# Patient Record
Sex: Female | Born: 1950 | Race: Black or African American | Hispanic: No | Marital: Married | Smoking: Former smoker
Health system: Southern US, Community
[De-identification: ages and names within clinical notes are randomized; demographics above are authoritative.]

## PROBLEM LIST (undated history)

## (undated) DIAGNOSIS — I10 Essential (primary) hypertension: Secondary | ICD-10-CM

## (undated) DIAGNOSIS — R7302 Impaired glucose tolerance (oral): Secondary | ICD-10-CM

## (undated) DIAGNOSIS — G4730 Sleep apnea, unspecified: Secondary | ICD-10-CM

## (undated) DIAGNOSIS — E039 Hypothyroidism, unspecified: Secondary | ICD-10-CM

## (undated) DIAGNOSIS — K219 Gastro-esophageal reflux disease without esophagitis: Secondary | ICD-10-CM

## (undated) DIAGNOSIS — K449 Diaphragmatic hernia without obstruction or gangrene: Secondary | ICD-10-CM

## (undated) DIAGNOSIS — R011 Cardiac murmur, unspecified: Secondary | ICD-10-CM

## (undated) DIAGNOSIS — F419 Anxiety disorder, unspecified: Secondary | ICD-10-CM

## (undated) DIAGNOSIS — M109 Gout, unspecified: Secondary | ICD-10-CM

## (undated) HISTORY — DX: Impaired glucose tolerance (oral): R73.02

## (undated) HISTORY — DX: Hypothyroidism, unspecified: E03.9

---

## 1997-10-09 ENCOUNTER — Emergency Department (HOSPITAL_COMMUNITY): Admission: EM | Admit: 1997-10-09 | Discharge: 1997-10-09 | Payer: Self-pay

## 1997-10-09 ENCOUNTER — Emergency Department (HOSPITAL_COMMUNITY): Admission: EM | Admit: 1997-10-09 | Discharge: 1997-10-09 | Payer: Self-pay | Admitting: Internal Medicine

## 1997-12-05 ENCOUNTER — Emergency Department (HOSPITAL_COMMUNITY): Admission: EM | Admit: 1997-12-05 | Discharge: 1997-12-05 | Payer: Self-pay

## 1998-07-29 ENCOUNTER — Encounter: Admission: RE | Admit: 1998-07-29 | Discharge: 1998-07-29 | Payer: Self-pay | Admitting: Internal Medicine

## 1998-10-06 ENCOUNTER — Emergency Department (HOSPITAL_COMMUNITY): Admission: EM | Admit: 1998-10-06 | Discharge: 1998-10-06 | Payer: Self-pay | Admitting: Internal Medicine

## 1998-10-21 ENCOUNTER — Emergency Department (HOSPITAL_COMMUNITY): Admission: EM | Admit: 1998-10-21 | Discharge: 1998-10-22 | Payer: Self-pay | Admitting: Emergency Medicine

## 1998-10-28 ENCOUNTER — Emergency Department (HOSPITAL_COMMUNITY): Admission: EM | Admit: 1998-10-28 | Discharge: 1998-10-28 | Payer: Self-pay | Admitting: Emergency Medicine

## 1998-11-05 ENCOUNTER — Encounter: Admission: RE | Admit: 1998-11-05 | Discharge: 1998-11-05 | Payer: Self-pay | Admitting: Internal Medicine

## 1998-12-15 ENCOUNTER — Encounter: Admission: RE | Admit: 1998-12-15 | Discharge: 1998-12-15 | Payer: Self-pay | Admitting: Internal Medicine

## 1998-12-26 ENCOUNTER — Encounter: Admission: RE | Admit: 1998-12-26 | Discharge: 1998-12-26 | Payer: Self-pay | Admitting: Internal Medicine

## 1999-01-08 ENCOUNTER — Emergency Department (HOSPITAL_COMMUNITY): Admission: EM | Admit: 1999-01-08 | Discharge: 1999-01-08 | Payer: Self-pay

## 1999-01-12 ENCOUNTER — Ambulatory Visit (HOSPITAL_COMMUNITY): Admission: RE | Admit: 1999-01-12 | Discharge: 1999-01-12 | Payer: Self-pay

## 1999-01-19 ENCOUNTER — Emergency Department (HOSPITAL_COMMUNITY): Admission: EM | Admit: 1999-01-19 | Discharge: 1999-01-19 | Payer: Self-pay | Admitting: Emergency Medicine

## 1999-01-21 ENCOUNTER — Encounter: Admission: RE | Admit: 1999-01-21 | Discharge: 1999-01-21 | Payer: Self-pay | Admitting: Internal Medicine

## 1999-01-28 ENCOUNTER — Encounter: Admission: RE | Admit: 1999-01-28 | Discharge: 1999-01-28 | Payer: Self-pay | Admitting: Hematology and Oncology

## 1999-02-11 ENCOUNTER — Encounter: Admission: RE | Admit: 1999-02-11 | Discharge: 1999-02-11 | Payer: Self-pay | Admitting: Internal Medicine

## 1999-02-13 ENCOUNTER — Encounter: Admission: RE | Admit: 1999-02-13 | Discharge: 1999-02-13 | Payer: Self-pay | Admitting: Internal Medicine

## 1999-03-10 ENCOUNTER — Encounter: Admission: RE | Admit: 1999-03-10 | Discharge: 1999-03-10 | Payer: Self-pay | Admitting: Internal Medicine

## 1999-08-14 ENCOUNTER — Emergency Department (HOSPITAL_COMMUNITY): Admission: EM | Admit: 1999-08-14 | Discharge: 1999-08-14 | Payer: Self-pay | Admitting: Emergency Medicine

## 1999-08-14 ENCOUNTER — Encounter: Payer: Self-pay | Admitting: Emergency Medicine

## 1999-11-06 ENCOUNTER — Encounter: Admission: RE | Admit: 1999-11-06 | Discharge: 1999-11-06 | Payer: Self-pay | Admitting: Internal Medicine

## 2000-01-18 ENCOUNTER — Encounter: Admission: RE | Admit: 2000-01-18 | Discharge: 2000-01-18 | Payer: Self-pay | Admitting: Internal Medicine

## 2000-02-18 ENCOUNTER — Emergency Department (HOSPITAL_COMMUNITY): Admission: EM | Admit: 2000-02-18 | Discharge: 2000-02-18 | Payer: Self-pay | Admitting: Emergency Medicine

## 2000-03-19 ENCOUNTER — Encounter: Payer: Self-pay | Admitting: Emergency Medicine

## 2000-03-19 ENCOUNTER — Emergency Department (HOSPITAL_COMMUNITY): Admission: EM | Admit: 2000-03-19 | Discharge: 2000-03-19 | Payer: Self-pay | Admitting: Emergency Medicine

## 2000-04-11 ENCOUNTER — Encounter: Admission: RE | Admit: 2000-04-11 | Discharge: 2000-04-11 | Payer: Self-pay | Admitting: Internal Medicine

## 2000-04-29 ENCOUNTER — Emergency Department (HOSPITAL_COMMUNITY): Admission: EM | Admit: 2000-04-29 | Discharge: 2000-04-29 | Payer: Self-pay | Admitting: *Deleted

## 2000-05-13 ENCOUNTER — Ambulatory Visit (HOSPITAL_COMMUNITY): Admission: RE | Admit: 2000-05-13 | Discharge: 2000-05-13 | Payer: Self-pay | Admitting: Gastroenterology

## 2000-05-13 ENCOUNTER — Encounter: Payer: Self-pay | Admitting: Gastroenterology

## 2000-05-19 ENCOUNTER — Other Ambulatory Visit: Admission: RE | Admit: 2000-05-19 | Discharge: 2000-05-19 | Payer: Self-pay | Admitting: Obstetrics

## 2000-05-19 ENCOUNTER — Encounter: Admission: RE | Admit: 2000-05-19 | Discharge: 2000-05-19 | Payer: Self-pay | Admitting: Obstetrics

## 2000-05-23 ENCOUNTER — Encounter: Admission: RE | Admit: 2000-05-23 | Discharge: 2000-05-23 | Payer: Self-pay | Admitting: Internal Medicine

## 2000-06-03 ENCOUNTER — Encounter: Admission: RE | Admit: 2000-06-03 | Discharge: 2000-06-03 | Payer: Self-pay | Admitting: Internal Medicine

## 2000-06-09 ENCOUNTER — Ambulatory Visit (HOSPITAL_COMMUNITY): Admission: RE | Admit: 2000-06-09 | Discharge: 2000-06-09 | Payer: Self-pay | Admitting: Gastroenterology

## 2000-07-11 ENCOUNTER — Encounter: Admission: RE | Admit: 2000-07-11 | Discharge: 2000-07-11 | Payer: Self-pay | Admitting: Internal Medicine

## 2000-11-21 ENCOUNTER — Encounter: Admission: RE | Admit: 2000-11-21 | Discharge: 2000-11-21 | Payer: Self-pay | Admitting: Internal Medicine

## 2000-11-28 ENCOUNTER — Encounter: Admission: RE | Admit: 2000-11-28 | Discharge: 2000-11-28 | Payer: Self-pay | Admitting: Internal Medicine

## 2000-12-19 ENCOUNTER — Emergency Department (HOSPITAL_COMMUNITY): Admission: EM | Admit: 2000-12-19 | Discharge: 2000-12-19 | Payer: Self-pay | Admitting: Emergency Medicine

## 2000-12-19 ENCOUNTER — Encounter: Payer: Self-pay | Admitting: Emergency Medicine

## 2000-12-23 ENCOUNTER — Emergency Department (HOSPITAL_COMMUNITY): Admission: EM | Admit: 2000-12-23 | Discharge: 2000-12-23 | Payer: Self-pay | Admitting: Emergency Medicine

## 2001-01-05 ENCOUNTER — Encounter: Admission: RE | Admit: 2001-01-05 | Discharge: 2001-01-05 | Payer: Self-pay | Admitting: Internal Medicine

## 2001-01-21 ENCOUNTER — Emergency Department (HOSPITAL_COMMUNITY): Admission: EM | Admit: 2001-01-21 | Discharge: 2001-01-21 | Payer: Self-pay | Admitting: Emergency Medicine

## 2001-03-01 ENCOUNTER — Encounter: Admission: RE | Admit: 2001-03-01 | Discharge: 2001-03-01 | Payer: Self-pay | Admitting: Internal Medicine

## 2001-03-27 ENCOUNTER — Encounter: Admission: RE | Admit: 2001-03-27 | Discharge: 2001-03-27 | Payer: Self-pay | Admitting: Internal Medicine

## 2001-06-28 ENCOUNTER — Encounter: Admission: RE | Admit: 2001-06-28 | Discharge: 2001-06-28 | Payer: Self-pay | Admitting: Internal Medicine

## 2001-09-15 ENCOUNTER — Encounter: Admission: RE | Admit: 2001-09-15 | Discharge: 2001-09-15 | Payer: Self-pay | Admitting: Internal Medicine

## 2001-09-15 ENCOUNTER — Encounter: Payer: Self-pay | Admitting: Internal Medicine

## 2002-03-22 ENCOUNTER — Emergency Department (HOSPITAL_COMMUNITY): Admission: EM | Admit: 2002-03-22 | Discharge: 2002-03-22 | Payer: Self-pay | Admitting: Emergency Medicine

## 2002-08-07 ENCOUNTER — Encounter: Admission: RE | Admit: 2002-08-07 | Discharge: 2002-08-07 | Payer: Self-pay | Admitting: Internal Medicine

## 2002-08-07 ENCOUNTER — Encounter: Payer: Self-pay | Admitting: Internal Medicine

## 2002-09-12 ENCOUNTER — Encounter: Payer: Self-pay | Admitting: Internal Medicine

## 2002-09-12 ENCOUNTER — Encounter: Admission: RE | Admit: 2002-09-12 | Discharge: 2002-09-12 | Payer: Self-pay | Admitting: Internal Medicine

## 2002-09-17 ENCOUNTER — Emergency Department (HOSPITAL_COMMUNITY): Admission: EM | Admit: 2002-09-17 | Discharge: 2002-09-17 | Payer: Self-pay | Admitting: Emergency Medicine

## 2002-12-17 ENCOUNTER — Emergency Department (HOSPITAL_COMMUNITY): Admission: EM | Admit: 2002-12-17 | Discharge: 2002-12-17 | Payer: Self-pay | Admitting: Emergency Medicine

## 2004-04-22 ENCOUNTER — Ambulatory Visit (HOSPITAL_BASED_OUTPATIENT_CLINIC_OR_DEPARTMENT_OTHER): Admission: RE | Admit: 2004-04-22 | Discharge: 2004-04-22 | Payer: Self-pay | Admitting: Internal Medicine

## 2004-06-23 ENCOUNTER — Ambulatory Visit: Payer: Self-pay | Admitting: Internal Medicine

## 2005-08-25 ENCOUNTER — Emergency Department (HOSPITAL_COMMUNITY): Admission: EM | Admit: 2005-08-25 | Discharge: 2005-08-26 | Payer: Self-pay | Admitting: Emergency Medicine

## 2006-04-08 ENCOUNTER — Emergency Department (HOSPITAL_COMMUNITY): Admission: EM | Admit: 2006-04-08 | Discharge: 2006-04-09 | Payer: Self-pay | Admitting: Emergency Medicine

## 2006-05-31 ENCOUNTER — Ambulatory Visit (HOSPITAL_BASED_OUTPATIENT_CLINIC_OR_DEPARTMENT_OTHER): Admission: RE | Admit: 2006-05-31 | Discharge: 2006-05-31 | Payer: Self-pay | Admitting: Internal Medicine

## 2006-06-05 ENCOUNTER — Ambulatory Visit: Payer: Self-pay | Admitting: Internal Medicine

## 2006-10-04 ENCOUNTER — Emergency Department (HOSPITAL_COMMUNITY): Admission: EM | Admit: 2006-10-04 | Discharge: 2006-10-04 | Payer: Self-pay | Admitting: Family Medicine

## 2006-11-27 ENCOUNTER — Emergency Department (HOSPITAL_COMMUNITY): Admission: EM | Admit: 2006-11-27 | Discharge: 2006-11-27 | Payer: Self-pay | Admitting: Emergency Medicine

## 2006-12-13 ENCOUNTER — Emergency Department (HOSPITAL_COMMUNITY): Admission: EM | Admit: 2006-12-13 | Discharge: 2006-12-13 | Payer: Self-pay | Admitting: Emergency Medicine

## 2007-02-09 ENCOUNTER — Ambulatory Visit (HOSPITAL_COMMUNITY): Admission: RE | Admit: 2007-02-09 | Discharge: 2007-02-09 | Payer: Self-pay | Admitting: Gastroenterology

## 2007-02-10 ENCOUNTER — Encounter: Admission: RE | Admit: 2007-02-10 | Discharge: 2007-02-10 | Payer: Self-pay | Admitting: Gastroenterology

## 2007-03-02 ENCOUNTER — Emergency Department (HOSPITAL_COMMUNITY): Admission: EM | Admit: 2007-03-02 | Discharge: 2007-03-02 | Payer: Self-pay | Admitting: Family Medicine

## 2009-05-12 ENCOUNTER — Emergency Department (HOSPITAL_COMMUNITY): Admission: EM | Admit: 2009-05-12 | Discharge: 2009-05-12 | Payer: Self-pay | Admitting: Emergency Medicine

## 2009-06-14 ENCOUNTER — Emergency Department (HOSPITAL_COMMUNITY): Admission: EM | Admit: 2009-06-14 | Discharge: 2009-06-14 | Payer: Self-pay | Admitting: Emergency Medicine

## 2009-06-19 ENCOUNTER — Emergency Department (HOSPITAL_COMMUNITY): Admission: EM | Admit: 2009-06-19 | Discharge: 2009-06-19 | Payer: Self-pay | Admitting: Family Medicine

## 2009-06-24 ENCOUNTER — Ambulatory Visit: Payer: Self-pay | Admitting: Family Medicine

## 2009-06-24 DIAGNOSIS — R230 Cyanosis: Secondary | ICD-10-CM | POA: Insufficient documentation

## 2009-06-24 DIAGNOSIS — I10 Essential (primary) hypertension: Secondary | ICD-10-CM

## 2009-06-26 ENCOUNTER — Telehealth: Payer: Self-pay | Admitting: Family Medicine

## 2009-07-28 ENCOUNTER — Ambulatory Visit: Payer: Self-pay | Admitting: Family Medicine

## 2009-07-28 DIAGNOSIS — M549 Dorsalgia, unspecified: Secondary | ICD-10-CM | POA: Insufficient documentation

## 2009-07-28 DIAGNOSIS — L299 Pruritus, unspecified: Secondary | ICD-10-CM

## 2009-09-23 ENCOUNTER — Telehealth: Payer: Self-pay | Admitting: *Deleted

## 2009-12-23 ENCOUNTER — Telehealth: Payer: Self-pay | Admitting: *Deleted

## 2009-12-30 ENCOUNTER — Encounter: Payer: Self-pay | Admitting: Family Medicine

## 2009-12-30 ENCOUNTER — Ambulatory Visit: Payer: Self-pay | Admitting: Family Medicine

## 2009-12-30 DIAGNOSIS — F329 Major depressive disorder, single episode, unspecified: Secondary | ICD-10-CM

## 2009-12-30 DIAGNOSIS — F32A Depression, unspecified: Secondary | ICD-10-CM | POA: Insufficient documentation

## 2009-12-30 LAB — CONVERTED CEMR LAB: TSH: 32.38 microintl units/mL — ABNORMAL HIGH (ref 0.350–4.500)

## 2010-01-01 ENCOUNTER — Encounter: Payer: Self-pay | Admitting: Family Medicine

## 2010-02-23 ENCOUNTER — Telehealth: Payer: Self-pay | Admitting: *Deleted

## 2010-02-25 ENCOUNTER — Ambulatory Visit: Payer: Self-pay | Admitting: Family Medicine

## 2010-02-25 DIAGNOSIS — E669 Obesity, unspecified: Secondary | ICD-10-CM

## 2010-03-23 ENCOUNTER — Encounter (INDEPENDENT_AMBULATORY_CARE_PROVIDER_SITE_OTHER): Payer: Self-pay | Admitting: *Deleted

## 2010-04-02 ENCOUNTER — Ambulatory Visit: Payer: Self-pay | Admitting: Family Medicine

## 2010-04-02 DIAGNOSIS — R109 Unspecified abdominal pain: Secondary | ICD-10-CM

## 2010-04-02 LAB — CONVERTED CEMR LAB
Bilirubin Urine: NEGATIVE
Glucose, Urine, Semiquant: NEGATIVE
Pap Smear: NEGATIVE
Urobilinogen, UA: 0.2
WBC Urine, dipstick: NEGATIVE
pH: 5.5

## 2010-04-07 ENCOUNTER — Ambulatory Visit (HOSPITAL_COMMUNITY)
Admission: RE | Admit: 2010-04-07 | Discharge: 2010-04-07 | Payer: Self-pay | Source: Home / Self Care | Admitting: Family Medicine

## 2010-04-09 ENCOUNTER — Encounter: Payer: Self-pay | Admitting: Family Medicine

## 2010-04-09 ENCOUNTER — Ambulatory Visit: Payer: Self-pay | Admitting: Family Medicine

## 2010-04-09 LAB — CONVERTED CEMR LAB
BUN: 16 mg/dL (ref 6–23)
Chloride: 100 meq/L (ref 96–112)
Creatinine, Ser: 0.8 mg/dL (ref 0.40–1.20)
Glucose, Bld: 112 mg/dL — ABNORMAL HIGH (ref 70–99)
Sodium: 139 meq/L (ref 135–145)
TSH: 19.202 microintl units/mL — ABNORMAL HIGH (ref 0.350–4.500)

## 2010-04-10 ENCOUNTER — Encounter: Payer: Self-pay | Admitting: Family Medicine

## 2010-04-10 ENCOUNTER — Ambulatory Visit: Payer: Self-pay | Admitting: Family Medicine

## 2010-04-10 DIAGNOSIS — E039 Hypothyroidism, unspecified: Secondary | ICD-10-CM

## 2010-04-13 ENCOUNTER — Encounter: Payer: Self-pay | Admitting: Family Medicine

## 2010-05-21 ENCOUNTER — Telehealth: Payer: Self-pay | Admitting: Family Medicine

## 2010-05-24 ENCOUNTER — Encounter: Payer: Self-pay | Admitting: Internal Medicine

## 2010-05-24 ENCOUNTER — Encounter: Payer: Self-pay | Admitting: Gastroenterology

## 2010-05-29 ENCOUNTER — Encounter: Payer: Self-pay | Admitting: *Deleted

## 2010-06-02 NOTE — Assessment & Plan Note (Signed)
Summary: multiple issues,df   Vital Signs:  Patient profile:   60 year old female Weight:      252 pounds Temp:     98.5 degrees F oral Pulse rate:   65 / minute Pulse rhythm:   regular BP sitting:   139 / 89  (left arm) Cuff size:   large  Vitals Entered By: Loralee Pacas CMA (December 30, 2009 3:29 PM) CC: multiple issues, Back Pain, Depression   Primary Provider:  Magnus Ivan MD  CC:  multiple issues, Back Pain, and Depression.  History of Present Illness: Patient is a 60 y/o female with obesity. She is suffering from lower back pain. The patient says her back pain is off to the right side of her spine, it does not get worse with palpation of the spine or back muscles. the pain is there when she stands for too long or when she is in a car for prolonged periods of time. She has no fevers, chills or night sweats. she has no shooting pains from her back. she has had no recent trauma.   Back Pain History:      The patient's back pain started approximately 05/02/2009.  The pain is located in the lower back region and does not radiate below the knees.  She states this is not work related.  On a scale of 1-10, she describes the pain as a 5.  She states that she has had a prior history of back pain.  The patient has not had any recent physical therapy for her back pain.  The following makes the back pain better: resting.  The following makes the back pain worse: standing, long car rides.    Depression History:      Positive alarm features for depression include significant weight gain, insomnia, psychomotor retardation, and fatigue (loss of energy).        Risk factors for depression include a recent loss and chronic illness.  The patient denies that she feels like life is not worth living, denies that she wishes that she were dead, and denies that she has thought about ending her life.         Habits & Providers  Alcohol-Tobacco-Diet     Tobacco Status: current     Tobacco  Counseling: to quit use of tobacco products     Cigarette Packs/Day: <0.25  Problems Prior to Update: 1)  Pruritus  (ICD-698.9) 2)  Back Pain, Chronic  (ICD-724.5) 3)  Essential Hypertension, Benign  (ICD-401.1) 4)  Unspecified Disorder of Thyroid  (ICD-246.9) 5)  Cyanosis  (ICD-782.5) 6)  Unspecified Disorder of Skin&subcutaneous Tissue  (ICD-709.9) 7)  Stiffness  (ICD-719.50)  Medications Prior to Update: 1)  Atenolol 50 Mg Tabs (Atenolol) .... Take One Table Two Times A Day 2)  Paroxetine Hcl 40 Mg Tabs (Paroxetine Hcl) .... Take One Pill Once A Day 3)  Synthroid 200 Mcg Tabs (Levothyroxine Sodium) .... Take One Tablet Once A Day 4)  Hydroxyzine Hcl 25 Mg/ml Soln (Hydroxyzine Hcl) .... 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 great aunt with rheumatoid arthritis Family History of Arthritis  Social History: Reviewed history and no changes required. Smoking  Status:  current Packs/Day:  <0.25  Review of Systems       The patient complains of weight gain and muscle weakness.  The patient denies chest pain, syncope, dyspnea on exertion, prolonged cough, headaches, abdominal pain, and unusual weight change.    Physical Exam  General:  Obesehealthy-appearing and good hygiene.   Head:  normocephalic and atraumatic.   Eyes:  pupils reactive to light.   Neck:  supple.   Chest Wall:  no deformities, no tenderness, and no mass.   Lungs:  Normal respiratory effort, chest expands symmetrically. Lungs are clear to auscultation, no crackles or wheezes. Heart:  Normal rate and regular rhythm. S1 and S2  normal without gallop, murmur, click, rub or other extra sounds. Abdomen:  Bowel sounds positive,abdomen soft and non-tender without masses, organomegaly or hernias noted. Msk:  No deformity or scoliosis noted of thoracic or lumbar spine.    Pulses:  R and L carotid,radial,femoral,dorsalis pedis and posterior tibial pulses are full and equal bilaterally Extremities:  No clubbing, cyanosis, edema, or deformity noted with normal full range of motion of all joints.   Neurologic:  No cranial nerve deficits noted. Station and gait are normal. Plantar reflexes are down-going bilaterally. DTRs are symmetrical throughout. Sensory, motor and coordinative functions appear intact. Psych:  Cognition and judgment appear intact. Alert and cooperative with normal attention span and concentration. No apparent delusions, illusions, hallucinations   Impression & Recommendations:  Problem # 1:  BACK PAIN, CHRONIC (ICD-724.5)  Orders: [facility]- Est Level  3 (60454)  Patient advised to lose weight recommended over the counter NSAIDS for pain TSH sent to rule out underlying cause of obesity  Problem # 2:  DEPRESSIVE DISORDER (ICD-311)  Her updated medication list for this problem includes:    Paroxetine Hcl 40 Mg Tabs (Paroxetine hcl) .Marland Kitchen... Take one pill twice a day    Hydroxyzine Hcl 25 Mg/ml Soln (Hydroxyzine hcl) ..... Use q6 hours as needed  Did poorly on her PHQ9. Says she has no drive, is having sleep problems and overeating.  Complete Medication List: 1)  Atenolol 50 Mg Tabs (Atenolol) .... Take one table two times a day 2)  Paroxetine Hcl 40 Mg Tabs (Paroxetine hcl) .... Take one pill twice a day 3)  Synthroid 200 Mcg Tabs (Levothyroxine sodium) .... Take one tablet once a day 4)  Hydroxyzine Hcl 25 Mg/ml Soln (Hydroxyzine hcl) .... Use q6 hours as needed 5)  Lunesta 1 Mg Tabs (Eszopiclone) .... Take one tab at bedtime  Other Orders: TSH-[facility] ([id number])   Patient Instructions: 1)  It was a  pleasure meeting you today. 2)  Please schedule a follow-up appointment in 2 months. 3)  It is important that you exercise regularly at least 20 minutes 5 times a week. If you develop chest pain, have severe difficulty breathing, or feel very tired , stop exercising immediately and seek medical attention. 4)  You need to lose weight. Consider a lower calorie diet and regular exercise.  5)  Take 400-600mg  of Ibuprofen (Advil, Motrin) with food every 4-6 hours as needed for relief of pain or comfort of fever. 6)  Most patients (90%) with low back pain will improve with time (2-6 weeks). Keep active but avoid activities that are painful. Apply moist heat and/or ice to lower back several times a day. 7)  TSH prior to visit, ICD-9: Prescriptions: PAROXETINE HCL 40 MG TABS (PAROXETINE HCL) take one pill twice a day  #60 x 1   Entered  and Authorized by:   Edd Arbour   Signed by:   Edd Arbour on 12/30/2009   Method used:   Printed then faxed to ...         RxID:   [id number] LUNESTA 1 MG TABS (ESZOPICLONE) take one tab at bedtime  #30 x 1   Entered and Authorized by:   Edd Arbour   Signed by:   Edd Arbour on 12/30/2009   Method used:   Print then Give to Patient   RxID:   [id number] PAROXETINE HCL 40 MG TABS (PAROXETINE HCL) take one pill twice a day  #60 x 1   Entered and Authorized by:   Edd Arbour   Signed by:   Edd Arbour on 12/30/2009   Method used:   Print then Give to Patient   RxID:   [id number]

## 2010-06-02 NOTE — Progress Notes (Signed)
Summary: refill  Phone Note Refill Request Call back at Home Phone [phone] Message from:  Patient  Refills Requested: Medication #1:  PAROXETINE HCL 40 MG TABS take one pill once a day  Medication #2:  SYNTHROID 200 MCG TABS take one tablet once a day [facility] [address]  Next Appointment Scheduled: 12/30/09  Follow-up for Phone Call       Follow-up by: Golden Circle RN,  December 23, 2009 10:43 AM    Prescriptions: PAROXETINE HCL 40 MG TABS (PAROXETINE HCL) take one pill once a day  #30 x 0   Entered by:   Golden Circle RN   Authorized by:   Sarah Swaziland MD   Signed by:   Golden Circle RN on 12/23/2009   Method used:   Electronically to        [facility] [address] (retail)       [address]       [place], [place]  [address]       Ph: [phone]       Fax: [phone]   RxID:   [id number] SYNTHROID 200 MCG TABS (LEVOTHYROXINE SODIUM) take one tablet once a day  #30 x 0   Entered by:   Golden Circle RN   Authorized by:   Sarah Swaziland MD   Signed by:   Golden Circle RN on 12/23/2009   Method used:   Electronically to        [facility] [address] (retail)       [address]       [place], [place]  [address]       Ph: [phone]       Fax: [phone]   RxID:   [id number]

## 2010-06-02 NOTE — Assessment & Plan Note (Signed)
Summary: per strother/eo   Vital Signs:  Patient profile:   60 year old female Height:      64.5 inches Weight:      255.1 pounds BMI:     43.27 Temp:     98.3 degrees F oral Pulse rate:   77 / minute BP sitting:   143 / 83  (left arm) Cuff size:   large  Vitals Entered By: Garen Grams LPN (April 02, 2010 10:06 AM) CC: ongoing pressure in pelvic area with frequent urination Is Patient Diabetic? No Pain Assessment Patient in pain? no        Primary Care Oshen Wlodarczyk:  Edd Arbour  CC:  ongoing pressure in pelvic area with frequent urination.  History of Present Illness: Pt complains that she has been having lots of pelvic pressure for several months. She says she feels pressure both in the front of her pelvis and in the back.  It does not radiate anywhere.  She also complains that for about one month she has been having frequent urination, and that at night she has to go about every 10 minutes.  She says there is actually urine each time she has to go.  She does not have pain or burning with urination, but has had vaginal irritation with washing.  She has not had any bleeding since her periods stopped about three years ago.   Pt is a [id number], her last pregnancy was about 15 years ago.  She says she miscarried and had to have a D&C.  She also had to abortions years before. She says that when she had the D&C she had an ultrasound that showed fibroids.   Pt. has also had increasing constipation for the past few months, but no blood in her stool or dark tarry stools.  She has taken milk of magnesia to help with the constipation.    Habits & Providers  Alcohol-Tobacco-Diet     Tobacco Status: current     Tobacco Counseling: to quit use of tobacco products     Cigarette Packs/Day: <0.25  Allergies: No Known Drug Allergies  Review of Systems       See HPI.   Physical Exam  General:  Well-developed,well-nourished,in no acute distress; alert,appropriate and cooperative  throughout examination Rectal:  no external abnormalities.   Genitalia:  Normal introitus for age, no external lesions,small amount of mucous discharge with scant blood, mucosa pink and moist, no vaginal or cervical lesions, no vaginal atrophy, no friaility or hemorrhage, normal uterus size and position, no adnexal masses or tenderness   Impression & Recommendations:  Problem # 1:  PELVIC PAIN, CHRONIC (ICD-789.09) Orders: Ultrasound (Ultrasound) Pap Smear-[facility] ([id number]) [facility]- Est  Level 4 (63016)  Problem # 2:  DYSURIA (ICD-788.1) Orders: Urine Culture-[facility] ([id number]) [facility]- Est  Level 4 (99214)we will get above tests and have her rtc next week for follow up  Complete Medication List: 1)  Atenolol 50 Mg Tabs (Atenolol) .... Take one table two times a day 2)  Paroxetine Hcl 40 Mg Tabs (Paroxetine hcl) .... Take one pill twice a day 3)  Synthroid 200 Mcg Tabs (Levothyroxine sodium) .... Take one tablet once a day 4)  Hydroxyzine Hcl 25 Mg/ml Soln (Hydroxyzine hcl) .... Use q6 hours as needed 5)  Lunesta 1 Mg Tabs (Eszopiclone) .... Take one tab at bedtime 6)  Xenical 120 Mg Caps (Orlistat) .... Take 1 pill at every fatty meal. 7)  Ibuprofen 800 Mg Tabs (Ibuprofen) .... Take 1 tab every  8 hours as needed for pain. eat something with it every time. Urinalysis-[facility] (00000) Ultrasound (Ultrasound) Pap Smear-[facility] ([id number]) Urine Culture-[facility] ([id number]) [facility]- Est  Level 4 (29562)  Patient Instructions: 1)  Please schedukle her back in [facility] next Thursday Dec 9th   Orders Added: 1)  Urinalysis-[facility] [00000] 2)  Ultrasound [Ultrasound] 3)  Pap Smear-[facility] [[id number]] 4)  Urine Culture-[facility] [[id number]] 5)  [facility]- Est  Level 4 [99214]    Laboratory Results   Urine Tests  Date/Time Received: April 02, 2010 10:08 AM  Date/Time Reported: April 02, 2010 10:34 AM   Routine Urinalysis   Color: yellow Appearance: Clear Glucose: negative   (Normal Range:  Negative) Bilirubin: negative   (Normal Range: Negative) Ketone: negative   (Normal Range: Negative) Spec. Gravity: 1.025   (Normal Range: 1.003-1.035) Blood: moderate   (Normal Range: Negative) pH: 5.5   (Normal Range: 5.0-8.0) Protein: negative   (Normal Range: Negative) Urobilinogen: 0.2   (Normal Range: 0-1) Nitrite: negative   (Normal Range: Negative) Leukocyte Esterace: negative   (Normal Range: Negative)  Urine Microscopic WBC/HPF: 0-2 RBC/HPF: 1-5 Bacteria/HPF: trace Mucous/HPF: trace Epithelial/HPF: 0-3    Comments: ...........test performed by...........Marland KitchenTerese Door, CMA       Impression & Recommendations:  Her updated medication list for this problem includes:    Ibuprofen 800 Mg Tabs (Ibuprofen) .Marland Kitchen... Take 1 tab every 8 hours as needed for pain. eat something with it every time.  Orders: Ultrasound (Ultrasound) Pap Smear-[facility] ([id number])   Orders: Ultrasound (Ultrasound) Pap Smear-[facility] ([id number]) [facility]- Est  Level 4 (25956)    Orders: Urine Culture-[facility] ([id number])   Orders: Urine Culture-[facility] ([id number]) [facility]- Est  Level 4 (30160)    Complete Medication List: 1)  Atenolol 50 Mg Tabs (Atenolol) .... Take one table two times a day 2)  Paroxetine Hcl 40 Mg Tabs (Paroxetine hcl) .... Take one pill twice a day 3)  Synthroid 200 Mcg Tabs (Levothyroxine sodium) .... Take one tablet once a day 4)  Hydroxyzine Hcl 25 Mg/ml Soln (Hydroxyzine hcl) .... Use q6 hours as needed 5)  Lunesta 1 Mg Tabs (Eszopiclone) .... Take one tab at bedtime 6)  Xenical 120 Mg Caps (Orlistat) .... Take 1 pill at every fatty meal. 7)  Ibuprofen 800 Mg Tabs (Ibuprofen) .... Take 1 tab every 8 hours as needed for pain. eat something with it every time.  Other Orders: Urinalysis-[facility] (00000)

## 2010-06-02 NOTE — Assessment & Plan Note (Signed)
Summary: shoulder tightness, low back pain, obesity   Vital Signs:  Patient profile:   60 year old female Weight:      252.5 pounds Temp:     98 degrees F oral Pulse rate:   69 / minute Pulse rhythm:   regular BP sitting:   137 / 96  (left arm) Cuff size:   large  Vitals Entered By: Loralee Pacas CMA (February 25, 2010 3:25 PM)  CC: neck pain Pain Assessment Patient in pain? yes     Location: lower back Intensity: 7   Primary Care Provider:  Edd Arbour  CC:  neck pain.  History of Present Illness: Neck pain: Pt has had Rt sided neck pain for the last 2 months. She has tried massage and arthritis pain meds but it has not gotten significantly better. She has not tried a heating pad or NSAID's. She sleeps on her Rt shoulder every night and thinks this may have something to do with it.   Low back pain: This is a chronic issue of 3-4 years. She has low back pain. She has been told that it will decrease with weight loss. Feels like the weight is just "pulling my back forward". Wants to discuss weight loss.  Obesity: Pt wants to try something for weight loss. She wants to discuss Xenical because she has used it successfully int he past. She also wants to hear about diet pills. We discussed that we do not use diet pills at this office but that I was willing to do a trial of Xenical again to see if it can help her lose some weight.   Current Medications (verified): 1)  Atenolol 50 Mg Tabs (Atenolol) .... Take One Table Two Times A Day 2)  Paroxetine Hcl 40 Mg Tabs (Paroxetine Hcl) .... Take One Pill Twice A Day 3)  Synthroid 200 Mcg Tabs (Levothyroxine Sodium) .... Take One Tablet Once A Day 4)  Hydroxyzine Hcl 25 Mg/ml Soln (Hydroxyzine Hcl) .... Use Q6 Hours As Needed 5)  Lunesta 1 Mg Tabs (Eszopiclone) .... Take One Tab At Bedtime 6)  Xenical 120 Mg Caps (Orlistat) .... Take 1 Pill At Every Fatty Meal. 7)  Ibuprofen 800 Mg Tabs (Ibuprofen) .... Take 1 Tab Every 8 Hours As  Needed For Pain. Eat Something With It Every Time.  Allergies (verified): No Known Drug Allergies  Review of Systems        vitals reviewed and pertinent negatives and positives seen in HPI   Physical Exam  General:  Well-developed,well-nourished,in no acute distress; alert,appropriate and cooperative throughout examination, obese Neck:  Rt neck muscles are tight, no tinging or numbness Msk:  PT has some Rt sided low back tenderness to palpation. pt has pain with leaning back but feels fine when leaning forward.    Impression & Recommendations:  Problem # 1:  STIFFNESS (ICD-719.50) Assessment New Shoulder stiffness that I think is coming from her sleeping on her shoulder. SHe is not sleeping well at night and she is suppose to be sleeping with a CPAP on but says she doesn't like it at night. Perhaps she will keep the CPAP on and lie flat in the bed if she were to sleep more deeply. Will try lunesta to help her sleep more soundly.   Orders: [facility]- Est Level  3 (16109)  Problem # 2:  BACK PAIN, CHRONIC (ICD-724.5) Assessment: Unchanged THis is a chronic issue. Pt may have some facet joint arthritis. Could consider an x-ray in the future.  Will havae her f/u with her PCP on this matter. Encouraged weight loss.   Her updated medication list for this problem includes:    Ibuprofen 800 Mg Tabs (Ibuprofen) .Marland Kitchen... Take 1 tab every 8 hours as needed for pain. eat something with it every time.  Orders: [facility]- Est Level  3 (42595)  Problem # 3:  OBESITY, UNSPECIFIED (ICD-278.00) Assessment: Unchanged PT is 252.5 lbs and wants to lose some of the weight. She has used Xenical in the past to lose some weight. Will give her a trial of this medicine again. Warned her that it is expensive  and that if she takes it she needs to come back in for liver function testing in about 1 month. Pt agrees.   Orders: [facility]- Est Level  3 (99213)Future Orders: Hepatic-[facility] ([id number]) ... 02/24/2011  Complete  Medication List: 1)  Atenolol 50 Mg Tabs (Atenolol) .... Take one table two times a day 2)  Paroxetine Hcl 40 Mg Tabs (Paroxetine hcl) .... Take one pill twice a day 3)  Synthroid 200 Mcg Tabs (Levothyroxine sodium) .... Take one tablet once a day 4)  Hydroxyzine Hcl 25 Mg/ml Soln (Hydroxyzine hcl) .... Use q6 hours as needed 5)  Lunesta 1 Mg Tabs (Eszopiclone) .... Take one tab at bedtime 6)  Xenical 120 Mg Caps (Orlistat) .... Take 1 pill at every fatty meal. 7)  Ibuprofen 800 Mg Tabs (Ibuprofen) .... Take 1 tab every 8 hours as needed for pain. eat something with it every time.  Patient Instructions: 1)  Please make an appoinment with Dr. Gerilyn Pilgrim for nutrition consult.  2)  Use the Xenical as needed on your most oily meals.  3)  Come back in 1 month to check liver function tests since you are taking Xenical.  4)  Try to sleep on your back using the  Lunesta and CPAP.  5)  It may improve your shoulder muscle tightness.  6)  Try a heating pad or heating up moist towels (warm) putting them on the neck before bed.  7)  Make an appointment up from at [facility].  Prescriptions: IBUPROFEN 800 MG TABS (IBUPROFEN) take 1 tab every 8 hours as needed for pain. eat something with it every time.  #60 x 1   Entered and Authorized by:   Jamie Brookes MD   Signed by:   Jamie Brookes MD on 02/25/2010   Method used:   Electronically to        [facility] [address] (retail)       [address]       [place], [place]  [id number]       Ph: [phone]       Fax: [phone]   RxID:   [id number] XENICAL 120 MG CAPS (ORLISTAT) take 1 pill at every fatty meal.  #90 x 3   Entered and Authorized by:   Jamie Brookes MD   Signed by:   Jamie Brookes MD on 02/25/2010   Method used:   Print then Give to Patient   RxID:   [id number]    Orders Added: 1)  Hepatic-[facility] [[id number]] 2)  [facility]- Est Level  3 [85462]

## 2010-06-02 NOTE — Progress Notes (Signed)
Summary: Rx Req  Phone Note Call from Patient Call back at Home Phone [phone]   Caller: Patient Summary of Call: Dr. Mayford Knife this pt said you were going to call in several of her meds for her to [facility].   Initial call taken by: Clydell Hakim,  June 26, 2009 3:02 PM  Follow-up for Phone Call        filled prescriptions for atenolol, paroxetine and synthroid Follow-up by: Magnus Ivan MD,  June 27, 2009 11:35 AM    New/Updated Medications: ATENOLOL 50 MG TABS (ATENOLOL) take one table two times a day PAROXETINE HCL 40 MG TABS (PAROXETINE HCL) take one pill once a day SYNTHROID 200 MCG TABS (LEVOTHYROXINE SODIUM) take one tablet once a day Prescriptions: SYNTHROID 200 MCG TABS (LEVOTHYROXINE SODIUM) take one tablet once a day  #30 x 0   Entered and Authorized by:   Magnus Ivan MD   Signed by:   Magnus Ivan MD on 06/27/2009   Method used:   Electronically to        [facility] [address] [address] (retail)       [address]       [place], [place]  [id number]       Ph: [phone]       Fax: [phone]   RxID:   [id number] PAROXETINE HCL 40 MG TABS (PAROXETINE HCL) take one pill once a day  #30 x 2   Entered and Authorized by:   Magnus Ivan MD   Signed by:   Magnus Ivan MD on 06/27/2009   Method used:   Electronically to        [facility] (retail)       [address]       [place], [place]  [address]       Ph: [phone]       Fax: [phone]   RxID:   [id number] ATENOLOL 50 MG TABS (ATENOLOL) take one table two times a day  #60 x 2   Entered and Authorized by:   Magnus Ivan MD   Signed by:   Magnus Ivan MD on 06/27/2009   Method used:   Electronically to        [facility] [address] (retail)       [address]       [place], [place]  [id number]       Ph: [phone]       Fax: [phone]   RxID:   [id number]

## 2010-06-02 NOTE — Progress Notes (Signed)
Summary: refill  Phone Note Refill Request Call back at Home Phone [phone] Message from:  Patient  Refills Requested: Medication #1:  PAROXETINE HCL 40 MG TABS take one pill once a day  Medication #2:  SYNTHROID 200 MCG TABS take one tablet once a day  Medication #3:  HYDROXYZINE HCL 25 MG/ML SOLN use q6 hours as needed.  Medication #4:  ATENOLOL 50 MG TABS take one table two times a day [facility]   Next Appointment Scheduled: 6/9 Initial call taken by: De Nurse,  Sep 23, 2009 2:03 PM  Follow-up for Phone Call       Follow-up by: Golden Circle RN,  Sep 23, 2009 2:22 PM    Prescriptions: HYDROXYZINE HCL 25 MG/ML SOLN (HYDROXYZINE HCL) use q6 hours as needed  #30 x 0   Entered by:   Golden Circle RN   Authorized by:   Magnus Ivan MD   Signed by:   Golden Circle RN on 09/23/2009   Method used:   Electronically to        [facility] (retail)       [address]       [place], [place]  [id number]       Ph: [phone]       Fax: [phone]   RxID:   [id number] SYNTHROID 200 MCG TABS (LEVOTHYROXINE SODIUM) take one tablet once a day  #30 x 0   Entered by:   Golden Circle RN   Authorized by:   Magnus Ivan MD   Signed by:   Golden Circle RN on 09/23/2009   Method used:   Electronically to        [facility] (retail)       [address]       [place], [place]  [id number]       Ph: [phone]       Fax: [phone]   RxID:   [id number] PAROXETINE HCL 40 MG TABS (PAROXETINE HCL) take one pill once a day  #30 x 0   Entered by:   Golden Circle RN   Authorized by:   Magnus Ivan MD   Signed by:   Golden Circle RN on 09/23/2009   Method used:   Electronically to        [facility] (retail)       [address]       [place], [place]  [address]       Ph: [phone]       Fax: [phone]   RxID:   [id number] ATENOLOL 50 MG TABS (ATENOLOL)  take one table two times a day  #60 x 0   Entered by:   Golden Circle RN   Authorized by:   Magnus Ivan MD   Signed by:   Golden Circle RN on 09/23/2009   Method used:   Electronically to        [facility] (retail)       [address]       [place], [place]  [address]       Ph: [phone]       Fax: [phone]   RxID:   [id number]

## 2010-06-02 NOTE — Assessment & Plan Note (Signed)
Summary: f/u tcb   Vital Signs:  Patient profile:   60 year old female Height:      64.5 inches Weight:      247.44 pounds BMI:     41.97 Temp:     98.0 degrees F oral Pulse rate:   76 / minute BP sitting:   124 / 87  (right arm)  Vitals Entered By: Terese Door (July 28, 2009 2:04 PM) CC: F/U Is Patient Diabetic? No Pain Assessment Patient in pain? yes     Location: back Intensity: 7 Type: aching   Primary Care Provider:  Magnus Ivan MD  CC:  F/U.  History of Present Illness: 60 y/o female here c/o back pain and itching  back pain- chronic issue. worse with standing for long periods, increased activity. no numbness, tingling, paresthesias, loss of bowel or bladder continence, difficulty walking. improved with tylenol arthritis. no improvement with icy hot. occasional feels like it "wraps around" to inguinal area. no vaginal discharge, no longer has menstrual cycle. pain can be improved with BM.   itching- improved with hydroxyzine and benadryl, but they cause her to be sleepy. would like alternative.   Habits & Providers  Alcohol-Tobacco-Diet     Tobacco Status: quit < 6 months  Current Medications (verified): 1)  Atenolol 50 Mg Tabs (Atenolol) .... Take One Table Two Times A Day 2)  Paroxetine Hcl 40 Mg Tabs (Paroxetine Hcl) .... Take One Pill Once A Day 3)  Synthroid 200 Mcg Tabs (Levothyroxine Sodium) .... Take One Tablet Once A Day 4)  Hydroxyzine Hcl 25 Mg/ml Soln (Hydroxyzine Hcl) .... Use Q6 Hours As Needed  Allergies (verified): No Known Drug Allergies  Past History:  Past medical history reviewed for relevance to current acute and chronic problems.  Past Medical History: Reviewed history from 06/24/2009 and no changes required. 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 vitals reviewed.  Msk:  mild TTP in area of sacroiliac joints bilaterally. no  paraspinous muscle tenderness. normal gait, heel walking, toe walking. full ROM of spine. pain worse with lateral movement. negative straight leg raise bilaterally.  Neurologic:  alert & oriented X3, cranial nerves II-XII intact, and gait normal.     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 patient given rehab exercises, encouraged to use ibuprofen and/or acetaminophen for pain. f/u in 6 weeks for pelvic to r/o pelvic causes given occasional inguinal pain.   Orders: [facility]- Est Level  3 ([address])  Problem # 2:  PRURITUS (ICD-698.9) Assessment: New  etiology unclear. recommend non-sedating anti-histamine (loratadine, cetirizine, etc).   Orders: [facility]- Est Level  3 ([address])  Complete Medication List: 1)  Atenolol 50 Mg Tabs (Atenolol) .... Take one table two times a day 2)  Paroxetine Hcl 40 Mg Tabs (Paroxetine hcl) .... Take one pill once a day 3)  Synthroid 200 Mcg Tabs (Levothyroxine sodium) .... Take one tablet once a day 4)  Hydroxyzine Hcl 25 Mg/ml Soln (Hydroxyzine hcl) .... Use q6 hours as needed  Patient Instructions: 1)  Follow up in 6 weeks for complete physical and to see how your back is doing.  2)  You can take IBUPROFEN 200 mg tablets- up to 3 tablets every 6  hours for pain 3)  You can take ACETAMINOPHEN (Tylenol) 500 mg- up to 2 tablets every 6 hours.  4)  You can take BOTH (they work differently) 5)  Do the back exercises that I give you.

## 2010-06-02 NOTE — Assessment & Plan Note (Signed)
Summary: f/u eo   Vital Signs:  Patient profile:   60 year old female Height:      64.5 inches Weight:      252.3 pounds BMI:     42.79 Temp:     98.4 degrees F oral Pulse rate:   69 / minute BP sitting:   132 / 90  (right arm) Cuff size:   large  Vitals Entered By: Jimmy Footman, CMA (April 10, 2010 3:35 PM) CC: thyroid follow up   Primary Provider:  Edd Arbour  CC:  thyroid follow up.  History of Present Illness: 1. Hypothyroidism - TSH high 19 - patient has been off synthroid for a few weeks, she just refilled her medication. She will continue to take the 200 micrograms dose for 6 weeks and then return for a recheck of her TSH. She has fatigue, obesity, dry skin - all symptoms of hypothyroidism.  no other signs or symptoms  2. Back pain - ibuprofen causing stomach pain, will switch her to Mobic 7.5 q 6 hrs.  Preventive Screening-Counseling & Management  Alcohol-Tobacco     Smoking Status: current  Allergies: No Known Drug Allergies  Review of Systems       neg. see HPI  Physical Exam  General:  alert, cooperative to examination, and overweight-appearing.   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 Take one tablet once a day  Orders: [facility]- Est Level  3 ([address])  Problem # 2:  BACK PAIN, CHRONIC (ICD-724.5)  The following medications were removed from the medication list:    Ibuprofen 800 Mg Tabs (Ibuprofen) .Marland Kitchen... Take 1 tab every 8 hours as needed for pain. eat something with it every time. Her updated medication list for this problem includes:    Meloxicam 7.5 Mg Tabs (Meloxicam) .Marland Kitchen... Take one tab every 6 hours as needed for pain  Complete Medication List: 1)  Atenolol 50 Mg Tabs (Atenolol) .... Take one table two times a day 2)  Paroxetine Hcl 40 Mg Tabs (Paroxetine hcl) .... Take  one pill twice a day 3)  Synthroid 200 Mcg Tabs (Levothyroxine sodium) .... Take one tablet once a day 4)  Hydroxyzine Hcl 25 Mg/ml Soln (Hydroxyzine hcl) .... Use q6 hours as needed 5)  Lunesta 1 Mg Tabs (Eszopiclone) .... Take one tab at bedtime 6)  Xenical 120 Mg Caps (Orlistat) .... Take 1 pill at every fatty meal. 7)  Meloxicam 7.5 Mg Tabs (Meloxicam) .... Take one tab every 6 hours as needed for pain  Patient Instructions: 1)  follow up in 6 weeks. Prescriptions: MELOXICAM 7.5 MG TABS (MELOXICAM) take one tab every 6 hours as needed for pain  #30 x 5   Entered and Authorized by:   Edd Arbour   Signed by:   Edd Arbour on 04/10/2010   Method used:   Electronically to        [facility] (retail)       [address]       [place], [place]  [id number]       Ph: [phone]       Fax: [phone]   RxID:   [id number]    Orders Added: 1)  [facility]- Est Level  3 [[address]]

## 2010-06-02 NOTE — Letter (Signed)
Summary: Generic Letter  [facility]  [address]   [place], [place] [address]   Phone: [phone]  Fax: [phone]    01/01/2010 MRN: [id number]  [address] [place], [place]  [address]  Dear Ms. MCCOWN,  Your thyroid test came back.  This is the value below, it is high which means you will need to be on thyroid medication.  Tests: (1) TSH (232[address])   TSH                  [H]  32.380 uIU/mL               0.350-4.500   Please come back and see me in my office as soon as possible to start your medications and to discuss your thyroid condition.   Sincerely,   Edd Arbour M.D. [facility]  Appended Document: Generic Letter mailed

## 2010-06-02 NOTE — Assessment & Plan Note (Signed)
Summary: FU/PER DR NEAL/RH   Vital Signs:  Patient profile:   60 year old female Weight:      251 pounds Temp:     98.7 degrees F oral Pulse rate:   69 / minute Pulse rhythm:   regular BP sitting:   139 / 89  (left arm) Cuff size:   large  Vitals Entered By: Loralee Pacas CMA (April 09, 2010 8:41 AM) CC: follow-up visit   Primary Care Provider:  Edd Arbour  CC:  follow-up visit.  History of Present Illness: f/u pelvic pressure Pap and PUS were normal and she is notified of that today. Continues to have urinary frequency, sense of pelvic pressure does admit to some constipation  Current Medications (verified): 1)  Atenolol 50 Mg Tabs (Atenolol) .... Take One Table Two Times A Day 2)  Paroxetine Hcl 40 Mg Tabs (Paroxetine Hcl) .... Take One Pill Twice A Day 3)  Synthroid 200 Mcg Tabs (Levothyroxine Sodium) .... Take One Tablet Once A Day 4)  Hydroxyzine Hcl 25 Mg/ml Soln (Hydroxyzine Hcl) .... Use Q6 Hours As Needed 5)  Lunesta 1 Mg Tabs (Eszopiclone) .... Take One Tab At Bedtime 6)  Xenical 120 Mg Caps (Orlistat) .... Take 1 Pill At Every Fatty Meal. 7)  Ibuprofen 800 Mg Tabs (Ibuprofen) .... Take 1 Tab Every 8 Hours As Needed For Pain. Eat Something With It Every Time.  Allergies: No Known Drug Allergies  Review of Systems  The patient denies fever, weight loss, weight gain, and severe indigestion/heartburn.         Please see HPI for additional ROS.   Physical Exam  General:  alert, well-developed, well-nourished, well-hydrated, and overweight-appearing.   Neck:  supple, no masses, and no thyromegaly.   Abdomen:  soft, non-tender, normal bowel sounds, and no distention.     Impression & Recommendations:  Problem # 1:  PELVIC PAIN, CHRONIC (ICD-789.09)  possibly related to her constipation--we discussed adding fiber, increasing water.   Orders: [facility]- Est  Level 4 ([address])  Problem # 2:  DYSURIA (ICD-788.1)  Urine cx negative so I am left with a  differential of 1) constipation which coule account for pelvic pressure. 2) interstitial cystitis  3) chronic bladder irritation from LARGE amount of soda she takes in. we discussed at length 4) bladder spasm will decrease soda to <12 oz /day, check tsh, glucose and cr as she is due thryoid check anyway f/u pcp--has appt tomorrow. If change in dietary habits etc does not improve, would consider med for bladder spasm  Orders: [facility]- Est  Level 4 ([address])  Complete Medication List: 1)  Atenolol 50 Mg Tabs (Atenolol) .... Take one table two times a day 2)  Paroxetine Hcl 40 Mg Tabs (Paroxetine hcl) .... Take one pill twice a day 3)  Synthroid 200 Mcg Tabs (Levothyroxine sodium) .... Take one tablet once a day 4)  Hydroxyzine Hcl 25 Mg/ml Soln (Hydroxyzine hcl) .... Use q6 hours as needed 5)  Lunesta 1 Mg Tabs (Eszopiclone) .... Take one tab at bedtime 6)  Xenical 120 Mg Caps (Orlistat) .... Take 1 pill at every fatty meal. 7)  Ibuprofen 800 Mg Tabs (Ibuprofen) .... Take 1 tab every 8 hours as needed for pain. eat something with it every time.  Other Orders: Basic [facility] [phone]) TSH-[facility] [phone])  Patient Instructions: 1)  Start OTC fiber replacement at 1/2 dose once a day. Do this every day for a week and increase to half dose. Continue for a  week and each week add 1/4 dose until you are at typical stating dose. stay with that dose, taking once daily.    Orders Added: 1)  Basic [facility] [[id number]] 2)  TSH-[facility] [[id number]] 3)  [facility]- Est  Level 4 [[address]]

## 2010-06-02 NOTE — Letter (Signed)
Summary: Generic Letter  [facility]  [address]   [place], [place] [address]   Phone: [phone]  Fax: [phone]    03/23/2010  DHALIA ZINGARO [address] [place], [place]  [address]  Dear Ms. OZIMEK,      We have been unable to contact you by phone. Your pharmacy requested a refill on your thyroid medication. Dr. Rivka Safer does need to see you to discuss your appropriate dose of thyroid medication  He went ahead and refilled your previous dosage of the synthroid medication  but your dose does need to be adjusted and he must see you in order to do this. Please call our office at your earliest convenience.       Sincerely,   Theresia Lo RN  Appended Document: Generic Letter letter mailed.

## 2010-06-02 NOTE — Assessment & Plan Note (Signed)
Summary: NP,tcb   Vital Signs:  Patient profile:   60 year old female Height:      64.5 inches Weight:      245.8 pounds BMI:     41.69 Temp:     98 degrees F oral Pulse rate:   67 / minute Pulse rhythm:   regular BP sitting:   147 / 91  (right arm) Cuff size:   large  Vitals Entered By: Loralee Pacas CMA (June 24, 2009 10:29 AM)  Primary Care Provider:  Magnus Ivan MD  CC:  NP/cyanotic fingers/stiffness/med refills/flesh pain/thyroid disease.  History of Present Illness: NP:  patient is meeting me for the first time  stiffness: Character: takes a while to get up, hard to loosen up Location: everywhere Onset:  chronic problem but in last 2 months symptoms have been getting worse Symptoms better:  moving around, but not too much which causes pain, tylenol arthritis helps some Symptoms worse:  moving around too much ROS: increased fatigue, no swelling, redness.  no weakness yet pt off balance, not particularly weak Locking: none Red Flags Fever: none  Multiple joints:  yes  Rash: one on back (hydroxizine)  STD exposure: no  cyanotic fingers does report fingertips and toes get blue, not noticed symmetrical movement but index and middle finger afffected and it spares the thumb.    thyroid problems: patient claims that she had iodine treatment for hyperthyroidism then developed hypothyroidism.  refill meds:  paroxetine, syntroid, and atenolol  flesh pain Character: hot needles and putting in flesh inside the flesh, not in bones, muscles? flesh? feels like insect bite Location:   mostly in L upper arm (stomach, under breast), 4 or 5 places Onset:  chronic, worse before and better now, 10-11 years Symptoms better: Lyrica some but makes makes pt sleepy, hydroxizine helps but makes drowsy Symptoms worse: after rubbing painful no radiation   Extra medication:  caltrate 600 D, D-3 1000 International Units 1 tablet, Aciatrim Brailian Diet Aid, 2 caps/day, Fish Oil  1 tab/day   Current Problems (verified): 1)  Essential Hypertension, Benign  (ICD-401.1) 2)  Unspecified Disorder of Thyroid  (ICD-246.9) 3)  Cyanosis  (ICD-782.5) 4)  Unspecified Disorder of Skin&subcutaneous Tissue  (ICD-709.9) 5)  Stiffness  (ICD-719.50)  Current Medications (verified): 1)  Atenolol 50 Mg Tabs (Atenolol) .... Take One Table Two Times A Day 2)  Paroxetine Hcl 40 Mg Tabs (Paroxetine Hcl) .... Take One Pill Once A Day 3)  Synthroid 200 Mcg Tabs (Levothyroxine Sodium) .... Take One Tablet Once A Day 4)  Hydroxyzine Hcl 25 Mg/ml Soln (Hydroxyzine Hcl) .... 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  no cyanosis. left back with erythematous maculopapular lesions palpation of left upper arm reveals palpable lumps which are painful  upon palpation Psych:  memory intact for recent and remote, normally interactive, good eye contact, not anxious appearing, and not depressed appearing.   23  Impression & Recommendations:  Problem # 1:  STIFFNESS (ICD-719.50) Assessment New patient has a history of arthritis and her aunt has rheumatoid arthitis and the fact that this is a chronic problem.  also should consider testing for rheumatoid arthritis  and thyroid after [facility] attained.  need to consider pain management based on test results.   Orders: [facility]- Est  Level 4 ([address])  Problem # 2:  CYANOSIS (ICD-782.5) Assessment: New  patient may have raynaud's based on history.  can be associated with hypothyroid disease and rheumatoid arthritis.  further blood work to be after [facility] card attained  Orders: [facility]- Est  Level 4 ([address])  Problem # 3:  UNSPECIFIED DISORDER OF THYROID (ICD-246.9) Assessment: New  will need to do thyroid testing blood work in the future.  Orders: [facility]- Est  Level 4 ([address])  Problem # 4:  UNSPECIFIED DISORDER OF SKIN&SUBCUTANEOUS TISSUE (ICD-709.9) not exactly sure of the nature of flesh disturbances and lumps under skin.  will investigate further at future visits.  wonder if could be associated with other diseases. [facility]- Est  Level 4 ([address])  Problem # 5:  ESSENTIAL HYPERTENSION, BENIGN (ICD-401.1)  Complete Medication List: 1)  Atenolol 50 Mg Tabs (Atenolol) .... Take one table two times a day 2)  Paroxetine Hcl 40 Mg Tabs (Paroxetine hcl) .... Take one pill once a day 3)  Synthroid 200 Mcg Tabs (Levothyroxine sodium) .... Take one tablet once a day 4)  Hydroxyzine Hcl 25 Mg/ml Soln (Hydroxyzine hcl) .... Use q6 hours as needed  Patient Instructions: 1)  Ms. Hawa, thank you for coming to see me today.  Because of the fact that you might have Raynaud's disease and hypothyroidism, we might want to run a few labs on you.  2)  Please get your [facility] information together so that this will not be an added expense to you. 3)  Please follow up with me once you get your [facility] card so that we can do further analysis on you and decide on any further changes in your medication regimen. 4)  Thank you and be blessed!  Appended Document: Orders Update    Clinical Lists Changes  Orders: Added new Test order of [facility]- New Level 4 ([address]) - Signed

## 2010-06-02 NOTE — Letter (Signed)
Summary: Generic Letter  [facility]  [address]   [place], [place] [address]   Phone: [phone]  Fax: [phone]    12/30/2009  Cheryl Castro [address] [place], [place]  [address]  To whom ever it may concern,  Cheryl Castro has severe back pain that at times can be debilitating. She has reported difficulty with long car rides because of this back pain. If she could limit her length of car ride this would relieve her suffering.   Sincerely,   Edd Arbour

## 2010-06-02 NOTE — Progress Notes (Signed)
Summary: Triage call  Phone Note Call from Patient   Caller: Patient Call For: [phone] Summary of Call: Patient c/o of pain to lower back and glandular swelling to face.  Problem seem to be worsening.  Need to see someone else if possible tomorrow or this week. Initial call taken by: Abundio Miu,  February 23, 2010 4:37 PM  Follow-up for Phone Call        Returned call.  No answer.  [facility] to call us tomorrow if she still feel that she needs to be seen. Follow-up by: Dennison Nancy RN,  February 23, 2010 5:05 PM     Appended Document: Triage call tried to call above number and now phone is out of service. will await call back from patient.

## 2010-06-04 NOTE — Miscellaneous (Signed)
Summary: call from pharmacy  Clinical Lists Changes received fax from pharmacy regarding meloxicam. MD had ordered meloxicam 7.5 mg every 6 hours and the pharmacy notified  MD that max dose is two times a day ( 15 mg daily total  ). paged Dr. Rivka Safer and he advises to change RX to 7.5 mg two times a day.. Medications: Added new medication of MELOXICAM 7.5 MG TABS (MELOXICAM) take one tablet twice daily as needed for pain - Signed Removed medication of MELOXICAM 7.5 MG TABS (MELOXICAM) take one tab every 6 hours as needed for pain Rx of MELOXICAM 7.5 MG TABS (MELOXICAM) take one tablet twice daily as needed for pain;  #30 x 1;  Signed;  Entered by: Theresia Lo RN;  Authorized by: Edd Arbour;  Method used: Telephoned to [facility] [address], [place], [place]  [id number], Ph: [phone], Fax: [phone]    Prescriptions: MELOXICAM 7.5 MG TABS (MELOXICAM) take one tablet twice daily as needed for pain  #30 x 1   Entered by:   Theresia Lo RN   Authorized by:   Edd Arbour   Signed by:   Edd Arbour on 04/13/2010   Method used:   Telephoned to ...       [facility] [address] (retail)       [address]       [place], [place]  [id number]       Ph: [phone]       Fax: [phone]   RxID:   [id number]

## 2010-06-04 NOTE — Letter (Signed)
Summary: Generic Letter  [facility]  [address]   [place], [place] [address]   Phone: [phone]  Fax: [phone]    04/10/2010 MRN: [id number]  [address] [place], [place]  [address]  Dear Ms. Cheryl Castro,  Your Laboratory tests came back and it revealed a high Thyroid Stimulating Hormone level. This means that your thyroid is not functioning properly and you may need to take a new medication.  Please schedule an appointment to see me asap, if you already have, disregard this letter.   Sincerely,   Edd Arbour MD [facility]  Appended Document: Generic Letter mailed

## 2010-06-04 NOTE — Letter (Signed)
Summary: Generic Letter  [facility]  [address]   [place], [place] [address]   Phone: [phone]  Fax: [phone]    05/29/2010  KADYNCE BONDS [address] [place], [place]  [address]  Dear Ms. FELIZ,      I was unable to contact you by phone. Your doctor request you schedule an appointment in  within next two weeks for follow up with him . He also  needs to check your TSH ( thyroid test  ) Please call our office to schedule .       Sincerely,   Theresia Lo RN

## 2010-06-04 NOTE — Progress Notes (Signed)
Summary: refill  Phone Note Refill Request Call back at Home Phone [phone] Message from:  Patient  Refills Requested: Medication #1:  PAROXETINE HCL 40 MG TABS take one pill twice a day  Medication #2:  LUNESTA 1 MG TABS take one tab at bedtime   Notes: would like to change  to something cheaper Initial call taken by: De Nurse,  May 21, 2010 2:43 PM  Follow-up for Phone Call        will forward to MD. Follow-up by: Theresia Lo RN,  May 21, 2010 2:54 PM  Additional Follow-up for Phone Call Additional follow up Details #1::        Please have her follow up in the clinic in two weeks. she needs her TSH checked as well.  Thank you Additional Follow-up by: Edd Arbour,  May 28, 2010 8:38 PM    Prescriptions: PAROXETINE HCL 40 MG TABS (PAROXETINE HCL) take one pill twice a day  #30 x 0   Entered by:   Theresia Lo RN   Authorized by:   Edd Arbour   Signed by:   Theresia Lo RN on 05/21/2010   Method used:   Electronically to        [facility] (retail)       [address]       [place], [place]  [id number]       Ph: [phone]       Fax: [phone]   RxID:   [id number]  spoke with patient and she is out of paroxetine today. states she has continued taking regularly although we only gave her enough for 2 months August 2011. states she may have gone to Urgent Care at some point and they may have refilled. Consulted with Dr. Mauricio Po and he states  to give patient enough for 2 weeks and send message to Dr. Rivka Safer to address further refills . Does patient need appointment regarding this , etc ? Pharmacy is [facility]. also patient wants less expensive sleeping medication.  Theresia Lo RN  May 21, 2010 3:05 PM

## 2010-06-13 ENCOUNTER — Other Ambulatory Visit: Payer: Self-pay | Admitting: Family Medicine

## 2010-06-24 ENCOUNTER — Encounter: Payer: Self-pay | Admitting: Family Medicine

## 2010-06-24 ENCOUNTER — Ambulatory Visit (INDEPENDENT_AMBULATORY_CARE_PROVIDER_SITE_OTHER): Payer: Self-pay | Admitting: Family Medicine

## 2010-06-24 DIAGNOSIS — K219 Gastro-esophageal reflux disease without esophagitis: Secondary | ICD-10-CM

## 2010-06-24 DIAGNOSIS — M797 Fibromyalgia: Secondary | ICD-10-CM

## 2010-06-24 DIAGNOSIS — I10 Essential (primary) hypertension: Secondary | ICD-10-CM

## 2010-06-24 DIAGNOSIS — E039 Hypothyroidism, unspecified: Secondary | ICD-10-CM

## 2010-06-24 DIAGNOSIS — IMO0001 Reserved for inherently not codable concepts without codable children: Secondary | ICD-10-CM

## 2010-06-24 LAB — CONVERTED CEMR LAB: TSH: 10.145 microintl units/mL — ABNORMAL HIGH (ref 0.350–4.500)

## 2010-06-24 MED ORDER — LEVOTHYROXINE SODIUM 200 MCG PO TABS: 200.0000 ug | ORAL_TABLET | Freq: Every day | ORAL | Status: DC

## 2010-06-24 MED ORDER — ATENOLOL 50 MG PO TABS: 50.0000 mg | ORAL_TABLET | Freq: Two times a day (BID) | ORAL | Status: DC

## 2010-06-24 MED ORDER — PANTOPRAZOLE SODIUM 40 MG PO TBEC: 40.0000 mg | DELAYED_RELEASE_TABLET | Freq: Every day | ORAL | Status: DC

## 2010-06-24 MED ORDER — PREGABALIN 100 MG PO CAPS: 100.0000 mg | ORAL_CAPSULE | Freq: Three times a day (TID) | ORAL | Status: DC

## 2010-06-24 MED ORDER — PAROXETINE HCL 40 MG PO TABS: 40.0000 mg | ORAL_TABLET | Freq: Two times a day (BID) | ORAL | Status: DC

## 2010-06-24 NOTE — Progress Notes (Signed)
  Subjective:    Patient ID: Cheryl Castro, female    DOB: Sep 24, 1950, 60 y.o.   MRN: [id number]  Thyroid Problem Presents for follow-up visit. Symptoms include dry skin, fatigue, hoarse voice and weight gain. The symptoms have been stable. (Fibromyalgia)  Muscle Pain This is a chronic problem. The current episode started more than 1 year ago. The problem occurs daily. The problem has been waxing and waning since onset. The context of the pain is unknown. The pain is present in the left shoulder, right shoulder and neck. The pain is mild. The symptoms are aggravated by nothing. Associated symptoms include chest pain and fatigue. Treatments tried: lyrica. The treatment provided moderate relief. (Fibromyalgia)  Gastrophageal Reflux She complains of chest pain and a hoarse voice. This is a recurrent problem. The current episode started more than 1 year ago. The problem occurs occasionally. The problem has been gradually worsening. The heartburn duration is several minutes. The heartburn is located in the substernum. The heartburn is of moderate intensity. The heartburn wakes her from sleep. The heartburn limits her activity. The heartburn changes with position. The symptoms are aggravated by certain foods and lying down. Associated symptoms include fatigue. Risk factors include obesity, smoking/tobacco exposure and lack of exercise. She has tried nothing for the symptoms. The treatment provided no relief.      Review of Systems  Constitutional: Positive for weight gain and fatigue.  HENT: Positive for hoarse voice.   Cardiovascular: Positive for chest pain.  All other systems reviewed and are negative.       Objective:   Physical Exam  Constitutional: She appears well-developed and well-nourished.  Cardiovascular: Normal rate, regular rhythm and normal heart sounds.   Pulmonary/Chest: Effort normal and breath sounds normal.          Assessment & Plan:  Pt. Is a 60 y/o aaf with  hypothyroidism, here for a recheck of her TSH. Also complains of fibromyalgia type pain.  1. TSH, c/w current dose 2. Fibromyalgia: Restarted her Lyrica at low dose 3. Refilled paxil 4. GERD: started daily protonix, advised about risk reduction 5. HTN -refilled meds.

## 2010-06-25 ENCOUNTER — Other Ambulatory Visit: Payer: Self-pay | Admitting: Family Medicine

## 2010-06-26 ENCOUNTER — Telehealth: Payer: Self-pay | Admitting: Family Medicine

## 2010-06-26 DIAGNOSIS — M797 Fibromyalgia: Secondary | ICD-10-CM

## 2010-06-26 NOTE — Telephone Encounter (Signed)
Patient calling asking about Lyrica script.  The pharmacy did not receive it when prescribed on the 21st so it was called in.

## 2010-06-26 NOTE — Telephone Encounter (Signed)
Checking status of rx for lyrica, suppose to be called in 2 days ago at ofc visit, pt [facility] to [facility]

## 2010-06-26 NOTE — Telephone Encounter (Signed)
Please review and refill Not on med list in [facility]

## 2010-07-22 LAB — POCT I-STAT, CHEM 8
BUN: 8 mg/dL (ref 6–23)
Calcium, Ion: 1.1 mmol/L — ABNORMAL LOW (ref 1.12–1.32)
Creatinine, Ser: 1 mg/dL (ref 0.4–1.2)
Glucose, Bld: 100 mg/dL — ABNORMAL HIGH (ref 70–99)
HCT: 44 % (ref 36.0–46.0)
TCO2: 23 mmol/L (ref 0–100)

## 2010-07-22 LAB — POCT CARDIAC MARKERS
CKMB, poc: 2.3 ng/mL (ref 1.0–8.0)
Myoglobin, poc: 67.5 ng/mL (ref 12–200)

## 2010-07-22 LAB — CBC
HCT: 40.8 % (ref 36.0–46.0)
RDW: 14.4 % (ref 11.5–15.5)

## 2010-09-06 ENCOUNTER — Emergency Department (HOSPITAL_COMMUNITY)
Admission: EM | Admit: 2010-09-06 | Discharge: 2010-09-06 | Disposition: A | Discharge status: Home / Self Care | Payer: Self-pay | Attending: Emergency Medicine | Admitting: Emergency Medicine

## 2010-09-06 DIAGNOSIS — I10 Essential (primary) hypertension: Secondary | ICD-10-CM | POA: Insufficient documentation

## 2010-09-06 DIAGNOSIS — I76 Septic arterial embolism: Secondary | ICD-10-CM | POA: Insufficient documentation

## 2010-09-06 DIAGNOSIS — M546 Pain in thoracic spine: Secondary | ICD-10-CM | POA: Insufficient documentation

## 2010-09-15 NOTE — Op Note (Signed)
[name], [name]               ACCOUNT NO.:  [id number]   MEDICAL RECORD NO.:  [id number]          PATIENT TYPE:  AMB   LOCATION:  ENDO                         FACILITY:  [facility]   PHYSICIAN:  James L. Malon Kindle., M.D.DATE OF BIRTH:  1951/04/12   DATE OF PROCEDURE:  02/09/2007  DATE OF DISCHARGE:  02/09/2007                               OPERATIVE REPORT   PROCEDURE:  Bravo ambulatory pH testing.   ENDOSCOPIST:  Llana Aliment. Randa Evens, M.D.   INDICATIONS:  Persistent upper GI pain and upper abdominal pain with  extensive tests to this time negative.   DESCRIPTION OF PROCEDURE:  The Bravo capsule was placed at an endoscopy  on October 9.  It was placed at 33 cm.  The patient had a reddened  esophagus and a hiatal hernia at the time of the endoscopy.  There were  any issues with her returning the equipment; she kept it for several  weeks before returning it.  The results are as follows:  There were 2  days of analysis.  (1) On day #1, the patient 24 reflux episodes with a  total of 5 episodes longer than 5 minutes with the longest episode being  13 minutes.  The total percent of time, the pH was 69 minutes with a  percentage of 7.7.  She had heartburn 47% of the time.  The pH was less  than 4.  She had no chest pain when the pH was +4.  The DeMeester score  for day 1 was 28 with normal being less than 14.  (2) Summary of day #2  analysis was similar with the pH less than 4, 153 minutes or 17.8% of  the time with a DeMeester score of 77.8, normal being less than 14%.  The patient has significant heartburn and again, no chest pain during  this.   ASSESSMENT:  Bravo pH monitoring reveals significant esophageal reflux.   PLAN:  We will have the patient return to the office and we will discuss  further with her the treatment of her reflux.  If she is not obtaining  good results with medical therapy, a surgical option can be presented to  her.           ______________________________  Llana Aliment. Malon Kindle., M.D.     Waldron Session  D:  03/03/2007  T:  03/05/2007  Job:  [id number]   cc:   Fleet Contras, M.D.  Fax: [phone]

## 2010-09-15 NOTE — Op Note (Signed)
Cheryl Castro, Cheryl Castro               ACCOUNT NO.:  [id number]   MEDICAL RECORD NO.:  [id number]          PATIENT TYPE:  AMB   LOCATION:  ENDO                         FACILITY:  [facility]   PHYSICIAN:  James L. Malon Kindle., M.D.DATE OF BIRTH:  08-24-1950   DATE OF PROCEDURE:  02/09/2007  DATE OF DISCHARGE:                               OPERATIVE REPORT   PROCEDURE:  Esophagogastroduodenoscopy with placement of Bravo capsule.   INDICATION:  Patient with persistent chest pain, reflux symptoms whose  symptoms have been somewhat refractory to standard reflux therapy.  This  is part of a workup in an attempt to determine cause of her continuing  chest pain.   DESCRIPTION OF PROCEDURE:  Procedure had been explained to the patient  and consent was obtained.  __________ position the endoscope was  inserted and advanced easily into the esophagus.  The stomach was  entered, pylorus identified __________ duodenum __________ second  portion were seen and what was seen was unremarkable.  The pyloric  channel and antrum and body were normal, as was the fundus and cardia.  The scope was withdrawn.  There was a hiatal hernia and the C-line was  located roughly at 39 cm.  We came 6 cm above that and this area  appeared endoscopically normal.  This was noted at 33 cm.  The scope was  withdrawn.  Thirty-three centimeters was marked on the Bravo capsule  insertion tube __________ into the esophagus to the 33 cm mark and went  ahead and placed the scope in the proximal esophagus so we could see the  insertion tube going into the esophagus.  Suction was then applied for  45 seconds and the capsule was fired and the insertion tube removed.  We  then reinserted the scope and saw the Bravo capsule attached to the  esophagealvault.  The scope was withdrawn.  Patient tolerated the  procedure well.   ASSESSMENT:  1. Gastroesophageal reflux disease with symptoms refractory to      standard therapy with continuing  epigastric and chest pain.  2. Placement of Bravo capsule. __________ will continue with reflux      information and obtain __________ information from the Bravo      capsule and see back in the office in 6 weeks.           ______________________________  [name], M.D.     Waldron Session  D:  02/09/2007  T:  02/09/2007  Job:  [id number]   cc:   Fleet Contras, M.D.  Fax: [phone]

## 2010-09-18 NOTE — Procedures (Signed)
Cheryl Castro, Cheryl Castro               ACCOUNT NO.:  [id number]   MEDICAL RECORD NO.:  [id number]          PATIENT TYPE:  OUT   LOCATION:  SLEEP CENTER                 FACILITY:  [facility]   PHYSICIAN:  Clinton D. Maple Hudson, MD, FCCP, FACPDATE OF BIRTH:  Jul 13, 1950   DATE OF STUDY:  05/31/2006                            NOCTURNAL POLYSOMNOGRAM   INDICATION FOR STUDY:  Insomnia with sleep apnea.   EPWORTH SLEEPINESS SCORE:  2/24, BMI 40.5, weight 238 pounds.   MEDICATIONS:  Home medications are listed and reviewed.   A diagnostic NPSG on April 22, 2004, recorded an AHI of 68 per hour.  CPAP titration is requested.   SLEEP ARCHITECTURE:  Total sleep time 259 minutes with sleep efficiency  70%.  Stage I was 13%, stage II 64%, stages III and IV 15%, REM 8% of  total sleep time.  Sleep latency 27 minutes, REM latency 184 minutes,  awake after sleep onset 85 minutes, arousal index 25.7.  No bedtime  medication was taken.   RESPIRATORY DATA:  CPAP titration protocol.  CPAP was titrated to 18 CWP  (AHI 3.1 per hour).  The technician then switched to BiPAP and titrated  to inspiratory 12/expiratory 19 CWP, AHI 0 per hour after patient  complained of difficulty exhaling at higher CPAP pressures.  CPAP  settings of 14 and 15 CWP __________ an AHI of 0 per hour.  A small  ResMed Ultra Mirage mask was used with heated humidifier.   OXYGEN DATA:  Moderate to loud snoring with oxygen desaturation to a  nadir of 84%.  Mean oxygen saturation after CPAP control was 97% on room  air.   CARDIAC DATA:  Sinus rhythm with occasional PAC and PVC.   MOVEMENT-PARASOMNIA:  Significant bruxism.  Frequent limb jerks with a  total of 515 recorded, of which 40 were associated with arousal or  awakening, for a periodic limb movement with arousal index of 9.3 per  hour, which is increased.   IMPRESSIONS-RECOMMENDATIONS:  1. CPAP titration to a recommended initial trial pressure of 15 CWP,      AHI 0 per hour.   Higher pressures were associated with complaint of      difficulty exhaling and had led the technician to try BiPAP.  It is      likely that a CPAP of 15 will provide adequate control and be      better tolerated.  A small ResMed Ultra Mirage full face mask was      used with a heated humidifier.  2. A diagnostic nocturnal polysomnography on April 22, 2004, had      recorded an apnea-hypopnea index of 68 per hour.  3. Bruxism, significant.  4. 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  If      this persists after adjustment to home CPAP, consider specific      therapy such as Requip or Mirapex.      Clinton D. Maple Hudson, MD, [facility], FACP  Diplomate, Biomedical engineer of Sleep Medicine  Electronically Signed     CDY/MEDQ  D:  06/05/2006 10:25:54  T:  06/05/2006 18:54:32  Job:  [id number]

## 2010-09-18 NOTE — Procedures (Signed)
NAME:  Cheryl Castro, Cheryl Castro               ACCOUNT NO.:  [id number]   MEDICAL RECORD NO.:  [id number]          PATIENT TYPE:  OUT   LOCATION:  SLEEP CENTER                 FACILITY:  [facility]   PHYSICIAN:  Clinton D. Maple Hudson, M.D. DATE OF BIRTH:  Jul 07, 1950   DATE OF STUDY:  04/22/2004                              NOCTURNAL POLYSOMNOGRAM   STUDY DATE:  April 22, 2004   REFERRING PHYSICIAN:  Fleet Contras, M.D.   INDICATION FOR STUDY:  Hypersomnia with sleep apnea.   EPWORTH SLEEPINESS SCORE:  9/24   NECK SIZE:  14-1/2 inches   BODY MASS INDEX:  41   WEIGHT:  242 pounds   SLEEP ARCHITECTURE:  Short total sleep time, inadequate for evaluation, 93  minutes.  Sleep efficiency was 23%.  Stage I was 23%, stage II 77%, stages  III and IV were absent, REM was absent, sleep latency was 259 minutes with  sleep onset at 2:55 a.m.  No sleep medications were taken.  The technician  described the patient as extremely restless throughout the study.  The  patient described latency to sleep onset and length of sleep as being same  as at home and said in general quality of sleep was same as at home.  Arousal index was 61.   RESPIRATORY DATA:  RDI 68 obstructive events per hour indicating very severe  obstructive sleep apnea/hypopnea syndrome.  This included 34 obstructive  apneas and 72 hypopneas within the recorded sleep interval.  Events were not  positional.   OXYGEN DATA:  Moderate to loud snoring with oxygen desaturation to 83%  during respiratory events.  Mean oxygen saturation through the study was  93%.   CARDIAC DATA:  Normal sinus rhythm.   MOVEMENT/PARASOMNIA:  Occasional leg jerks with insignificant contribution  to sleep disturbance.  One bathroom trip.   IMPRESSION/RECOMMENDATION:  Two hours of recorded sleep is usually  considered minimum for reliable interpretation and characterization of  sleep.  This patient only slept 93 minutes.  Note the patient indicates this  is typical  sleep experience.  During the available sleep time there was  evidence of severe obstructive sleep apnea/hypopnea syndrome, RDI 68/hr with  desaturation to 83%.  This is unlikely to be full explanation for the short  total sleep time because sleep onset was late.  Consider evaluating the  patient for abnormal sleep hygiene and factors contributing to insomnia.  Consider returning with sleep medication for a split-study protocol or  continuous positive airway pressure titration protocol.  The choice may be  affected by the patient's insurance company requirements for a diagnostic  study acceptable to them as far as duration.  This can be discussed with the  ordering physician if necessary.                                                           Clinton D. Maple Hudson, M.D.  Diplomate, [employer] Board   CDY/MEDQ  D:  04/27/2004 10:08:53  T:  04/27/2004 19:12:22  Job:  [id number]

## 2010-09-18 NOTE — Procedures (Signed)
[facility]  Patient:    Cheryl Castro, Cheryl Castro                MRN: [id number] Proc. Date: 06/09/00 Adm. Date:  [id number] Attending:  Orland Mustard CC:         Valaria Good, M.D.   Procedure Report  PROCEDURE:  Esophagogastroduodenoscopy.  MEDICATIONS:  Cetacaine spray, fentanyl 75 mcg, Versed 8 mg IV.  INDICATIONS FOR PROCEDURE:  Upper abdominal pain, extensive workup negative.  DESCRIPTION OF PROCEDURE:  The procedure had been explained to the patient and consent obtained. With the patient in the left lateral decubitus position, the Olympus video endoscope was inserted blindly in the esophagus and advanced under direct vision. The stomach was entered, pylorus identified and passed. The duodenum including the bulb and second portion were seen well and were unremarkable. The scope withdrawn back into the stomach. The antrum and pyloric channel were normal. The body was free of ulceration of masses. The fundus and cardia were seen in retroflexed view and were normal. There was a 3-4 cm hiatal hernia with a widely patent GE junction and free reflux during the procedure. The distal esophagus did not reveal any evidence of Barretts but was reddened diffusely with no ulcerations. This had the appearance of mild reflux esophagitis. The scope was withdrawn. The patient tolerated the procedure well and was maintained on low flow oxygen and pulse oximeter throughout the procedure.  ASSESSMENT:  Hiatal hernia with gastroesophageal reflux disease and mild reflux esophagitis.  PLAN:  Will give the patient a trial of Nexium rather than Prevacid, antireflux instructions, and see back in the office in six weeks. DD:  06/09/00 TD:  06/10/00 Job: [id number]

## 2010-10-26 ENCOUNTER — Other Ambulatory Visit: Payer: Self-pay | Admitting: Family Medicine

## 2010-10-26 DIAGNOSIS — M797 Fibromyalgia: Secondary | ICD-10-CM

## 2010-10-26 DIAGNOSIS — I10 Essential (primary) hypertension: Secondary | ICD-10-CM

## 2010-10-26 DIAGNOSIS — K219 Gastro-esophageal reflux disease without esophagitis: Secondary | ICD-10-CM

## 2010-10-26 DIAGNOSIS — E039 Hypothyroidism, unspecified: Secondary | ICD-10-CM

## 2010-10-26 MED ORDER — ATENOLOL 50 MG PO TABS: 50.0000 mg | ORAL_TABLET | Freq: Two times a day (BID) | ORAL | Status: DC

## 2010-10-26 MED ORDER — ORLISTAT 120 MG PO CAPS: 120.0000 mg | ORAL_CAPSULE | Freq: Three times a day (TID) | ORAL | Status: DC

## 2010-10-26 MED ORDER — MELOXICAM 7.5 MG PO TABS: 7.5000 mg | ORAL_TABLET | Freq: Two times a day (BID) | ORAL | Status: DC | PRN

## 2010-10-26 MED ORDER — PAROXETINE HCL 40 MG PO TABS: 40.0000 mg | ORAL_TABLET | Freq: Two times a day (BID) | ORAL | Status: DC

## 2010-10-26 MED ORDER — PREGABALIN 100 MG PO CAPS: 100.0000 mg | ORAL_CAPSULE | Freq: Three times a day (TID) | ORAL | Status: DC

## 2010-10-26 MED ORDER — PANTOPRAZOLE SODIUM 40 MG PO TBEC: 40.0000 mg | DELAYED_RELEASE_TABLET | Freq: Every day | ORAL | Status: DC

## 2010-10-26 MED ORDER — LEVOTHYROXINE SODIUM 200 MCG PO TABS: 200.0000 ug | ORAL_TABLET | Freq: Every day | ORAL | Status: DC

## 2011-01-14 ENCOUNTER — Ambulatory Visit: Payer: Self-pay | Admitting: Family Medicine

## 2011-01-18 ENCOUNTER — Ambulatory Visit (INDEPENDENT_AMBULATORY_CARE_PROVIDER_SITE_OTHER): Payer: Self-pay | Admitting: Family Medicine

## 2011-01-18 ENCOUNTER — Encounter: Payer: Self-pay | Admitting: Family Medicine

## 2011-01-18 DIAGNOSIS — I10 Essential (primary) hypertension: Secondary | ICD-10-CM

## 2011-01-18 DIAGNOSIS — E039 Hypothyroidism, unspecified: Secondary | ICD-10-CM

## 2011-01-18 DIAGNOSIS — F329 Major depressive disorder, single episode, unspecified: Secondary | ICD-10-CM

## 2011-01-18 DIAGNOSIS — K219 Gastro-esophageal reflux disease without esophagitis: Secondary | ICD-10-CM

## 2011-01-18 MED ORDER — GABAPENTIN 300 MG PO CAPS: 300.0000 mg | ORAL_CAPSULE | Freq: Three times a day (TID) | ORAL | Status: DC

## 2011-01-18 MED ORDER — PAROXETINE HCL 40 MG PO TABS: 40.0000 mg | ORAL_TABLET | Freq: Two times a day (BID) | ORAL | Status: DC

## 2011-01-18 MED ORDER — ORLISTAT 120 MG PO CAPS: 120.0000 mg | ORAL_CAPSULE | Freq: Three times a day (TID) | ORAL | Status: DC

## 2011-01-18 MED ORDER — PANTOPRAZOLE SODIUM 40 MG PO TBEC: 40.0000 mg | DELAYED_RELEASE_TABLET | Freq: Every day | ORAL | Status: DC

## 2011-01-18 MED ORDER — PHENTERMINE HCL 37.5 MG PO CAPS: 37.5000 mg | ORAL_CAPSULE | ORAL | Status: DC

## 2011-01-18 MED ORDER — LEVOTHYROXINE SODIUM 200 MCG PO TABS: 200.0000 ug | ORAL_TABLET | Freq: Every day | ORAL | Status: DC

## 2011-01-18 MED ORDER — HYDROCHLOROTHIAZIDE 12.5 MG PO CAPS: 25.0000 mg | ORAL_CAPSULE | Freq: Every day | ORAL | Status: DC

## 2011-01-18 MED ORDER — ATENOLOL 50 MG PO TABS: 50.0000 mg | ORAL_TABLET | Freq: Two times a day (BID) | ORAL | Status: DC

## 2011-01-18 NOTE — Progress Notes (Signed)
Addended by: Edd Arbour on: 01/18/2011 05:24 PM   Modules accepted: Orders

## 2011-01-18 NOTE — Progress Notes (Signed)
  Subjective:    Patient ID: Cheryl Castro, female    DOB: January 28, 1951, 60 y.o.   MRN: [id number]  HPI 1. Obesity BMI 48. She is planning on dieting. She has hypothyroidism, but is taking supplements. She has successfully used Adipex in the past per patient. No hx of heart or valve disease. No stroke.   2. HTN Patient has flucuated in her BP in the past 2 years. She is HTN today at 168/95. She is only taking atenolol. She is also morbidly obese with a BMI of 48.  No stroke history, no CAD, no family hx of CAD except her father who died in his 49's.   Review of Systems  Constitutional: Positive for fatigue. Negative for fever, activity change, appetite change and unexpected weight change.  HENT: Negative for sore throat and neck pain.   Respiratory: Negative for apnea, cough and shortness of breath.   Cardiovascular: Negative for chest pain, palpitations and leg swelling.  Gastrointestinal: Negative for nausea, vomiting, abdominal pain, diarrhea and constipation.  Genitourinary: Negative for dysuria and vaginal discharge.  Musculoskeletal: Positive for back pain. Negative for myalgias and arthralgias.  Skin: Negative for rash.  Neurological: Negative for dizziness and headaches.  Psychiatric/Behavioral: Negative for sleep disturbance.       Objective:   Physical Exam  Nursing note and vitals reviewed. Constitutional: She appears well-developed and well-nourished. No distress.       Morbidly obese  HENT:  Head: Normocephalic and atraumatic.  Neck: No JVD present.  Cardiovascular: Normal rate, regular rhythm and normal heart sounds.   No murmur heard. Pulmonary/Chest: Effort normal and breath sounds normal. No respiratory distress. She has no wheezes.  Skin: She is not diaphoretic.      Assessment & Plan:  1. Obesity Counseled for 20 minutes on diet and exercise. She will start taking Adipex. I advised her about the risks or stroke/valve disease of the heart/htn risk/MI  risk. I explained this in laymens terms and she acknowledged understanding. She said despite the risk of heart attack she wanted to try the medication anyway's.   2. HTN Continued HTN Starting HCTZ 25 mg QD today. Follow up in 2 weeks for BP check.

## 2011-02-08 ENCOUNTER — Telehealth: Payer: Self-pay | Admitting: Family Medicine

## 2011-02-08 NOTE — Telephone Encounter (Signed)
Talked to patient about her blood pressure. She is taking adipex. She has not experienced side effects. She is scheduled to see me tomorrow.

## 2011-02-09 ENCOUNTER — Ambulatory Visit: Payer: Self-pay | Admitting: Family Medicine

## 2011-02-15 LAB — I-STAT 8, (EC8 V) (CONVERTED LAB)
Acid-Base Excess: 3 — ABNORMAL HIGH
HCT: 43
Hemoglobin: 14.6
Operator id: 235561
Potassium: 4.2
Sodium: 137
TCO2: 29

## 2011-02-15 LAB — POCT I-STAT CREATININE
Creatinine, Ser: 0.8
Operator id: 235561

## 2011-02-17 ENCOUNTER — Encounter: Payer: Self-pay | Admitting: Family Medicine

## 2011-02-17 ENCOUNTER — Ambulatory Visit (INDEPENDENT_AMBULATORY_CARE_PROVIDER_SITE_OTHER): Payer: Self-pay | Admitting: Family Medicine

## 2011-02-17 VITALS — BP 123/88 | HR 77 | Temp 98.1°F | Ht 65.5 in | Wt 257.4 lb

## 2011-02-17 DIAGNOSIS — Z23 Encounter for immunization: Secondary | ICD-10-CM

## 2011-02-17 DIAGNOSIS — Z1239 Encounter for other screening for malignant neoplasm of breast: Secondary | ICD-10-CM

## 2011-02-17 DIAGNOSIS — I10 Essential (primary) hypertension: Secondary | ICD-10-CM

## 2011-02-17 DIAGNOSIS — Z1211 Encounter for screening for malignant neoplasm of colon: Secondary | ICD-10-CM

## 2011-02-17 MED ORDER — GABAPENTIN 300 MG PO CAPS: 300.0000 mg | ORAL_CAPSULE | Freq: Three times a day (TID) | ORAL | Status: DC

## 2011-02-17 NOTE — Patient Instructions (Signed)

## 2011-02-17 NOTE — Progress Notes (Signed)
  Subjective:    Patient here for follow-up of elevated blood pressure.  She is not exercising and is adherent to a low-salt diet.  Blood pressure is well controlled at home. Cardiac symptoms: none. Patient denies: chest pain, chest pressure/discomfort, claudication, dyspnea, exertional chest pressure/discomfort, irregular heart beat, near-syncope, palpitations, syncope and tachypnea. Cardiovascular risk factors: hypertension, obesity (BMI >= 30 kg/m2) and sedentary lifestyle. Use of agents associated with hypertension: amphetamines. History of target organ damage: none.  The following portions of the patient's history were reviewed and updated as appropriate: allergies, current medications, past family history, past medical history, past social history, past surgical history and problem list.  Review of Systems Pertinent items are noted in HPI.     Objective:    BP 123/88  Pulse 77  Temp 98.1 F (36.7 C)  Ht 5' 5.5" (1.664 m)  Wt 257 lb 6.4 oz (116.756 kg)  BMI 42.18 kg/m2 General appearance: alert Lungs: clear to auscultation bilaterally Heart: regular rate and rhythm, S1, S2 normal, no murmur, click, rub or gallop    Assessment:    Hypertension, normal blood pressure 123/88. Evidence of target organ damage: none.    Plan:    Medication: no change. Dietary sodium restriction. Regular aerobic exercise. Follow up: 3 months and as needed.

## 2011-02-18 ENCOUNTER — Telehealth: Payer: Self-pay | Admitting: Family Medicine

## 2011-02-18 NOTE — Telephone Encounter (Signed)
The coding for the Mammogram orders is for Hypertension, but needs to be V76.12 Other Screening Mammogram.

## 2011-02-18 NOTE — Progress Notes (Signed)
Addended by: Edd Arbour on: 02/18/2011 02:25 PM   Modules accepted: Orders

## 2011-02-18 NOTE — Progress Notes (Signed)
Addended by: Edd Arbour on: 02/18/2011 02:23 PM   Modules accepted: Orders

## 2011-02-22 ENCOUNTER — Telehealth: Payer: Self-pay | Admitting: *Deleted

## 2011-02-22 ENCOUNTER — Encounter: Payer: Self-pay | Admitting: *Deleted

## 2011-02-22 NOTE — Telephone Encounter (Signed)
The number listed [phone] is a fax number. Looked back to locate another number [phone] and that number is disconnected.  Calling pt in regards to screening colonoscopy to be done at [facility] pt will need to contact their billing office at [phone] before they will schedule an appt for her.  I will forward a letter to the pt asking her to contact their office about this.Loralee Pacas [place]

## 2011-04-02 ENCOUNTER — Encounter: Payer: Self-pay | Admitting: Family Medicine

## 2011-04-02 ENCOUNTER — Ambulatory Visit (INDEPENDENT_AMBULATORY_CARE_PROVIDER_SITE_OTHER): Payer: Self-pay | Admitting: Family Medicine

## 2011-04-02 VITALS — BP 152/95 | HR 75 | Ht 65.5 in | Wt 261.4 lb

## 2011-04-02 DIAGNOSIS — Z111 Encounter for screening for respiratory tuberculosis: Secondary | ICD-10-CM

## 2011-04-02 NOTE — Progress Notes (Signed)
Addended by: Altamese Dilling A on: 04/02/2011 04:33 PM   Modules accepted: Orders

## 2011-04-02 NOTE — Patient Instructions (Signed)
You are getting a TB test today.  Please come back in 72 hours to have it read by our nursing team.

## 2011-04-02 NOTE — Progress Notes (Signed)
  Subjective:    Patient ID: Cheryl Castro, female    DOB: 03-03-1951, 60 y.o.   MRN: [id number]  HPI 1. Here for work forms to be filled out. Needs tb test for work.   Review of Systems No fevers, immunocompromise, no chills.    Objective:   Physical Exam Filed Vitals:   04/02/11 1553  BP: 152/95  Pulse: 75  Height: 5' 5.5" (1.664 m)  Weight: 261 lb 6.4 oz (118.57 kg)  constitution: well appearing, talking normally.    Assessment & Plan:

## 2011-04-05 ENCOUNTER — Ambulatory Visit (INDEPENDENT_AMBULATORY_CARE_PROVIDER_SITE_OTHER): Payer: Self-pay | Admitting: *Deleted

## 2011-04-05 DIAGNOSIS — Z111 Encounter for screening for respiratory tuberculosis: Secondary | ICD-10-CM

## 2011-04-05 DIAGNOSIS — IMO0001 Reserved for inherently not codable concepts without codable children: Secondary | ICD-10-CM

## 2011-04-05 LAB — TB SKIN TEST: TB Skin Test: NEGATIVE mm

## 2011-09-30 ENCOUNTER — Telehealth: Payer: Self-pay | Admitting: Family Medicine

## 2011-09-30 DIAGNOSIS — I10 Essential (primary) hypertension: Secondary | ICD-10-CM

## 2011-09-30 MED ORDER — ATENOLOL 50 MG PO TABS: 50.0000 mg | ORAL_TABLET | Freq: Two times a day (BID) | ORAL | Status: DC

## 2011-09-30 NOTE — Telephone Encounter (Signed)
Patient is hoping that Dr. Rivka Safer will be willing to prescribe a refill of her Atenolol to go to [facility] on [address].  She was scheduled to see Dr. Rivka Safer 04/02/11 and at that time was in the process of getting her [facility] Card renewed and she is still in the process of getting her [employer] renewed.  She says she is going to try and make an appt by next week.  I told her we would send a message to her doctor and let him make that decision.

## 2011-09-30 NOTE — Telephone Encounter (Signed)
Refilled med

## 2011-11-16 ENCOUNTER — Other Ambulatory Visit: Payer: Self-pay | Admitting: Family Medicine

## 2011-11-16 DIAGNOSIS — F329 Major depressive disorder, single episode, unspecified: Secondary | ICD-10-CM

## 2011-11-16 MED ORDER — PAROXETINE HCL 40 MG PO TABS: 40.0000 mg | ORAL_TABLET | Freq: Two times a day (BID) | ORAL | Status: DC

## 2011-11-16 NOTE — Telephone Encounter (Signed)
Patient is calling after contacting pharmacy for a refill on Paxil last week Thursday and was told that the request would be sent.  She is now completely out of the medication, has been without for 3 days and needs this refill asap.  Pharmacy is [facility] on [address].

## 2011-11-22 ENCOUNTER — Ambulatory Visit (INDEPENDENT_AMBULATORY_CARE_PROVIDER_SITE_OTHER): Payer: Self-pay | Admitting: Sports Medicine

## 2011-11-22 ENCOUNTER — Encounter: Payer: Self-pay | Admitting: Sports Medicine

## 2011-11-22 VITALS — BP 147/86 | HR 63 | Temp 98.6°F | Ht 65.5 in | Wt 244.6 lb

## 2011-11-22 DIAGNOSIS — E018 Other iodine-deficiency related thyroid disorders and allied conditions: Secondary | ICD-10-CM

## 2011-11-22 DIAGNOSIS — E079 Disorder of thyroid, unspecified: Secondary | ICD-10-CM

## 2011-11-22 DIAGNOSIS — K2960 Other gastritis without bleeding: Secondary | ICD-10-CM | POA: Insufficient documentation

## 2011-11-22 DIAGNOSIS — M549 Dorsalgia, unspecified: Secondary | ICD-10-CM

## 2011-11-22 DIAGNOSIS — E039 Hypothyroidism, unspecified: Secondary | ICD-10-CM

## 2011-11-22 DIAGNOSIS — I10 Essential (primary) hypertension: Secondary | ICD-10-CM

## 2011-11-22 DIAGNOSIS — F329 Major depressive disorder, single episode, unspecified: Secondary | ICD-10-CM

## 2011-11-22 DIAGNOSIS — E669 Obesity, unspecified: Secondary | ICD-10-CM

## 2011-11-22 DIAGNOSIS — R300 Dysuria: Secondary | ICD-10-CM

## 2011-11-22 MED ORDER — RANITIDINE HCL 150 MG PO CAPS: 150.0000 mg | ORAL_CAPSULE | Freq: Two times a day (BID) | ORAL | Status: DC

## 2011-11-22 MED ORDER — LEVOTHYROXINE SODIUM 200 MCG PO TABS: 200.0000 ug | ORAL_TABLET | Freq: Every day | ORAL | Status: DC

## 2011-11-22 MED ORDER — HYDROCHLOROTHIAZIDE 12.5 MG PO CAPS: 25.0000 mg | ORAL_CAPSULE | Freq: Every day | ORAL | Status: DC

## 2011-11-22 MED ORDER — GABAPENTIN 300 MG PO CAPS: 300.0000 mg | ORAL_CAPSULE | Freq: Three times a day (TID) | ORAL | Status: DC

## 2011-11-22 NOTE — Assessment & Plan Note (Signed)
Has been out of her HCTZ; refill today.  Followup blood pressure with PCP and less than 1 month

## 2011-11-22 NOTE — Progress Notes (Signed)
  [facility]  Patient name: Cheryl Castro MRN [id number]  Date of birth: Nov 21, 1950  CC & HPI  Cheryl Castro is a 61 y.o. female presenting today for follow-up of her chronic abdominal pain.  Patient reports a midepigastric pain that is a dull boring sensation.  She reports it is worse while riding in a car.  It does not bother her more with exertion.  She has no shortness of breath and no PND no orthopnea.  She reports not feeling a PPI and has not tried it anti-reflux meds.    She also reports issues with weight gain.  She denies having her thyroid level checked recently, but has been taking her thyroid medication.    Just reports that her abdominal pain is most likely related to her nerves and she is doing better.  Now that she is back on her Paxil  HYPERTENSION: not taking medications regularly as instructed, no medication side effects noted, no TIA's, no chest pain on exertion, noting some chest pains/abdominal described as above, no dyspnea on exertion and no swelling of ankles.   Thyroid ROS: fatigue, weight gain and anxiousness.  Lab Results  Component Value Date   TSH 10.145* 06/24/2010   ROS  Per AVS    Pertinent History Reviewed  Medical & Surgical Hx:  Reviewed: Significant for hypothyroidism, GERD, HTN Medications: Reviewed & Updated - see associated section Social History: Reviewed - Significant for occasional smoker  Objective Findings  Vitals:  Filed Vitals:   11/22/11 1601  BP: 147/86  Pulse: 63  Temp: 98.6 F (37 C)   PE: GENERAL:  Adult AA female.  Examined in [facility].  In no discomfort; norespiratory distress.   PSYCH: Alert and appropriately interactive; Insight:Good   H&N: AT/[employer], MMM, no scleral icterus, EOMi THORAX:  HEART: RRR, S1/S2 heard, no murmur LUNGS: CTA B, no wheezes, no crackles Abdomen: +BS, soft, non-tender, no rigidity, no guarding, no masses/organomegaly EXTREMITIES: Moves all 4 extremities spontaneously, warm  well perfused, no edema, bilateral DP and PT pulses 2/4.

## 2011-11-22 NOTE — Assessment & Plan Note (Signed)
We'll check hemoglobin A1c as well as lipid panel

## 2011-11-22 NOTE — Assessment & Plan Note (Signed)
Continues on Paxil

## 2011-11-22 NOTE — Assessment & Plan Note (Addendum)
Appears previously poorly controlled.  Patient reports increase in her Dosage with Dr. Rivka Safer.  Will check today, has been on same 200 mcg dose since her last visit, denies any missed doses

## 2011-11-22 NOTE — Assessment & Plan Note (Signed)
She was prescribed PPI previously but was unable to fill it.  I will write for H2 blocker today to attempt to treat her abdominal discomfort.  Consider cardiac source and potential for referral if no improvement.   Red flags  of MI reviewed with the patient she voices understanding.

## 2011-11-22 NOTE — Patient Instructions (Addendum)
It was nice to meet you today.  Please follow up with Dr. Cristal Ford for a PAP smear and to further discuss your ongoing health care needs.  I have refilled your medicines and sent in a prescription for Zantac to your pharmacy for your heart burn.

## 2011-11-23 ENCOUNTER — Other Ambulatory Visit: Payer: Self-pay | Admitting: Sports Medicine

## 2011-11-23 ENCOUNTER — Encounter: Payer: Self-pay | Admitting: Sports Medicine

## 2012-07-03 ENCOUNTER — Other Ambulatory Visit: Payer: Self-pay | Admitting: Family Medicine

## 2012-07-18 ENCOUNTER — Other Ambulatory Visit: Payer: Self-pay | Admitting: Family Medicine

## 2012-08-01 ENCOUNTER — Other Ambulatory Visit: Payer: Self-pay | Admitting: Family Medicine

## 2012-08-01 ENCOUNTER — Telehealth: Payer: Self-pay | Admitting: Family Medicine

## 2012-08-01 MED ORDER — ATENOLOL 50 MG PO TABS: 50.0000 mg | ORAL_TABLET | Freq: Two times a day (BID) | ORAL | Status: DC

## 2012-08-01 NOTE — Telephone Encounter (Signed)
Please inform her it has been sent to pharmacy.

## 2012-08-01 NOTE — Telephone Encounter (Signed)
Patient has an appt scheduled for 4/9 and is asking for enough Atenolol to last until that appt to be sent to [facility] on [address].  Please call when this has been doen.

## 2012-08-01 NOTE — Telephone Encounter (Signed)
Spoke with patient and informed her of below 

## 2012-08-09 ENCOUNTER — Ambulatory Visit: Payer: Self-pay | Admitting: Family Medicine

## 2012-08-15 ENCOUNTER — Other Ambulatory Visit: Payer: Self-pay | Admitting: Family Medicine

## 2012-08-16 ENCOUNTER — Other Ambulatory Visit: Payer: Self-pay | Admitting: Family Medicine

## 2012-08-16 NOTE — Telephone Encounter (Signed)
Pt has appt on may 28 and is asking for enough to last until appt.

## 2012-08-16 NOTE — Telephone Encounter (Signed)
LVM for patient to call back    Patient was already given a limited supply last month but canceled her appointments. This is a recurrent issue and I have never even met her. Patient cannot get medication requested refilled due to this. She needs to be at an office visit before refill will be done.

## 2012-08-17 ENCOUNTER — Encounter: Payer: Self-pay | Admitting: Family Medicine

## 2012-08-17 ENCOUNTER — Ambulatory Visit (INDEPENDENT_AMBULATORY_CARE_PROVIDER_SITE_OTHER): Payer: Self-pay | Admitting: Family Medicine

## 2012-08-17 VITALS — BP 126/78 | HR 70 | Ht 65.0 in | Wt 249.0 lb

## 2012-08-17 DIAGNOSIS — E018 Other iodine-deficiency related thyroid disorders and allied conditions: Secondary | ICD-10-CM

## 2012-08-17 DIAGNOSIS — E039 Hypothyroidism, unspecified: Secondary | ICD-10-CM

## 2012-08-17 DIAGNOSIS — I10 Essential (primary) hypertension: Secondary | ICD-10-CM

## 2012-08-17 DIAGNOSIS — K2960 Other gastritis without bleeding: Secondary | ICD-10-CM

## 2012-08-17 DIAGNOSIS — E669 Obesity, unspecified: Secondary | ICD-10-CM

## 2012-08-17 DIAGNOSIS — F329 Major depressive disorder, single episode, unspecified: Secondary | ICD-10-CM

## 2012-08-17 LAB — CBC
HCT: 43.4 % (ref 36.0–46.0)
MCV: 86.1 fL (ref 78.0–100.0)
Platelets: 355 10*3/uL (ref 150–400)
RBC: 5.04 MIL/uL (ref 3.87–5.11)
WBC: 7.1 10*3/uL (ref 4.0–10.5)

## 2012-08-17 MED ORDER — LEVOTHYROXINE SODIUM 200 MCG PO TABS: 200.0000 ug | ORAL_TABLET | Freq: Every day | ORAL | Status: DC

## 2012-08-17 MED ORDER — PAROXETINE HCL 40 MG PO TABS: ORAL_TABLET | ORAL | Status: DC

## 2012-08-17 MED ORDER — RANITIDINE HCL 150 MG PO CAPS: 150.0000 mg | ORAL_CAPSULE | Freq: Two times a day (BID) | ORAL | Status: DC

## 2012-08-17 MED ORDER — HYDROCHLOROTHIAZIDE 12.5 MG PO CAPS: 12.5000 mg | ORAL_CAPSULE | Freq: Every day | ORAL | Status: DC

## 2012-08-17 MED ORDER — ATENOLOL 50 MG PO TABS: 50.0000 mg | ORAL_TABLET | Freq: Two times a day (BID) | ORAL | Status: DC

## 2012-08-17 NOTE — Patient Instructions (Addendum)
Nice to meet you. I have refilled medications. If labs come back normal, will send you a letter.  Goal for blood pressure is <140/90.  I am sorry your are stressed.  If your therapy doesn't help, please call again for advise.  If you add exercise or walking, this could really help.  Can use tylenol (acetaminophen) up to 650 mg 4 times daily for pain. Make an appointment for check up in 4 months.

## 2012-08-17 NOTE — Progress Notes (Signed)
  Subjective:    Patient ID: Cheryl Castro, female    DOB: Oct 30, 1950, 62 y.o.   MRN: [id number]  Hypertension    1. HTN. F/u for med refills after running out. The pharmacy gave her a few pills, so she would not completely run out before her visit. Due for lab work. Does not check blood pressure at home. Has been compliant with atenolol. But is taking the hydrochlorothiazide prn. States she did feel palpitations when she missed a dose of medication, otherwise denies chest pain, dyspnea, leg swelling.  2. Anxiety/depression. Thinks her anxiety is worsening recently. She is compliant with Paxil, and has been on this dose for 7-8 years. She plans to establish with a therapist at [facility] soon. She uses religion as her current coping mechanism. No current SI/HI  3. Weight gain. Also uses food as a coping mechanism for anxiety. She is eating snacks more often after she stopped drinking alcohol. Asks for an appetite suppressant. She is sedentary and does not exercise regularly.  Review of Systems See HPI otherwise negative.  reports that she has never smoked. She does not have any smokeless tobacco history on file.     Objective:   Physical Exam  Vitals reviewed. Constitutional: She is oriented to person, place, and time. She appears well-developed and well-nourished. No distress.  HENT:  Head: Normocephalic and atraumatic.  Eyes: EOM are normal. Pupils are equal, round, and reactive to light.  Neck: Neck supple. No thyromegaly present.  Cardiovascular: Normal rate, regular rhythm and normal heart sounds.   No murmur heard. Pulmonary/Chest: Effort normal and breath sounds normal. No respiratory distress. She has no wheezes.  Abdominal: Bowel sounds are normal.  Musculoskeletal: She exhibits no edema and no tenderness.  Lymphadenopathy:    She has no cervical adenopathy.  Neurological: She is alert and oriented to person, place, and time.  Skin: She is not diaphoretic.  Psychiatric:   Flat affect       Assessment & Plan:

## 2012-08-17 NOTE — Assessment & Plan Note (Signed)
Above goal the to medication noncompliance. Check labs today. Restart daily hydrochlorothiazide. Would consider addition of ACE inhibitor or Norvasc if remains uncontrolled.

## 2012-08-17 NOTE — Assessment & Plan Note (Signed)
Check TSH, refilled medication.

## 2012-08-17 NOTE — Assessment & Plan Note (Signed)
Endorses worsening anxiety. First time to meet patient today, but we agree that psychotherapy would be the best initial addition to her management currently. F/u if fails to improve, could consider med changes.

## 2012-08-17 NOTE — Assessment & Plan Note (Signed)
Discussed using exercise for anxiety management and weight loss to improve chronic medical conditions. Check labs today.

## 2012-08-18 ENCOUNTER — Telehealth: Payer: Self-pay | Admitting: Family Medicine

## 2012-08-18 LAB — COMPREHENSIVE METABOLIC PANEL
BUN: 14 mg/dL (ref 6–23)
CO2: 28 mEq/L (ref 19–32)
Creat: 0.92 mg/dL (ref 0.50–1.10)
Glucose, Bld: 106 mg/dL — ABNORMAL HIGH (ref 70–99)
Total Bilirubin: 0.5 mg/dL (ref 0.3–1.2)

## 2012-08-18 NOTE — Telephone Encounter (Signed)
Please inform patient her labs were normal except for thyroid. Please ask if she has missed ANY of her thyroid medication doses in the past few months, or if she had run out of this medication. If not missed any doses, then we need to increase her current dose.

## 2012-08-21 ENCOUNTER — Telehealth: Payer: Self-pay | Admitting: *Deleted

## 2012-08-21 NOTE — Telephone Encounter (Signed)
Thank you. That explains the labs. Will check labs again in 8 weeks.

## 2012-08-21 NOTE — Telephone Encounter (Signed)
LMOM for pt to rt call 

## 2012-08-21 NOTE — Telephone Encounter (Signed)
Left message on voicemail for a return call. Cheryl Castro  

## 2012-08-21 NOTE — Telephone Encounter (Signed)
Dr Cristal Ford patient states she has been  without medication for a while.I did related message and explained that this was the reason for abnormal tsh  level.Pt voiced understanding and states she will pick up thyroid medication this afternoon Please advise. Cheryl Castro, Virgel Bouquet

## 2012-08-22 NOTE — Telephone Encounter (Signed)
Pt informed. Cheryl Castro S  

## 2012-09-27 ENCOUNTER — Ambulatory Visit: Payer: Self-pay | Admitting: Family Medicine

## 2013-01-02 ENCOUNTER — Encounter: Payer: Self-pay | Admitting: Family Medicine

## 2013-01-02 ENCOUNTER — Ambulatory Visit (INDEPENDENT_AMBULATORY_CARE_PROVIDER_SITE_OTHER): Payer: No Typology Code available for payment source | Admitting: Family Medicine

## 2013-01-02 VITALS — BP 147/90 | HR 62 | Temp 98.0°F | Ht 65.0 in | Wt 253.4 lb

## 2013-01-02 DIAGNOSIS — F411 Generalized anxiety disorder: Secondary | ICD-10-CM

## 2013-01-02 DIAGNOSIS — L299 Pruritus, unspecified: Secondary | ICD-10-CM

## 2013-01-02 DIAGNOSIS — F329 Major depressive disorder, single episode, unspecified: Secondary | ICD-10-CM

## 2013-01-02 DIAGNOSIS — I10 Essential (primary) hypertension: Secondary | ICD-10-CM

## 2013-01-02 DIAGNOSIS — K2960 Other gastritis without bleeding: Secondary | ICD-10-CM

## 2013-01-02 DIAGNOSIS — E039 Hypothyroidism, unspecified: Secondary | ICD-10-CM

## 2013-01-02 MED ORDER — HYDROXYZINE HCL 10 MG PO TABS: 10.0000 mg | ORAL_TABLET | Freq: Three times a day (TID) | ORAL | Status: DC | PRN

## 2013-01-02 NOTE — Assessment & Plan Note (Signed)
Has not been taking zantac regularly, but says it helps when she does take it - so no change in therapy today. She was advised to continue taking this everyday and she has many refills left.

## 2013-01-02 NOTE — Assessment & Plan Note (Signed)
Encouraged psychological assessment by [facility] for non-medical management in addition to paxil. Given obesity, and predominance of neurovegetative symptoms of depression/anxiety, will likely cautiously change to activating SSRI/SNRI at next visit. Hydroxyzine for itching will likely help with insomnia. No new medications at this time as financial considerations already limit medical adherence, and no change without knowing extent of hypothyroidism contribution.

## 2013-01-02 NOTE — Patient Instructions (Addendum)
It was so nice to meet you! Here's what we decided today:  Take your medications, especially levothyroxine (synthroid), everyday. Controlling your thyroid disorder will likely make all of your other concerns get better. For anxiety, continue taking PAXIL (paroxetine) everyday and keep your assessment appointment with [facility]. Both of these will help you. We can discuss changing medications in 1 month after you've been taking your medications everyday. You can also take hydroxyzine as needed for itching.  Try to get some exercise everyday, even if just a little bit. This will improve your mood tremendously and also help you with your back pain.

## 2013-01-02 NOTE — Assessment & Plan Note (Signed)
Grossly uncontrolled due to failure to take synthroid daily. Stressed the importance of having normal thyroid hormone levels in maintaining mood, and controlling many of her somatic complaints. No use in TSH check today. Will follow up in 1 month and recheck if reported compliance at that time.

## 2013-01-02 NOTE — Progress Notes (Signed)
  Subjective:    Patient ID: Cheryl Castro, female    DOB: 1950-06-14, 62 y.o.   MRN: [id number]  HPI  Cheryl Castro is a 62 year old female here with the chief complaint of anxiety.   She has had anxiety about social situations and about many interactions with the world. She is not afraid to leave her house. Anxiety started many years ago, not precipitated by a specific event. She has many associated symptoms during times of greater stress including palpitations, chest tightness, constipation, burning epigastric pain, and memory problems. She has been referred to [facility] for assessment but has not gone yet.   She has a history of not taking medications for financial reasons, and admits she has not taken synthroid in a "while." She also does not take HCTZ every day. She does take paxil every day.   She has been trying to lose weight and asks about diet suppressants. Does not exercise, and finds that she will eat even when not hungry.   Review of Systems As above, and lower back pain worse with exercise. 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     Objective:   Physical Exam  Constitutional: She is oriented to person, place, and time. She appears well-developed and well-nourished. No distress.  HENT:  Head: Normocephalic.  Eyes: Conjunctivae are normal. Pupils are equal, round, and reactive to light.  Neck: No thyromegaly present.  Cardiovascular:  Distant heart sounds normal rate with no murmur heard   Pulmonary/Chest: Effort normal and breath sounds normal. She has no wheezes.  Abdominal: Soft. Bowel sounds are normal.  Obese, non-tender, non-distended  Musculoskeletal: Normal range of motion.  Lymphadenopathy:    She has no cervical adenopathy.  Neurological: She is alert and oriented to person, place, and time. No cranial nerve deficit.  Normal gait, normal speech  Skin: She is not diaphoretic.  Psychiatric: Her behavior is normal.      Assessment & Plan:  Cheryl Castro is a 62 year old female for the primary problem of anxiety.   PHQ-9 score: 21, making work and maintaining home/social life "somewhat difficult" GAD-7 scale: 16, making work and maintaining home/social life "extremely difficult"

## 2013-01-02 NOTE — Assessment & Plan Note (Signed)
147/90 today. Does not check at home, is intermittently noncompliant with HCTZ. Beta blocker compliance likely related to side effects. No therapy change today. Will check CMP.

## 2013-01-30 ENCOUNTER — Encounter: Payer: Self-pay | Admitting: Family Medicine

## 2013-01-30 ENCOUNTER — Ambulatory Visit (INDEPENDENT_AMBULATORY_CARE_PROVIDER_SITE_OTHER): Payer: No Typology Code available for payment source | Admitting: Family Medicine

## 2013-01-30 VITALS — BP 153/91 | HR 68 | Temp 98.2°F | Ht 65.0 in | Wt 247.0 lb

## 2013-01-30 DIAGNOSIS — E039 Hypothyroidism, unspecified: Secondary | ICD-10-CM

## 2013-01-30 DIAGNOSIS — F3181 Bipolar II disorder: Secondary | ICD-10-CM | POA: Insufficient documentation

## 2013-01-30 DIAGNOSIS — I10 Essential (primary) hypertension: Secondary | ICD-10-CM

## 2013-01-30 DIAGNOSIS — F3189 Other bipolar disorder: Secondary | ICD-10-CM

## 2013-01-30 DIAGNOSIS — F329 Major depressive disorder, single episode, unspecified: Secondary | ICD-10-CM

## 2013-01-30 LAB — COMPREHENSIVE METABOLIC PANEL
ALT: 40 U/L — ABNORMAL HIGH (ref 0–35)
CO2: 29 mEq/L (ref 19–32)
Chloride: 100 mEq/L (ref 96–112)
Sodium: 137 mEq/L (ref 135–145)
Total Bilirubin: 0.5 mg/dL (ref 0.3–1.2)
Total Protein: 7.2 g/dL (ref 6.0–8.3)

## 2013-01-30 LAB — TSH: TSH: 9.222 u[IU]/mL — ABNORMAL HIGH (ref 0.350–4.500)

## 2013-01-30 NOTE — Progress Notes (Signed)
Patient ID: Cheryl Castro, female   DOB: 1950/12/28, 62 y.o.   MRN: [id number] Subjective:  HPI:   Cheryl Castro is a 62 y.o. female with h/o hypothyroidism and anxiety here for follow up of these conditions.   Cheryl Castro reports taking synthroid every day since her previous visit and reports mild improvement in her overall mood.  Endocrine ROS: negative for - palpitations, polydipsia/polyuria, skin changes, temperature intolerance or unexpected weight changes  She describes no change in her anxiety at this time. She reports compliance with paxil 60mg  per day and was seen at an introductory appointment at [facility] where she was diagnosed with bipolar II and social phobia. They prescribed abilify at that visit and prescribed a reduced dose of paxil (20mg ). She has not followed these directions yet, but plans to be seen there twice per month. She reports apathy and amotivational symptoms as well as decreased libido.   She has no symptoms of DIGFAST. Reports the following every day for last two weeks:  Sleep disturbances Insomnia  Interest loss +  Guilt -  Energy decreased +  Cognition/Concentration Problems +  Appetite Change +  Psychomotor Agitation +  Suicide -    Review of Systems:  Per HPI. All other systems reviewed and are negative.    Objective:  Physical Exam: BP 153/91  Pulse 68  Temp(Src) 98.2 F (36.8 C) (Oral)  Ht 5\' 5"  (1.651 m)  Wt 247 lb (112.038 kg)  BMI 41.1 kg/m2  Gen: 62 y.o. female in NAD HEENT: MMM, EOMI, PERRL CV: RRR, no MRG Resp: Non-labored, CTAB, no wheezes noted Abd: Soft, NTND, BS present, no guarding or organomegaly MSK: No edema noted, full ROM Neuro: Alert and oriented, CN II-XII grossly intact, speech normal Psych: Mood "fatigued" affect congruent, limited. Psychomotor slowing noted with limited cognition. Judgment intact, insight poor.  Assessment:     Cheryl Castro is a 62 y.o. female with h/o hypothyroidism and anxiety  currently being actively managed jointly with [facility].     Plan:     See problem list for problem-specific plans.

## 2013-01-30 NOTE — Assessment & Plan Note (Signed)
Has begun therapeutic relationship at [facility]. Starting reduced dose paxil (20mg  daily) in a taper off. May replace with SNRI/wellbutrin. Agree with abilify. Attaining records from [facility]/will contact psychiatrist to discuss case and plans. Unsure of status of hypothyroidism and its contribution. Continue close follow up.

## 2013-01-30 NOTE — Patient Instructions (Signed)
Thank you for coming in today!  We are checking some labs today, and I will call you if they are abnormal. If you do not hear from me by phone or letter in 2 weeks, please call us as I may have been unable to reach you.   As you leave, make an appointment to follow up with me in 1 month.  - Bring all medications in a bag to your visits. - Take your medications as directed, including paxil 20mg  once per day and abilify.   Take care and seek immediate care sooner than 1 month if you develop any concerns.  Please feel free to call with any questions or concerns at any time, at [phone]. - Dr. Jarvis Newcomer

## 2013-01-30 NOTE — Assessment & Plan Note (Signed)
153/91 today, BPs in office elevated since April 2014. Does not check at home. Reports 50% compliance with HCTZ, but takes atenolol (likely as adjunct to anxiety tx). Checking CMP today. Cannot guide therapy without ensured compliance.

## 2013-01-30 NOTE — Assessment & Plan Note (Signed)
Checking TSH today to assess compliance and appropriateness of dose. Will call with results.

## 2013-02-21 ENCOUNTER — Encounter: Payer: Self-pay | Admitting: Family Medicine

## 2013-02-21 ENCOUNTER — Ambulatory Visit (INDEPENDENT_AMBULATORY_CARE_PROVIDER_SITE_OTHER): Payer: No Typology Code available for payment source | Admitting: Family Medicine

## 2013-02-21 VITALS — BP 143/84 | HR 69 | Temp 98.1°F | Wt 248.0 lb

## 2013-02-21 DIAGNOSIS — E669 Obesity, unspecified: Secondary | ICD-10-CM

## 2013-02-21 DIAGNOSIS — F418 Other specified anxiety disorders: Secondary | ICD-10-CM

## 2013-02-21 DIAGNOSIS — F411 Generalized anxiety disorder: Secondary | ICD-10-CM

## 2013-02-21 DIAGNOSIS — F3189 Other bipolar disorder: Secondary | ICD-10-CM

## 2013-02-21 DIAGNOSIS — F3181 Bipolar II disorder: Secondary | ICD-10-CM

## 2013-02-21 DIAGNOSIS — G4700 Insomnia, unspecified: Secondary | ICD-10-CM

## 2013-02-21 DIAGNOSIS — F341 Dysthymic disorder: Secondary | ICD-10-CM

## 2013-02-21 MED ORDER — TRAZODONE HCL 50 MG PO TABS: 25.0000 mg | ORAL_TABLET | Freq: Every evening | ORAL | Status: DC | PRN

## 2013-02-21 NOTE — Assessment & Plan Note (Signed)
Discussed benefit of exercise of helping with psychiatric symptoms and that it may be causing hypoventilation during sleep. Will order a sleep study and follow up on exercise goals.

## 2013-02-21 NOTE — Progress Notes (Signed)
  Subjective:     Cheryl Castro is a 62 y.o. female who presents for follow up of anxiety disorder. Current symptoms: difficulty concentrating, fatigue, insomnia, irritable. She has previously been unable to maintain consistent employment due to anxiety previously. She denies current suicidal and homicidal ideation. She has been seen at [facility]. She was prescribed paxil 20mg  and abilify, which she has been taking as directed.   She reports recent mood swings including impulsivity, feelings of wanting to scream, intermittently mixed with transient sadness. She reports some perseveration during these times on sad episodes.Her amotivational symptoms have improved. She has been able to feel more motivated to clean around the house, finish online schoolwork.  She endorses aching, moderate, nonradiating back pain "feels like the back of my chest in my vertebrae" only at night when she lays down to go to sleep. It started the 2nd day after starting abilify. Is only able to sleep with the use of allergy medications.    The following portions of the patient's history were reviewed and updated as appropriate: allergies, current medications, past family history, past medical history, past social history, past surgical history and problem list.    Objective:    BP 143/84  Pulse 69  Temp(Src) 98.1 F (36.7 C) (Oral)  Wt 248 lb (112.492 kg)  BMI 41.27 kg/m2  General:  alert, cooperative and fatigued  Affect/Behavior:  good grooming, normal perception, normal reasoning and normal speech pattern and content flattened affect    PHQ-9 - Difficulty: Somewhat  1 2 3 4 5 6 7 8 9  Total:  1 3 3  0 2 2 1  0 1 13     GAD 7 - Difficulty: Somewhat  1 2 3 4 5 6 7  Total:  1 2 2 2 1 3  0 11     Assessment:    Anxiety Disorder - improving    Plan:  See problem list for problem-specific plan.  Declines flu shot today.

## 2013-02-21 NOTE — Assessment & Plan Note (Signed)
Follows up with [facility] later today. I recommended coming off paxil as it may be contributing to the impulsivity and other hypomanic symptoms she describes. Continue abilify. Recheck TSH in 1 month as hypothyroidism may still contribute to symptoms. Discussed the option of low-dose trazodone for sleep, but concern for sleep apnea limits ability to use sedative medications.

## 2013-02-21 NOTE — Patient Instructions (Signed)
Thank you for coming in today!  Please follow up with [facility] today. You could follow up with them twice per month as talk therapy helps more than medications alone.  I recommend using your CPAP every night.  Try trazodone once per night.  Take synthroid every day and we will check that level at your 1 month follow up.    As you leave, make an appointment to follow up with me in 1 month.  - Bring all medications in a bag to your visits.   Take care and seek immediate care sooner than 1 month if you develop any concerns.  Please feel free to call with any questions or concerns at any time, at [phone]. - Dr. Jarvis Newcomer

## 2013-02-21 NOTE — Assessment & Plan Note (Addendum)
Defer medical management to [facility], which pt is committed to at this time. PHQ 13, GAD-7: 9. Showing a lot of improvement.

## 2013-03-23 ENCOUNTER — Ambulatory Visit: Payer: No Typology Code available for payment source | Admitting: Family Medicine

## 2013-03-26 ENCOUNTER — Ambulatory Visit: Payer: No Typology Code available for payment source | Admitting: Family Medicine

## 2013-04-13 ENCOUNTER — Encounter: Payer: Self-pay | Admitting: Family Medicine

## 2013-04-13 ENCOUNTER — Ambulatory Visit (INDEPENDENT_AMBULATORY_CARE_PROVIDER_SITE_OTHER): Payer: No Typology Code available for payment source | Admitting: Family Medicine

## 2013-04-13 VITALS — BP 140/81 | HR 68 | Temp 97.7°F | Ht 65.0 in | Wt 251.6 lb

## 2013-04-13 DIAGNOSIS — E669 Obesity, unspecified: Secondary | ICD-10-CM

## 2013-04-13 DIAGNOSIS — G4700 Insomnia, unspecified: Secondary | ICD-10-CM

## 2013-04-13 DIAGNOSIS — E018 Other iodine-deficiency related thyroid disorders and allied conditions: Secondary | ICD-10-CM

## 2013-04-13 DIAGNOSIS — E039 Hypothyroidism, unspecified: Secondary | ICD-10-CM

## 2013-04-13 DIAGNOSIS — F3189 Other bipolar disorder: Secondary | ICD-10-CM

## 2013-04-13 DIAGNOSIS — Z1211 Encounter for screening for malignant neoplasm of colon: Secondary | ICD-10-CM

## 2013-04-13 DIAGNOSIS — F411 Generalized anxiety disorder: Secondary | ICD-10-CM

## 2013-04-13 DIAGNOSIS — L299 Pruritus, unspecified: Secondary | ICD-10-CM

## 2013-04-13 DIAGNOSIS — F3181 Bipolar II disorder: Secondary | ICD-10-CM

## 2013-04-13 DIAGNOSIS — I10 Essential (primary) hypertension: Secondary | ICD-10-CM

## 2013-04-13 DIAGNOSIS — Z0000 Encounter for general adult medical examination without abnormal findings: Secondary | ICD-10-CM

## 2013-04-13 DIAGNOSIS — Z1239 Encounter for other screening for malignant neoplasm of breast: Secondary | ICD-10-CM

## 2013-04-13 LAB — COMPLETE METABOLIC PANEL WITH GFR
ALT: 19 U/L (ref 0–35)
BUN: 16 mg/dL (ref 6–23)
CO2: 30 mEq/L (ref 19–32)
Creat: 0.76 mg/dL (ref 0.50–1.10)
GFR, Est African American: >89 mL/min
GFR, Est Non African American: 85 mL/min
Glucose, Bld: 74 mg/dL (ref 70–99)
Total Bilirubin: 0.4 mg/dL (ref 0.3–1.2)

## 2013-04-13 LAB — TSH: TSH: 3.179 u[IU]/mL (ref 0.350–4.500)

## 2013-04-13 MED ORDER — HYDROXYZINE HCL 10 MG PO TABS: 20.0000 mg | ORAL_TABLET | Freq: Every day | ORAL | Status: DC

## 2013-04-13 MED ORDER — TRAZODONE HCL 50 MG PO TABS: 25.0000 mg | ORAL_TABLET | Freq: Every evening | ORAL | Status: DC | PRN

## 2013-04-13 MED ORDER — TOPIRAMATE 50 MG PO TABS: 50.0000 mg | ORAL_TABLET | Freq: Two times a day (BID) | ORAL | Status: DC

## 2013-04-13 NOTE — Progress Notes (Signed)
Patient ID: Cheryl Castro, female   DOB: [birth year], 62 y.o.   MRN: [id number] Subjective:  HPI:   Cheryl Castro is a 62 y.o. female with a history of bipolar II disorder, obesity, and hypothyroidism here for follow up.   She reports short (10 seconds) intermittent times of hopelessness and worthlessness that go away spontaneously. She also reports feelings of impulsivity, as though she may just scream. She recently was seen at [facility] and they increased her paxil to 40mg , though she believes this isn't going to help and that abilify is causing most of her symptoms, though it helped when it was started. Therapy at [facility] is also helping her.  She also endorses compulsive eating patterns and overeating due to always being hungry. She strongly feels that an appetite suppressant would aid in losing weight. She has brought this up at many previous office visits. She continues not to exercise and says she eats generally healthy foods but too much of them.  She has been taking synthroid as directed.   Denies fever, chills, CP, SOB, palpitations, heat/cold intolerance, N/V/D. No hallucinations, delusions, HI, SI.   Review of Systems:  Per HPI. All other systems reviewed and are negative.    Objective:  Physical Exam: BP 140/81  Pulse 68  Temp(Src) 97.7 F (36.5 C) (Oral)  Ht 5\' 5"  (1.651 m)  Wt 251 lb 9.6 oz (114.125 kg)  BMI 41.87 kg/m2  Gen: 62 y.o. female in NAD HEENT: MMM, EOMI, PERRL, anicteric sclerae CV: RRR, no MRG Resp: Non-labored, CTAB, no wheezes noted Abd: Soft, NTND, BS present, no guarding or organomegaly MSK: No edema noted, full ROM Neuro: Alert and oriented, speech normal Psych: Appears worried, well dressed and groomed. Depressed mood with congruent affect.     PHQ-9 - Difficulty: Somewhat difficult  1 2 3 4 5 6 7 8 9  Total:  1 2 3 1 3 3 1  0 1 15   GAD 7 - Difficulty: Very difficult  1 2 3 4 5 6 7  Total:  1 2 3 3 3 3 3 18    Assessment:     Cheryl Castro  is a 62 y.o. female here for follow up of chronic medical conditions and requesting appetite suppressant.     Plan:     See problem list for problem-specific plans.

## 2013-04-13 NOTE — Assessment & Plan Note (Signed)
At goal today, endorsing good compliance. Will continue current treatment.

## 2013-04-13 NOTE — Assessment & Plan Note (Addendum)
Medical and psychotherapy per [facility]. Recent increase in paxil. Will defer all management to them. Continuing trazodone for sleep and correcting hypothyroidism. PHQ9: 15, improved from 19. GAD-7: 18, much worse from previous 9. Will continue monitoring. No recent manic symptoms.

## 2013-04-13 NOTE — Assessment & Plan Note (Signed)
Given hemoccult cards today.  Given mammography referral today.  Declines flu shot.

## 2013-04-13 NOTE — Assessment & Plan Note (Signed)
Pt still not exercising, requesting appetite suppressant. Rx topamax trial to see if this will help, though comprehensively reviewed the role of exercise in treating her conditions and my reservations on the long term efficacy of these medications. Hypothyroidism a minor culprit if at all, as she has continued to gain weight as TSH corrects.

## 2013-04-13 NOTE — Assessment & Plan Note (Signed)
Last TSH 9.222, continued reported compliance, so will recheck today and adjust dose as necessary.

## 2013-04-13 NOTE — Assessment & Plan Note (Signed)
Recent bilirubin 0.5. Worst on whole back at night. Improved with atarax qHS. No signs of eczema, dermatitis, or other primary cutaneous lesion. Likely psychogenic.

## 2013-04-13 NOTE — Patient Instructions (Addendum)
Thank you for coming in today!  Keep taking synthroid at the same dose until we get the lab work back. We may need to change the dose based on the labs.  Continue taking the paxil and abilify as prescribed by [facility].  Take topamax once per day as directed for appetite suppression. Please get the mammogram done soon.  Also make sure you bring the hemoccult cards back to the clinic so we can test them.   As you leave, make an appointment to follow up with me in 1 month.  - Bring all medications in a bag to your visits.  Take care and seek immediate care sooner if you develop any concerns.  Please feel free to call with any questions or concerns at any time, at [phone]. - Dr. Jarvis Newcomer

## 2013-04-16 ENCOUNTER — Encounter: Payer: Self-pay | Admitting: Family Medicine

## 2013-05-18 ENCOUNTER — Other Ambulatory Visit: Payer: Self-pay | Admitting: Family Medicine

## 2013-05-24 ENCOUNTER — Ambulatory Visit: Payer: Self-pay

## 2013-06-04 ENCOUNTER — Ambulatory Visit: Payer: Self-pay

## 2013-07-25 ENCOUNTER — Ambulatory Visit: Payer: No Typology Code available for payment source | Admitting: Family Medicine

## 2013-07-30 ENCOUNTER — Ambulatory Visit (INDEPENDENT_AMBULATORY_CARE_PROVIDER_SITE_OTHER): Payer: No Typology Code available for payment source | Admitting: Family Medicine

## 2013-07-30 ENCOUNTER — Encounter: Payer: Self-pay | Admitting: Family Medicine

## 2013-07-30 VITALS — BP 137/84 | HR 68 | Ht 65.0 in | Wt 256.0 lb

## 2013-07-30 DIAGNOSIS — M549 Dorsalgia, unspecified: Secondary | ICD-10-CM

## 2013-07-30 DIAGNOSIS — M25559 Pain in unspecified hip: Secondary | ICD-10-CM

## 2013-07-30 DIAGNOSIS — M25552 Pain in left hip: Secondary | ICD-10-CM

## 2013-07-30 MED ORDER — MELOXICAM 15 MG PO TABS: 15.0000 mg | ORAL_TABLET | Freq: Every day | ORAL | Status: DC

## 2013-07-30 NOTE — Progress Notes (Signed)
   Subjective:    Patient ID: Cheryl Castro, female    DOB: 07/25/1950, 63 y.o.   MRN: [id number]  HPI  63 year old F for same day appointment with left hip pain and left lower back pain. Several weeks duration. Inhibiting her from doing normal house work. The pain is located in the lower back is relieved with sitting.The pain in her hip is constant and worsens throughout the day. It is located on the lateral hip and occasionally radiates to the groin. It is not relieved with rest and is worsened by lying on the left side. Tried tramadol from her son. It helped minimally and made her fatigued. Denies recent falls or trauma. No surgery.  PMH - hx of bursitis of left hip 9 years ago, given NSAIDS, but states that she cannot tolerated ibuprofen due to gastric side effects   Current Outpatient Prescriptions on File Prior to Visit  Medication Sig Dispense Refill  . atenolol (TENORMIN) 50 MG tablet Take 1 tablet (50 mg total) by mouth 2 (two) times daily.  60 tablet  11  . gabapentin (NEURONTIN) 300 MG capsule Take 1 capsule (300 mg total) by mouth 3 (three) times daily.  90 capsule  2  . hydrochlorothiazide (MICROZIDE) 12.5 MG capsule Take 1 capsule (12.5 mg total) by mouth daily.  30 capsule  11  . hydrOXYzine (ATARAX/VISTARIL) 10 MG tablet Take 2 tablets (20 mg total) by mouth at bedtime.  60 tablet  1  . levothyroxine (SYNTHROID, LEVOTHROID) 200 MCG tablet Take 1 tablet (200 mcg total) by mouth daily.  30 tablet  6  . PARoxetine (PAXIL) 40 MG tablet take 1 tablet by mouth once daily and 1/2 tablet by mouth EVERY AFTERNOON  60 tablet  2  . ranitidine (ZANTAC) 150 MG capsule Take 1 capsule (150 mg total) by mouth 2 (two) times daily.  60 capsule  5  . topiramate (TOPAMAX) 50 MG tablet Take 1 tablet (50 mg total) by mouth 2 (two) times daily.  30 tablet  1  . traZODone (DESYREL) 50 MG tablet Take 0.5-1 tablets (25-50 mg total) by mouth at bedtime as needed for sleep.  30 tablet  0   No current  facility-administered medications on file prior to visit.     Review of Systems     Objective:   Physical Exam  BP 137/84  Pulse 68  Ht 5\' 5"  (1.651 m)  Wt 256 lb (116.121 kg)  BMI 42.60 kg/m2 Gen: very obese middle aged F, non ill appearing  Back:  Appearance: sciolosis no Palpation: tenderness of paraspinal muscles no, spinous process no; pelvis no   Hip:  Appearance: significant adiposity makes visualizing the landmarks difficult  Palpation: tenderness of greater trochanter yes Rotation Reduced: internal no, external yes only about 30 degrees FADIR: equivoval FABER: positive   Neuro: Strength hip flexion 5/5, hip abduction 5/5, hip adduction 5/5,  knee extension 5/5, knee flexion 5/5, dorsiflexion 5/5, plantar flexion 5/5 Straight Leg Raise: negative bilaterally Sensation to light touch intact: yes      Assessment & Plan:

## 2013-07-30 NOTE — Patient Instructions (Signed)
Ms. Cheryl Castro.   Nice to meet you. I think that you are dealing with two things:   1. Bursitis of the left hip 2. 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 Additionally stretching exercises are very important. [facility] of her left hip. Followup in 2 weeks.  Sincerely,  Dr. Clinton SawyerWilliamson

## 2013-08-01 DIAGNOSIS — M25552 Pain in left hip: Secondary | ICD-10-CM | POA: Insufficient documentation

## 2013-08-01 NOTE — Assessment & Plan Note (Signed)
A: exam most consistent with bursitis, but also concern for osteoarthritis given hx P: start meloxicam, obtain X-ray of hip

## 2013-08-01 NOTE — Assessment & Plan Note (Signed)
A: this seems to localize to left SI joint P: given mobic and instructions for stretching

## 2013-08-21 ENCOUNTER — Ambulatory Visit
Admission: RE | Admit: 2013-08-21 | Discharge: 2013-08-21 | Disposition: A | Discharge status: Home / Self Care | Payer: No Typology Code available for payment source | Source: Ambulatory Visit | Attending: Family Medicine | Admitting: Family Medicine

## 2013-08-21 DIAGNOSIS — M25552 Pain in left hip: Secondary | ICD-10-CM

## 2013-08-22 ENCOUNTER — Other Ambulatory Visit: Payer: Self-pay | Admitting: Family Medicine

## 2013-08-23 ENCOUNTER — Other Ambulatory Visit: Payer: Self-pay | Admitting: Family Medicine

## 2013-08-27 ENCOUNTER — Encounter (HOSPITAL_COMMUNITY): Payer: Self-pay | Admitting: Emergency Medicine

## 2013-08-27 ENCOUNTER — Emergency Department (HOSPITAL_COMMUNITY)
Admission: EM | Admit: 2013-08-27 | Discharge: 2013-08-27 | Disposition: A | Discharge status: Home / Self Care | Payer: No Typology Code available for payment source | Attending: Emergency Medicine | Admitting: Emergency Medicine

## 2013-08-27 DIAGNOSIS — S46909A Unspecified injury of unspecified muscle, fascia and tendon at shoulder and upper arm level, unspecified arm, initial encounter: Secondary | ICD-10-CM | POA: Insufficient documentation

## 2013-08-27 DIAGNOSIS — Z79899 Other long term (current) drug therapy: Secondary | ICD-10-CM | POA: Insufficient documentation

## 2013-08-27 DIAGNOSIS — Z87891 Personal history of nicotine dependence: Secondary | ICD-10-CM | POA: Insufficient documentation

## 2013-08-27 DIAGNOSIS — Y939 Activity, unspecified: Secondary | ICD-10-CM | POA: Insufficient documentation

## 2013-08-27 DIAGNOSIS — K219 Gastro-esophageal reflux disease without esophagitis: Secondary | ICD-10-CM | POA: Insufficient documentation

## 2013-08-27 DIAGNOSIS — I10 Essential (primary) hypertension: Secondary | ICD-10-CM | POA: Insufficient documentation

## 2013-08-27 DIAGNOSIS — S4990XA Unspecified injury of shoulder and upper arm, unspecified arm, initial encounter: Secondary | ICD-10-CM

## 2013-08-27 DIAGNOSIS — Y929 Unspecified place or not applicable: Secondary | ICD-10-CM | POA: Insufficient documentation

## 2013-08-27 DIAGNOSIS — S4980XA Other specified injuries of shoulder and upper arm, unspecified arm, initial encounter: Secondary | ICD-10-CM | POA: Insufficient documentation

## 2013-08-27 DIAGNOSIS — Z791 Long term (current) use of non-steroidal anti-inflammatories (NSAID): Secondary | ICD-10-CM | POA: Insufficient documentation

## 2013-08-27 DIAGNOSIS — F411 Generalized anxiety disorder: Secondary | ICD-10-CM | POA: Insufficient documentation

## 2013-08-27 DIAGNOSIS — X58XXXA Exposure to other specified factors, initial encounter: Secondary | ICD-10-CM | POA: Insufficient documentation

## 2013-08-27 DIAGNOSIS — E079 Disorder of thyroid, unspecified: Secondary | ICD-10-CM | POA: Insufficient documentation

## 2013-08-27 HISTORY — DX: Gastro-esophageal reflux disease without esophagitis: K21.9

## 2013-08-27 HISTORY — DX: Sleep apnea, unspecified: G47.30

## 2013-08-27 HISTORY — DX: Essential (primary) hypertension: I10

## 2013-08-27 HISTORY — DX: Anxiety disorder, unspecified: F41.9

## 2013-08-27 MED ORDER — CYCLOBENZAPRINE HCL 10 MG PO TABS: 10.0000 mg | ORAL_TABLET | Freq: Two times a day (BID) | ORAL | Status: DC | PRN

## 2013-08-27 MED ORDER — TRAMADOL HCL 50 MG PO TABS: 50.0000 mg | ORAL_TABLET | Freq: Four times a day (QID) | ORAL | Status: DC | PRN

## 2013-08-27 MED ORDER — CYCLOBENZAPRINE HCL 10 MG PO TABS
5.0000 mg | ORAL_TABLET | Freq: Once | ORAL | Status: AC
  Administered 2013-08-27: 5 mg via ORAL
  Filled 2013-08-27: qty 1

## 2013-08-27 NOTE — ED Notes (Signed)
Pt reporting right arm pain for 1 week. Denies injury. Strong grip strength, sensation intact. Also reports headache to back of head. Pt is a x 4. In NAD

## 2013-08-27 NOTE — Discharge Instructions (Signed)
Acromioclavicular Injuries  The AC (acromioclavicular) joint is the joint in the shoulder where the collarbone (clavicle) meets the shoulder blade (scapula). The part of the shoulder blade connected to the collarbone is called the acromion. Common problems with and treatments for the AC joint are detailed below.  ARTHRITIS  Arthritis occurs when the joint has been injured and the smooth padding between the joints (cartilage) is lost. This is the wear and tear seen in most joints of the body if they have been overused. This causes the joint to produce pain and swelling which is worse with activity.   AC JOINT SEPARATION  AC joint separation means that the ligaments connecting the acromion of the shoulder blade and collarbone have been damaged, and the two bones no longer line up. AC separations can be anywhere from mild to severe, and are "graded" depending upon which ligaments are torn and how badly they are torn.   Grade I Injury: the least damage is done, and the AC joint still lines up.   Grade II Injury: damage to the ligaments which reinforce the AC joint. In a Grade II injury, these ligaments are stretched but not entirely torn. When stressed, the AC joint becomes painful and unstable.   Grade III Injury: AC and secondary ligaments are completely torn, and the collarbone is no longer attached to the shoulder blade. This results in deformity; a prominence of the end of the clavicle.  AC JOINT FRACTURE  AC joint fracture means that there has been a break in the bones of the AC joint, usually the end of the clavicle.  TREATMENT  TREATMENT OF AC ARTHRITIS   There is currently no way to replace the cartilage damaged by arthritis. The best way to improve the condition is to decrease the activities which aggravate the problem. Application of ice to the joint helps decrease pain and soreness (inflammation). The use of non-steroidal anti-inflammatory medication is helpful.   If less conservative measures do not  work, then cortisone shots (injections) may be used. These are anti-inflammatories; they decrease the soreness in the joint and swelling.   If non-surgical measures fail, surgery may be recommended. The procedure is generally removal of a portion of the end of the clavicle. This is the part of the collarbone closest to your acromion which is stabilized with ligaments to the acromion of the shoulder blade. This surgery may be performed using a tube-like instrument with a light (arthroscope) for looking into a joint. It may also be performed as an open surgery through a small incision by the surgeon. Most patients will have good range of motion within 6 weeks and may return to all activity including sports by 8-12 weeks, barring complications.  TREATMENT OF AN AC SEPARATION   The initial treatment is to decrease pain. This is best accomplished by immobilizing the arm in a sling and placing an ice pack to the shoulder for 20 to 30 minutes every 2 hours as needed. As the pain starts to subside, it is important to begin moving the fingers, wrist, elbow and eventually the shoulder in order to prevent a stiff or "frozen" shoulder. Instruction on when and how much to move the shoulder will be provided by your caregiver. The length of time needed to regain full motion and function depends on the amount or grade of the injury. Recovery from a Grade I AC separation usually takes 10 to 14 days, whereas a Grade III may take 6 to 8 weeks.   Grade   I and II separations usually do not require surgery. Even Grade III injuries usually allow return to full activity with few restrictions. Treatment is also based on the activity demands of the injured shoulder. For example, a high level quarterback with an injured throwing arm will receive more aggressive treatment than someone with a desk job who rarely uses his/her arm for strenuous activities. In some cases, a painful lump may persist which could require a later surgery. Surgery  can be very successful, but the benefits must be weighed against the potential risks.  TREATMENT OF AN AC JOINT FRACTURE  Fracture treatment depends on the type of fracture. Sometimes a splint or sling may be all that is required. Other times surgery may be required for repair. This is more frequently the case when the ligaments supporting the clavicle are completely torn. Your caregiver will help you with these decisions and together you can decide what will be the best treatment.  HOME CARE INSTRUCTIONS    Apply ice to the injury for 15-20 minutes each hour while awake for 2 days. Put the ice in a plastic bag and place a towel between the bag of ice and skin.   If a sling has been applied, wear it constantly for as long as directed by your caregiver, even at night. The sling or splint can be removed for bathing or showering or as directed. Be sure to keep the shoulder in the same place as when the sling is on. Do not lift the arm.   If a figure-of-eight splint has been applied it should be tightened gently by another person every day. Tighten it enough to keep the shoulders held back. Allow enough room to place the index finger between the body and strap. Loosen the splint immediately if there is numbness or tingling in the hands.   Take over-the-counter or prescription medicines for pain, discomfort or fever as directed by your caregiver.   If you or your child has received a follow up appointment, it is very important to keep that appointment in order to avoid long term complications, chronic pain or disability.  SEEK MEDICAL CARE IF:    The pain is not relieved with medications.   There is increased swelling or discoloration that continues to get worse rather than better.   You or your child has been unable to follow up as instructed.   There is progressive numbness and tingling in the arm, forearm or hand.  SEEK IMMEDIATE MEDICAL CARE IF:    The arm is numb, cold or pale.   There is increasing pain  in the hand, forearm or fingers.  MAKE SURE YOU:    Understand these instructions.   Will watch your condition.   Will get help right away if you are not doing well or get worse.  Document Released: 01/27/2005 Document Revised: 07/12/2011 Document Reviewed: 07/22/2008  [employer] Patient Information 2014 [employer].

## 2013-08-27 NOTE — ED Provider Notes (Signed)
CSN: [id number]     Arrival date & time 08/27/13  1633 History  This chart was scribed for non-physician practitioner, Marlon Peliffany Raeshawn Vo, PA-C,working with Ethelda ChickMartha K Linker, MD, by Karle PlumberJennifer Tensley, ED Scribe.  This patient was seen in room TR06C/TR06C and the patient's care was started at 5:57 PM.  Chief Complaint  Patient presents with  . Arm Pain   The history is provided by the patient. No language interpreter was used.   HPI Comments:  Cheryl Castro is a 63 y.o. female who presents to the Emergency Department complaining of worsening upper right arm pain from her elbow to her shoulder that started approximately one week ago. She states the pain sometime radiates from her shoulder to the back of her neck. Pt states lifting the arm makes the pain worse. She states she has been taking Ibuprofen and Tylenol with only mild relief. She denies numbness, weakness, or tingling of the arm. She denies falls, trauma, or injury.  No  pressure or swelling.  Past Medical History  Diagnosis Date  . Thyroid disease   . Hypertension   . GERD (gastroesophageal reflux disease)   . Sleep apnea   . Anxiety    History reviewed. No pertinent past surgical history. No family history on file. History  Substance Use Topics  . Smoking status: Former Games developermoker  . Smokeless tobacco: Not on file  . Alcohol Use: No   OB History   Grav Para Term Preterm Abortions TAB SAB Ect Mult Living                 Review of Systems  Musculoskeletal: Positive for myalgias (RUE).  Neurological: Negative for weakness and numbness.    Allergies  Review of patient's allergies indicates no known allergies.  Home Medications   Prior to Admission medications   Medication Sig Start Date End Date Taking? Authorizing Provider  ARIPiprazole (ABILIFY) 10 MG tablet Take 10 mg by mouth daily.   Yes Historical Provider, MD  atenolol (TENORMIN) 50 MG tablet take 1 tablet by mouth twice a day   Yes Hazeline Junkeryan Grunz, MD  clonazePAM  (KLONOPIN) 1 MG tablet Take 1 mg by mouth daily.   Yes Historical Provider, MD  hydrOXYzine (ATARAX/VISTARIL) 10 MG tablet Take 2 tablets (20 mg total) by mouth at bedtime. 04/13/13  Yes Hazeline Junkeryan Grunz, MD  levothyroxine (SYNTHROID, LEVOTHROID) 200 MCG tablet Take 1 tablet (200 mcg total) by mouth daily. 08/17/12  Yes Durwin RegesJill N Konkol, MD  meloxicam (MOBIC) 15 MG tablet Take 1 tablet (15 mg total) by mouth daily. 07/30/13  Yes Garnetta BuddyEdward V Williamson, MD  PARoxetine (PAXIL) 40 MG tablet take 1 tablet by mouth once daily and 1/2 tablet by mouth EVERY AFTERNOON 05/18/13  Yes Hazeline Junkeryan Grunz, MD  topiramate (TOPAMAX) 50 MG tablet Take 1 tablet (50 mg total) by mouth 2 (two) times daily. 04/13/13  Yes Hazeline Junkeryan Grunz, MD  traZODone (DESYREL) 50 MG tablet Take 0.5-1 tablets (25-50 mg total) by mouth at bedtime as needed for sleep. 04/13/13  Yes Hazeline Junkeryan Grunz, MD  gabapentin (NEURONTIN) 300 MG capsule Take 1 capsule (300 mg total) by mouth 3 (three) times daily. 11/22/11 11/21/12  Andrena MewsMichael D Rigby, DO  hydrochlorothiazide (MICROZIDE) 12.5 MG capsule Take 1 capsule (12.5 mg total) by mouth daily. 08/17/12 08/17/13  Durwin RegesJill N Konkol, MD  ranitidine (ZANTAC) 150 MG capsule Take 1 capsule (150 mg total) by mouth 2 (two) times daily. 08/17/12 08/17/13  Durwin RegesJill N Konkol, MD   Triage Vitals: BP  107/83  Pulse 79  Temp(Src) 98.1 F (36.7 C) (Oral)  Resp 18  Ht 5\' 5"  (1.651 m)  Wt 263 lb 2 oz (119.353 kg)  BMI 43.79 kg/m2  SpO2 95% Physical Exam  Nursing note and vitals reviewed. Constitutional: She is oriented to person, place, and time. She appears well-developed and well-nourished.  HENT:  Head: Normocephalic and atraumatic.  Eyes: EOM are normal.  Neck: Normal range of motion.  Cardiovascular: Normal rate.   Strong radial pulse.  Pulmonary/Chest: Effort normal.  Musculoskeletal: She exhibits tenderness.  Pain with adduction. No pain with supination. Limited active ROM of right shoulder due to pain. Full ROM passively.   Neurological:  She is alert and oriented to person, place, and time.  Good grip strength. Intact sensations.  Skin: Skin is warm and dry.  No ecchymosis or signs of trauma.  Psychiatric: She has a normal mood and affect. Her behavior is normal.    ED Course  Procedures (including critical care time) DIAGNOSTIC STUDIES: Oxygen Saturation is 95% on RA, adequate by my interpretation.   COORDINATION OF CARE: 6:02 PM- Will refer to orthopedist and provide arm sling. Advised pt not to keep arm in sling for more than two weeks- risk of frozen shoulder. Will prescribe pain medication. Pt verbalizes understanding and agrees to plan.  Medications  cyclobenzaprine (FLEXERIL) tablet 5 mg (5 mg Oral Given 08/27/13 1824)    Labs Review Labs Reviewed - No data to display  Imaging Review No results found.   EKG Interpretation None      MDM   Final diagnoses:  Shoulder injury    63 y.o.Holley DexterMarsha C Frary's evaluation in the Emergency Department is complete. It has been determined that no acute conditions requiring further emergency intervention are present at this time. The patient/guardian have been advised of the diagnosis and plan. We have discussed signs and symptoms that warrant return to the ED, such as changes or worsening in symptoms.  Vital signs are stable at discharge. Filed Vitals:   08/27/13 1648  BP: 107/83  Pulse: 79  Temp: 98.1 F (36.7 C)  Resp: 18    Patient/guardian has voiced understanding and agreed to follow-up with the PCP or specialist.   I personally performed the services described in this documentation, which was scribed in my presence. The recorded information has been reviewed and is accurate.    Dorthula Matasiffany G Silveria Botz, PA-C 08/28/13 2109

## 2013-08-27 NOTE — ED Notes (Signed)
Pt. Stated, I've had this pain in my arm that goes to my neck for 2 weeks, and pain in my head once in awhile.

## 2013-08-28 NOTE — ED Provider Notes (Signed)
Medical screening examination/treatment/procedure(s) were performed by non-physician practitioner and as supervising physician I was immediately available for consultation/collaboration.   EKG Interpretation None       Martha K Linker, MD 08/28/13 2119 

## 2013-09-04 ENCOUNTER — Other Ambulatory Visit: Payer: Self-pay | Admitting: Family Medicine

## 2013-09-07 ENCOUNTER — Encounter: Payer: Self-pay | Admitting: Family Medicine

## 2013-09-07 ENCOUNTER — Ambulatory Visit (INDEPENDENT_AMBULATORY_CARE_PROVIDER_SITE_OTHER): Payer: No Typology Code available for payment source | Admitting: Family Medicine

## 2013-09-07 VITALS — BP 154/77 | HR 70 | Temp 97.6°F | Ht 65.0 in | Wt 262.0 lb

## 2013-09-07 DIAGNOSIS — S46819A Strain of other muscles, fascia and tendons at shoulder and upper arm level, unspecified arm, initial encounter: Secondary | ICD-10-CM

## 2013-09-07 DIAGNOSIS — E669 Obesity, unspecified: Secondary | ICD-10-CM

## 2013-09-07 DIAGNOSIS — S43499A Other sprain of unspecified shoulder joint, initial encounter: Secondary | ICD-10-CM

## 2013-09-07 DIAGNOSIS — F411 Generalized anxiety disorder: Secondary | ICD-10-CM

## 2013-09-07 DIAGNOSIS — M67929 Unspecified disorder of synovium and tendon, unspecified upper arm: Secondary | ICD-10-CM

## 2013-09-07 DIAGNOSIS — L299 Pruritus, unspecified: Secondary | ICD-10-CM

## 2013-09-07 DIAGNOSIS — S46219A Strain of muscle, fascia and tendon of other parts of biceps, unspecified arm, initial encounter: Secondary | ICD-10-CM

## 2013-09-07 DIAGNOSIS — Z1239 Encounter for other screening for malignant neoplasm of breast: Secondary | ICD-10-CM

## 2013-09-07 DIAGNOSIS — K2960 Other gastritis without bleeding: Secondary | ICD-10-CM

## 2013-09-07 DIAGNOSIS — M719 Bursopathy, unspecified: Secondary | ICD-10-CM

## 2013-09-07 DIAGNOSIS — I10 Essential (primary) hypertension: Secondary | ICD-10-CM

## 2013-09-07 DIAGNOSIS — M6790 Unspecified disorder of synovium and tendon, unspecified site: Secondary | ICD-10-CM

## 2013-09-07 DIAGNOSIS — Z1211 Encounter for screening for malignant neoplasm of colon: Secondary | ICD-10-CM

## 2013-09-07 MED ORDER — TOPIRAMATE 50 MG PO TABS: 50.0000 mg | ORAL_TABLET | Freq: Two times a day (BID) | ORAL | Status: DC

## 2013-09-07 MED ORDER — RANITIDINE HCL 150 MG PO CAPS: 150.0000 mg | ORAL_CAPSULE | Freq: Every evening | ORAL | Status: DC

## 2013-09-07 MED ORDER — HYDROXYZINE HCL 10 MG PO TABS: 20.0000 mg | ORAL_TABLET | Freq: Every evening | ORAL | Status: DC | PRN

## 2013-09-07 MED ORDER — HYDROCHLOROTHIAZIDE 12.5 MG PO CAPS: 12.5000 mg | ORAL_CAPSULE | Freq: Every day | ORAL | Status: DC

## 2013-09-07 NOTE — Assessment & Plan Note (Signed)
Suspect biceps tendon tear without rupture. Refer to sports med for U/S and management. Has enough pain medication and muscle relaxer for the next week or two. Recommended using sling with exercises as tolerated QID to prevent frozen shoulder.

## 2013-09-07 NOTE — Progress Notes (Signed)
Patient ID: Cheryl Castro Duffey, female   DOB: 02-21-1951, 63 y.o.   MRN: [id number]   Subjective:  HPI:   Cheryl Castro Garver is a 63 y.o. female here for right arm pain and to follow up.   She was seen and diagnosed with L hip bursitis and had XR done and wants to know the results.   She reports R shoulder/arm pain when bracing herself getting up in the bath sometime in April. Worse since that time. It is located in the front overlying the biceps muscle superiorly but no distinct place. Radiates a little inferiorly but not past elbow. No trauma. It has severely limited her use of that (her dominant) arm. In the ED she was evaluated and given tramadol which takes the edge off. She was told to wear a sling, but this is limited by the sling causing left shoulder pain. Ibuprofen helps "a little," but she has found most relief with tramadol and a muscle relaxer. She hasn't taken mobic.    Review of Systems:  Per HPI. All other systems reviewed and are negative.  Medications: reviewed and updated  Objective:  Physical Exam: BP 154/77  Pulse 70  Temp(Src) 97.6 F (36.4 Castro) (Oral)  Ht 5\' 5"  (1.651 m)  Wt 262 lb (118.842 kg)  BMI 43.60 kg/m2  Gen:  [age] y.o. female in NAD Neuro: Alert and oriented, speech normal Left Hip: No pain on palpation, able to flex without pain.  Right Shoulder: Inspection reveals no abnormalities, atrophy or asymmetry. Palpation elicits tenderness on upper biceps and anterior shoulder.  Active and passive ROM limited to 90 degrees on shoulder flexion and abduction. Elbow ROM full.  Neurovascularly intact distally.  No biceps bulge.   Assessment:     Cheryl Castro Walrath is a 63 y.o. female here for right arm pain.     Plan:     See problem list for problem-specific plans.

## 2013-09-07 NOTE — Patient Instructions (Signed)
Thank you for coming in today!  I have made a referral to the sports medicine clinic.  I have refilled your medications and you need to get paxil and abilify and klonopin.  You need to call to schedule your mammogram Return the fecal occult bloods cards and I will call you with the results of that.   As you leave, make an appointment to follow up with me in 6 months.   Take care and seek immediate care sooner if you develop any concerns.  Please feel free to call with any questions or concerns at any time, at [phone]. - Dr. Jarvis NewcomerGrunz

## 2013-09-12 ENCOUNTER — Ambulatory Visit (INDEPENDENT_AMBULATORY_CARE_PROVIDER_SITE_OTHER): Payer: No Typology Code available for payment source | Admitting: Emergency Medicine

## 2013-09-12 ENCOUNTER — Encounter: Payer: Self-pay | Admitting: Emergency Medicine

## 2013-09-12 VITALS — BP 122/87 | Ht 65.0 in | Wt 260.0 lb

## 2013-09-12 DIAGNOSIS — M6790 Unspecified disorder of synovium and tendon, unspecified site: Secondary | ICD-10-CM

## 2013-09-12 DIAGNOSIS — M7501 Adhesive capsulitis of right shoulder: Secondary | ICD-10-CM

## 2013-09-12 DIAGNOSIS — M719 Bursopathy, unspecified: Secondary | ICD-10-CM

## 2013-09-12 DIAGNOSIS — M7500 Adhesive capsulitis of unspecified shoulder: Secondary | ICD-10-CM

## 2013-09-12 DIAGNOSIS — M67929 Unspecified disorder of synovium and tendon, unspecified upper arm: Secondary | ICD-10-CM

## 2013-09-12 MED ORDER — METHYLPREDNISOLONE ACETATE 40 MG/ML IJ SUSP
40.0000 mg | Freq: Once | INTRAMUSCULAR | Status: AC
  Administered 2013-09-12: 40 mg via INTRA_ARTICULAR

## 2013-09-12 MED ORDER — MELOXICAM 15 MG PO TABS: 15.0000 mg | ORAL_TABLET | Freq: Every day | ORAL | Status: DC

## 2013-09-12 NOTE — Progress Notes (Signed)
Patient ID: Cheryl Castro, female   DOB: 06/18/1950, 63 y.o.   MRN: [id number] 63 year old female slipped approximately 2 weeks on a background a handle of the rack and felt acute onset of pain in the anterior portion of her shoulder and upper arm. Denies any significant swelling although she continues to have pain into the upper arm. Weakness with lifting heavy objects. She is also noted progressive weakness and decreased range of motion in her shoulder. She was seen in the emergency room after the visit and presents today in followup. She is difficulty reaching overhead at this time. She states that is progressively worsened.   Pertinent past medical history: Obesity, hypertension  Social history: For smoker, no alcohol  Family history: Significant for hypertension  Review of systems as per history of present illness otherwise all systems negative  Examination: BP 122/87  Ht 5\' 5"  (1.651 m)  Wt 260 lb (117.935 kg)  BMI 43.27 kg/m132 OB 63 year old female awake alert oriented no acute distress Right Shoulder: Inspection reveals no abnormalities, atrophy or asymmetry. Tenderness to palpation over the biceps tendon ROM limited abduction and forward flexion and external rotation Rotator cuff strength decreased 4/5 Positive empty can. Speeds and Yergason's tests positive Positive painful arc and  drop arm sign.  Musculoskeletal ultrasound of the right shoulder was performed.  Images were consistent with a partial biceps tendon rupture with a significant amount of fluid within the tendon sheath. Images of the rotator cuff itself were somewhat limited secondary to body habitus.  Procedure:  Injection of right subacromial space Consent obtained and verified. Time-out conducted. Noted no overlying erythema, induration, or other signs of local infection. Skin prepped in a sterile fashion. Topical analgesic spray: Ethyl chloride. Completed without difficulty. Meds: 1:3 depo/lido Pain  immediately improved suggesting accurate placement of the medication. Advised to call if fevers/chills, erythema, induration, drainage, or persistent bleeding.

## 2013-09-12 NOTE — Assessment & Plan Note (Signed)
Her exam today is consistent with a partial biceps tendon rupture. We've given her activity modification to prevent further damage or injury. In addition as I cannot completely rule out rotator cuff pathology as well, she was given a corticosteroid injection into the subacromial space. She was also given rotator cuff wall walking exercises and pendulum swing exercises. She should start these in followup in 3-4 weeks. I expressed significant concern for her that there is the potential for frozen shoulder/adhesive capsulitis given her current presentation and the range of motion of the shoulder joint is important.

## 2013-09-15 ENCOUNTER — Other Ambulatory Visit: Payer: Self-pay | Admitting: Family Medicine

## 2013-10-03 ENCOUNTER — Ambulatory Visit: Payer: No Typology Code available for payment source | Admitting: Emergency Medicine

## 2013-11-09 ENCOUNTER — Encounter: Payer: Self-pay | Admitting: Family Medicine

## 2013-11-09 ENCOUNTER — Ambulatory Visit (INDEPENDENT_AMBULATORY_CARE_PROVIDER_SITE_OTHER): Payer: No Typology Code available for payment source | Admitting: Family Medicine

## 2013-11-09 VITALS — BP 147/83 | HR 78 | Temp 98.1°F | Ht 65.0 in | Wt 272.2 lb

## 2013-11-09 DIAGNOSIS — M67921 Unspecified disorder of synovium and tendon, right upper arm: Secondary | ICD-10-CM

## 2013-11-09 DIAGNOSIS — E669 Obesity, unspecified: Secondary | ICD-10-CM

## 2013-11-09 DIAGNOSIS — M719 Bursopathy, unspecified: Secondary | ICD-10-CM

## 2013-11-09 DIAGNOSIS — M6790 Unspecified disorder of synovium and tendon, unspecified site: Secondary | ICD-10-CM

## 2013-11-09 MED ORDER — MELOXICAM 15 MG PO TABS: 15.0000 mg | ORAL_TABLET | Freq: Every day | ORAL | Status: DC

## 2013-11-09 NOTE — Assessment & Plan Note (Addendum)
Refilled Meloxicam 15mg .

## 2013-11-09 NOTE — Progress Notes (Signed)
Great note and great job at redirecting her efforts. :)

## 2013-11-09 NOTE — Progress Notes (Signed)
Subjective:     Patient ID: Marylen PontoMarsha C Winkel, female   DOB: 29-Aug-1950, 63 y.o.   MRN: [id number]  HPI Mrs. Andrey CampanileWilson is a 63yo female with a history of hypertension, hypothyroidism, obesity, and bipolar II presenting today hoping to discuss weight loss.  She originally scheduled the appointment for chief complaint of rash and allergies, but she states this has resolved since making the appointment.   She has taken Adipex 1/2 tablet/day a few years ago and she really liked that it curbed her appetite and helped her lose weight.  Since stopping it, she has gained more weight and now weighs more than when she originally began taking Adipex.  She is interested in discussing beginning another trial of Adipex to "give her a jump start" on her weight loss goals.  She does not exercise due to feeling tired and knee pain, but stated today that she is going to begin this week by walking with family.  She has a poor diet and would like to meet with a nutritionist to improve her eating habits.    No other acute comlaints today.  Review of Systems  Constitutional: Positive for fatigue.  Musculoskeletal:       Right Shoulder Pain- improving  All other systems reviewed and are negative.  Chief Complaint noted Review of Symptoms - see HPI PMH - Smoking status noted.   Vital Signs reviewed     Objective:   Physical Exam  Constitutional: She appears well-developed and well-nourished.  HENT:  Head: Normocephalic and atraumatic.  Cardiovascular: Normal rate, regular rhythm and normal heart sounds.  Exam reveals no gallop and no friction rub.   No murmur heard. Pulmonary/Chest: Effort normal and breath sounds normal. No respiratory distress. She has no wheezes. She has no rales.       Assessment:     Please refer to Problem List for Assessment.    Plan:     Please refer to Problem List for Plan.

## 2013-11-09 NOTE — Patient Instructions (Signed)
Thank you so much for coming to see us today!  It sounds like you are really motivated to make some changes in your life and I am so excited for you!  Remember to make moving your body fun and do what you enjoy doing!  If you don't feel like moving one day, remember the 5 minute rule:  Start moving and if you still don't feel like continuing then you can stop.  It might also be easier to separate your exercise sessions up into smaller segments, moving your body for a few minutes several times throughout the day!  I am also putting in a referral to Wyona AlmasJeannie Sykes, our nutritionist here in the clinic, for you to discuss your diet!  I am glad your shoulder is starting to feel better and I've put in a refill for Meloxicam to help with the remaining pain!  It was a pleasure to see you today and you were such an inspirational patient!  Dr. Caroleen Hammanumley

## 2013-11-09 NOTE — Assessment & Plan Note (Addendum)
Discussed cardiovascular risks of taking Adipex and that it is not medically advised.  Discussed her exercise plan and suggested that she does whatever exercise is fun for her. Told her about the 5 minute plan (exercise for 5 minutes and if she still doesn't want to exercise that day then she can stop) and breaking up her exercise regimen into smaller intervals of a few minutes multiple times a day.  Given business card of Dr. Gerilyn PilgrimSykes so she can contact her and set up an appointment for nutrition counseling.

## 2013-11-29 ENCOUNTER — Telehealth: Payer: Self-pay | Admitting: Family Medicine

## 2013-11-29 NOTE — Telephone Encounter (Signed)
Refill request for PARoxetine. Please send ASAP. Patient completely out.

## 2013-11-30 MED ORDER — PAROXETINE HCL 40 MG PO TABS: ORAL_TABLET | ORAL | Status: DC

## 2013-11-30 NOTE — Telephone Encounter (Signed)
Refilled. Thank you.

## 2014-03-06 ENCOUNTER — Ambulatory Visit: Payer: Self-pay

## 2014-03-14 ENCOUNTER — Ambulatory Visit: Payer: Self-pay

## 2014-04-08 ENCOUNTER — Ambulatory Visit (INDEPENDENT_AMBULATORY_CARE_PROVIDER_SITE_OTHER): Payer: Self-pay | Admitting: Family Medicine

## 2014-04-08 ENCOUNTER — Encounter: Payer: Self-pay | Admitting: Family Medicine

## 2014-04-08 VITALS — BP 148/73 | HR 67 | Temp 97.6°F | Ht 65.0 in | Wt 264.4 lb

## 2014-04-08 DIAGNOSIS — I10 Essential (primary) hypertension: Secondary | ICD-10-CM

## 2014-04-08 DIAGNOSIS — S39012A Strain of muscle, fascia and tendon of lower back, initial encounter: Secondary | ICD-10-CM | POA: Insufficient documentation

## 2014-04-08 DIAGNOSIS — Z1211 Encounter for screening for malignant neoplasm of colon: Secondary | ICD-10-CM

## 2014-04-08 DIAGNOSIS — F411 Generalized anxiety disorder: Secondary | ICD-10-CM

## 2014-04-08 DIAGNOSIS — L299 Pruritus, unspecified: Secondary | ICD-10-CM

## 2014-04-08 DIAGNOSIS — Z0000 Encounter for general adult medical examination without abnormal findings: Secondary | ICD-10-CM

## 2014-04-08 DIAGNOSIS — M67921 Unspecified disorder of synovium and tendon, right upper arm: Secondary | ICD-10-CM

## 2014-04-08 MED ORDER — HYDROXYZINE HCL 10 MG PO TABS: 20.0000 mg | ORAL_TABLET | Freq: Every evening | ORAL | Status: DC | PRN

## 2014-04-08 MED ORDER — HYDROCHLOROTHIAZIDE 25 MG PO TABS: 25.0000 mg | ORAL_TABLET | Freq: Every day | ORAL | Status: DC

## 2014-04-08 MED ORDER — CYCLOBENZAPRINE HCL 10 MG PO TABS
10.0000 mg | ORAL_TABLET | Freq: Two times a day (BID) | ORAL | Status: DC | PRN
Start: 2014-04-08 — End: 2015-01-11

## 2014-04-08 NOTE — Patient Instructions (Addendum)
For itching, I refilled hydroxyzine. Consider keeping an itching journal.  For back pain take flexeril as directed and rest as much as possible Don't forget right shoulder exercises.  You will need to call for your mammogram.  You will be contacted about a colonoscopy.  Start taking hydrochlorothiazide 25mg  every day.   On your way out make an appointment to see me for your pap smear.

## 2014-04-08 NOTE — Assessment & Plan Note (Signed)
Referred for mammogram again today.  Declines flu shot again today. Will perform pap smear on follow up visit.  Referred for colonoscopy, as she did not return hemoccult cards.

## 2014-04-08 NOTE — Assessment & Plan Note (Signed)
Without surgical indication. Reviewed exercises and had pt perform them today, used teach back to confirm her understanding of adhesive capsulitis. Will perform these daily. Has NSAID for pain relief.

## 2014-04-08 NOTE — Assessment & Plan Note (Signed)
Short course of muscle relaxer prn, stretches, apply heat to area.

## 2014-04-08 NOTE — Assessment & Plan Note (Signed)
Elevated on past 2 office visits. Will Increase HCTZ to 25mg  and get labs at follow up.

## 2014-04-08 NOTE — Progress Notes (Signed)
Patient ID: Marylen PontoMarsha C Bonfanti, female   DOB: [birth year], 63 y.o.   MRN: [id number]   Subjective:  Marylen PontoMarsha C Kimmey is a 63 y.o. female presenting for follow up of pain and itching.   Back pain for the past 3 months intermittent, worsened by standing, better by sitting. Aching heaviness, moderate pain that radiates somewhat to the left side. Ibuprofen doesn't help. Tylenol didn't help. No dysuria, frequency, urgency, nocturia. No vaginal discharge or bleeding. Knees are aching but denies other arthralgias or myalgias.  Pt denies any current bowel/bladder problems, fever, chills, unintentional weight loss, night time awakenings secondary to pain, weakness in one or both legs  Her right shoulder still hurts from time to time and limits use of her dominant hand, denies radicular symptoms or weakness, just pain on the lateral shoulder. Sports medicine recommended exercises only which she has not been doing.   She has chronic itching worse in the summer without any trigger she can think of. Hydroxyzine has worked in the past for this.   - Review of Systems: Per HPI.  - Smoking status noted Objective:  BP 148/73 mmHg  Pulse 67  Temp(Src) 97.6 F (36.4 C) (Oral)  Ht 5\' 5"  (1.651 m)  Wt 264 lb 6.4 oz (119.931 kg)  BMI 44.00 kg/m2 04/08/14 148/73  11/09/13 147/83   Gen: Well-appearing, obese 63 y.o.female in NAD Skin: No wounds. Scattered very superficial excoriations across upper back without underlying rash.  MSK: No pain to palpation along lower back in midline. + Left-sided paraspinal spasm. No CVA tenderness. No tenderness of trochanteric prominence. No limitation of ROM. Normal gait and station Neuro: CN II-XII without deficits, sensation intact to light touch bilateral LEs including saddle area. Assessment/Plan:  Marylen PontoMarsha C Lefferts is a 63 y.o. female here for back pain.  Problem List Items Addressed This Visit    None

## 2014-04-08 NOTE — Assessment & Plan Note (Signed)
Review therapeutic lifestyle changes and discussed adding activity without formal exercise into everyday life. Pt declines referral to nutritionist.

## 2014-04-08 NOTE — Assessment & Plan Note (Signed)
Refilled hydroxyzine for qHS use. No organic cause identified.

## 2014-07-17 ENCOUNTER — Encounter: Payer: Self-pay | Admitting: Family Medicine

## 2014-07-17 ENCOUNTER — Ambulatory Visit (INDEPENDENT_AMBULATORY_CARE_PROVIDER_SITE_OTHER): Payer: Self-pay | Admitting: Family Medicine

## 2014-07-17 VITALS — BP 120/68 | HR 80 | Temp 97.9°F | Ht 65.0 in | Wt 255.2 lb

## 2014-07-17 DIAGNOSIS — I10 Essential (primary) hypertension: Secondary | ICD-10-CM

## 2014-07-17 DIAGNOSIS — E669 Obesity, unspecified: Secondary | ICD-10-CM

## 2014-07-17 DIAGNOSIS — Z0000 Encounter for general adult medical examination without abnormal findings: Secondary | ICD-10-CM

## 2014-07-17 DIAGNOSIS — K2960 Other gastritis without bleeding: Secondary | ICD-10-CM

## 2014-07-17 DIAGNOSIS — R1013 Epigastric pain: Secondary | ICD-10-CM

## 2014-07-17 DIAGNOSIS — E039 Hypothyroidism, unspecified: Secondary | ICD-10-CM

## 2014-07-17 DIAGNOSIS — Z1239 Encounter for other screening for malignant neoplasm of breast: Secondary | ICD-10-CM

## 2014-07-17 LAB — COMPREHENSIVE METABOLIC PANEL
ALBUMIN: 4.1 g/dL (ref 3.5–5.2)
ALK PHOS: 93 U/L (ref 39–117)
ALT: 20 U/L (ref 0–35)
AST: 23 U/L (ref 0–37)
BUN: 14 mg/dL (ref 6–23)
CO2: 26 mEq/L (ref 19–32)
Calcium: 9.5 mg/dL (ref 8.4–10.5)
Chloride: 103 mEq/L (ref 96–112)
Creat: 0.69 mg/dL (ref 0.50–1.10)
GLUCOSE: 98 mg/dL (ref 70–99)
Potassium: 4.2 mEq/L (ref 3.5–5.3)
Sodium: 138 mEq/L (ref 135–145)
Total Bilirubin: 0.3 mg/dL (ref 0.2–1.2)
Total Protein: 7.1 g/dL (ref 6.0–8.3)

## 2014-07-17 LAB — TSH: TSH: 0.054 u[IU]/mL — AB (ref 0.350–4.500)

## 2014-07-17 LAB — LIPASE: Lipase: 16 U/L (ref 0–75)

## 2014-07-17 LAB — AMYLASE: Amylase: 40 U/L (ref 0–105)

## 2014-07-17 MED ORDER — RANITIDINE HCL 150 MG PO CAPS: 150.0000 mg | ORAL_CAPSULE | Freq: Every evening | ORAL | Status: DC

## 2014-07-17 MED ORDER — PAROXETINE HCL 40 MG PO TABS: ORAL_TABLET | ORAL | Status: DC

## 2014-07-17 MED ORDER — LEVOTHYROXINE SODIUM 200 MCG PO TABS: 200.0000 ug | ORAL_TABLET | Freq: Every day | ORAL | Status: DC

## 2014-07-17 NOTE — Assessment & Plan Note (Signed)
Well controlled today. Will continue current tx.

## 2014-07-17 NOTE — Assessment & Plan Note (Signed)
With history of hiatal hernia. Will restart zantac to hopefully improve these symptoms which don't frankly fit well into any single organic diagnostic category. Also check CMP, amylase, lipase (given epigastric discomfort).

## 2014-07-17 NOTE — Assessment & Plan Note (Signed)
BMI 42 today. Discussed lifestyle interventions briefly. Pt aware of association between obesity and GERD as well as dyspnea (OHS, OSA, pickwickian, deconditioning).

## 2014-07-17 NOTE — Progress Notes (Signed)
Subjective: Cheryl Castro is a 64 y.o. female patient of mine presenting for lower chest and LUQ abdominal pain  "Sore" pain in the chest, left side and left back (not radiating, just present at these places) which started several weeks ago and worsened over the past few days. It is vague, waxing and waning from severity of 3 to 8 out of 10. Worse with movement. Unchanged by exertion. Sometimes worse after meals. Sometimes associated with dyspnea, sometimes dyspnea is unrelated. Tried ibuprofen, OTC indigestion medications without improvement. Has not been taking zantac.  No N/V/D/C, no pleuritic chest pain or abd pain. No leg swelling or orthopnea. No personal or family history of blood clots. No changes in medications or additions to diet. No new stressors.   - ROS: Per HPI - Non smoker, she has been trying to eat smaller portions  Objective: BP 120/68 mmHg  Pulse 80  Temp(Src) 97.9 F (36.6 C) (Oral)  Ht 5\' 5"  (1.651 m)  Wt 255 lb 3.2 oz (115.758 kg)  BMI 42.47 kg/m2 Gen: Obese  64 y.o. female in no distress CV: Distant S1 S2, regular rate, no murmur; cap refill < 2 sec; no LE edema, no JVD. Pulm: Non-labored breathing ambient air; CTAB, no wheezes or crackles GI: Normoactive BS; soft, mildly tender only to deep palpation on epigastrium and LUQ, non-distended, no organomegaly, no hernia appreciated  Assessment/Plan: Cheryl Castro is a 64 y.o. female here for symptoms attributable to gastritis. DDx includes angina, though no significant history of ischemia, nor would it even be anginal (not substernal, exertional, nor relieved by rest).   See problem list for plan.

## 2014-07-17 NOTE — Assessment & Plan Note (Signed)
reports compliance, will recheck TSH Cheryl Castro(qYearly). No attributable symptoms at this time.

## 2014-07-17 NOTE — Patient Instructions (Signed)
Thank you for coming in today!  I think your symptoms are due to your hernia causing an inflammation of your stomach lining. The treatment prescribed for you is zantac, please start this back. This has been sent to your pharmacy.  We are checking some labs today, and we'll call you if they are abnormal.  - Make sure you schedule your mammogram.   Our clinic's number is [phone]. Feel free to call any time with questions or concerns. We will answer any questions after hours with our 24-hour emergency line at that number as well.   - Dr. Jarvis NewcomerGrunz

## 2014-07-18 LAB — HIV ANTIBODY (ROUTINE TESTING W REFLEX): HIV 1&2 Ab, 4th Generation: NONREACTIVE

## 2014-09-05 ENCOUNTER — Other Ambulatory Visit: Payer: Self-pay | Admitting: *Deleted

## 2014-09-05 MED ORDER — ATENOLOL 50 MG PO TABS: 50.0000 mg | ORAL_TABLET | Freq: Two times a day (BID) | ORAL | Status: DC

## 2014-10-05 ENCOUNTER — Emergency Department (HOSPITAL_COMMUNITY): Payer: Self-pay

## 2014-10-05 ENCOUNTER — Encounter (HOSPITAL_COMMUNITY): Payer: Self-pay

## 2014-10-05 ENCOUNTER — Emergency Department (HOSPITAL_COMMUNITY)
Admission: EM | Admit: 2014-10-05 | Discharge: 2014-10-06 | Disposition: A | Discharge status: Home / Self Care | Payer: Self-pay | Attending: Emergency Medicine | Admitting: Emergency Medicine

## 2014-10-05 DIAGNOSIS — R079 Chest pain, unspecified: Secondary | ICD-10-CM

## 2014-10-05 DIAGNOSIS — R002 Palpitations: Secondary | ICD-10-CM | POA: Insufficient documentation

## 2014-10-05 DIAGNOSIS — R42 Dizziness and giddiness: Secondary | ICD-10-CM | POA: Insufficient documentation

## 2014-10-05 DIAGNOSIS — Z8669 Personal history of other diseases of the nervous system and sense organs: Secondary | ICD-10-CM | POA: Insufficient documentation

## 2014-10-05 DIAGNOSIS — R110 Nausea: Secondary | ICD-10-CM | POA: Insufficient documentation

## 2014-10-05 DIAGNOSIS — E079 Disorder of thyroid, unspecified: Secondary | ICD-10-CM | POA: Insufficient documentation

## 2014-10-05 DIAGNOSIS — Z791 Long term (current) use of non-steroidal anti-inflammatories (NSAID): Secondary | ICD-10-CM | POA: Insufficient documentation

## 2014-10-05 DIAGNOSIS — K219 Gastro-esophageal reflux disease without esophagitis: Secondary | ICD-10-CM | POA: Insufficient documentation

## 2014-10-05 DIAGNOSIS — I10 Essential (primary) hypertension: Secondary | ICD-10-CM | POA: Insufficient documentation

## 2014-10-05 DIAGNOSIS — Z79899 Other long term (current) drug therapy: Secondary | ICD-10-CM | POA: Insufficient documentation

## 2014-10-05 DIAGNOSIS — R0789 Other chest pain: Secondary | ICD-10-CM | POA: Insufficient documentation

## 2014-10-05 LAB — BASIC METABOLIC PANEL
Anion gap: 9 (ref 5–15)
BUN: 9 mg/dL (ref 6–20)
CO2: 28 mmol/L (ref 22–32)
CREATININE: 0.69 mg/dL (ref 0.44–1.00)
Calcium: 9.2 mg/dL (ref 8.9–10.3)
Chloride: 98 mmol/L — ABNORMAL LOW (ref 101–111)
Glucose, Bld: 82 mg/dL (ref 65–99)
Potassium: 3.6 mmol/L (ref 3.5–5.1)
Sodium: 135 mmol/L (ref 135–145)

## 2014-10-05 LAB — CBC
HEMATOCRIT: 41.8 % (ref 36.0–46.0)
HEMOGLOBIN: 13.8 g/dL (ref 12.0–15.0)
MCH: 28.5 pg (ref 26.0–34.0)
MCHC: 33 g/dL (ref 30.0–36.0)
MCV: 86.2 fL (ref 78.0–100.0)
Platelets: 362 10*3/uL (ref 150–400)
RBC: 4.85 MIL/uL (ref 3.87–5.11)
RDW: 13.6 % (ref 11.5–15.5)
WBC: 6.3 10*3/uL (ref 4.0–10.5)

## 2014-10-05 LAB — I-STAT TROPONIN, ED
TROPONIN I, POC: 0 ng/mL (ref 0.00–0.08)
TROPONIN I, POC: 0 ng/mL (ref 0.00–0.08)

## 2014-10-05 LAB — BRAIN NATRIURETIC PEPTIDE: B NATRIURETIC PEPTIDE 5: 72.6 pg/mL (ref 0.0–100.0)

## 2014-10-05 NOTE — ED Provider Notes (Signed)
CSN: [id number]     Arrival date & time 10/05/14  1948 History   First MD Initiated Contact with Patient 10/05/14 2240     This chart was scribed for Zadie Rhine, MD by Arlan Organ, ED Scribe. This patient was seen in room B17C/B17C and the patient's care was started 11:09 PM.   Chief Complaint  Patient presents with  . Chest Pain   Patient is a 64 y.o. female presenting with chest pain. The history is provided by the patient. No language interpreter was used.  Chest Pain Pain location:  Unable to specify Pain quality: aching and tightness   Pain radiates to:  Does not radiate Pain radiates to the back: no   Pain severity:  Mild Duration:  2 weeks Timing:  Intermittent Progression:  Unchanged Chronicity:  New Associated symptoms: dizziness, nausea and palpitations   Associated symptoms: no abdominal pain, no cough, no diaphoresis, no fever, no shortness of breath and not vomiting     HPI Comments: Cheryl Castro is a 64 y.o. female with a PMHx of thyroid disease and HTN who presents to the Emergency Department complaining of intermittent, ongoing, unchanged, mild chest pain x 2 weeks. Pain is described as tightness. She also reports mild dizziness, nausea, and palpitations. No OTC medications or home remedies attempted prior to arrival. No fever, chills, diaphoresis, abdominal pain, or leg swelling. No syncope. No history of MI but admits to a family history of heart issues-Father. She denies any history of blood clots. Cheryl Castro takes Synthroid daily for thyroid disease. No known allergies to medications.   Past Medical History  Diagnosis Date  . Thyroid disease   . Hypertension   . GERD (gastroesophageal reflux disease)   . Sleep apnea   . Anxiety    History reviewed. No pertinent past surgical history. History reviewed. No pertinent family history. History  Substance Use Topics  . Smoking status: Former Games developer  . Smokeless tobacco: Not on file  . Alcohol Use: No    OB History    No data available     Review of Systems  Constitutional: Negative for fever, chills and diaphoresis.  Respiratory: Negative for cough and shortness of breath.   Cardiovascular: Positive for chest pain and palpitations.  Gastrointestinal: Positive for nausea. Negative for vomiting, abdominal pain and diarrhea.  Musculoskeletal: Negative for arthralgias.  Skin: Negative for rash.  Neurological: Positive for dizziness.  Psychiatric/Behavioral: Negative for confusion.  All other systems reviewed and are negative.   Allergies  Review of patient's allergies indicates no known allergies.  Home Medications   Prior to Admission medications   Medication Sig Start Date End Date Taking? Authorizing Provider  ARIPiprazole (ABILIFY) 10 MG tablet Take 10 mg by mouth daily.    Historical Provider, MD  atenolol (TENORMIN) 50 MG tablet Take 1 tablet (50 mg total) by mouth 2 (two) times daily. 09/05/14   Tyrone Nine, MD  clonazePAM (KLONOPIN) 1 MG tablet Take 1 mg by mouth daily.    Historical Provider, MD  cyclobenzaprine (FLEXERIL) 10 MG tablet Take 1 tablet (10 mg total) by mouth 2 (two) times daily as needed for muscle spasms. 04/08/14   Tyrone Nine, MD  hydrochlorothiazide (HYDRODIURIL) 25 MG tablet Take 1 tablet (25 mg total) by mouth daily. 04/08/14   Tyrone Nine, MD  hydrOXYzine (ATARAX/VISTARIL) 10 MG tablet Take 2 tablets (20 mg total) by mouth at bedtime as needed. 04/08/14   Tyrone Nine, MD  levothyroxine (SYNTHROID,  LEVOTHROID) 200 MCG tablet Take 1 tablet (200 mcg total) by mouth daily. 07/17/14   Tyrone Nine, MD  meloxicam (MOBIC) 15 MG tablet Take 1 tablet (15 mg total) by mouth daily. 11/09/13   [place] N Rumley, DO  PARoxetine (PAXIL) 40 MG tablet take 1 tablet by mouth once daily and 1/2 tablet by mouth EVERY AFTERNOON 07/17/14   Tyrone Nine, MD  ranitidine (ZANTAC) 150 MG capsule Take 1 capsule (150 mg total) by mouth every evening. 07/17/14 07/17/15  Tyrone Nine, MD   topiramate (TOPAMAX) 50 MG tablet Take 1 tablet (50 mg total) by mouth 2 (two) times daily. 09/07/13   Tyrone Nine, MD  traMADol (ULTRAM) 50 MG tablet Take 1 tablet (50 mg total) by mouth every 6 (six) hours as needed. 08/27/13   Marlon Pel, PA-C   Triage Vitals: BP 116/69 mmHg  Pulse 61  Temp(Src) 98.4 F (36.9 C) (Oral)  Resp 21  Ht  (1.651 m)  Wt 250 lb 4.8 oz (113.535 kg)  BMI 41.65 kg/m2  SpO2 98%   Physical Exam  CONSTITUTIONAL: Well developed/well nourished HEAD: Normocephalic/atraumatic EYES: EOMI/PERRL ENMT: Mucous membranes moist NECK: supple no meningeal signs SPINE/BACK:entire spine nontender CV: S1/S2 noted, no murmurs/rubs/gallops noted LUNGS: Lungs are clear to auscultation bilaterally, no apparent distress ABDOMEN: soft, nontender, no rebound or guarding, bowel sounds noted throughout abdomen GU:no cva tenderness NEURO: Pt is awake/alert/appropriate, moves all extremitiesx4.  No facial droop.   EXTREMITIES: pulses normal/equal, full ROM, no calf tenderness or edema SKIN: warm, color normal PSYCH: no abnormalities of mood noted, alert and oriented to situation   ED Course  Procedures   DIAGNOSTIC STUDIES: Oxygen Saturation is 96% on RA, adequate by my interpretation.    Pt with very mild chest tightness but her main issue appears to be palpitations without syncope or tachycardia. While in room she felt palpitations but monitor revealed NSR without brady/tachycardia.  She is well appearing.  HEART score 3.  I have low suspicion for ACS/PE/Dissection.  Will d/c home with PCP evaluation.  She may benefit from holter monitoring.  Will need recheck of TSH by PCP   Labs Review Labs Reviewed  BASIC METABOLIC PANEL - Abnormal; Notable for the following:    Chloride 98 (*)    All other components within normal limits  CBC  BRAIN NATRIURETIC PEPTIDE  I-STAT TROPOININ, ED    Imaging Review Dg Chest 2 View  10/05/2014   CLINICAL DATA:  Chest pain.   Tightness on the left for 2 weeks.  EXAM: CHEST  2 VIEW  COMPARISON:  06/14/2009  FINDINGS: The heart size is normal, mediastinal contours are unchanged. Minimal linear atelectasis or scarring in the left midlung zone. Pulmonary vasculature is normal. No consolidation, pleural effusion, or pneumothorax. No acute osseous abnormalities are seen. There is dextroscoliotic curvature of the mid upper thoracic spine, unchanged.  IMPRESSION: No acute pulmonary process.   Electronically Signed   By: Rubye Oaks M.D.   On: 10/05/2014 21:16     EKG Interpretation   Date/Time:  Saturday October 05 2014 19:58:52 EDT Ventricular Rate:  66 PR Interval:  190 QRS Duration: 90 QT Interval:  430 QTC Calculation: 450 R Axis:   20 Text Interpretation:  Normal sinus rhythm Normal ECG No significant change  since last tracing Confirmed by Bebe Shaggy  MD, Kayci [place] ([id number]) on 10/05/2014  11:07:12 PM     ED ECG REPORT   Date: 10/05/2014 2330  Rate: 61  Rhythm: normal sinus rhythm  QRS Axis: normal  Intervals: normal  ST/T Wave abnormalities: normal  Conduction Disutrbances:none  Narrative Interpretation:   Old EKG Reviewed: unchanged   MDM   Final diagnoses:  None    Nursing notes including past medical history and social history reviewed and considered in documentation Labs/vital reviewed myself and considered during evaluation xrays/imaging reviewed by myself and considered during evaluation    I personally performed the services described in this documentation, which was scribed in my presence. The recorded information has been reviewed and is accurate.      Zadie Rhineonald Josefina Rynders, MD 10/05/14 [phone]

## 2014-10-05 NOTE — ED Notes (Signed)
Onset 2 weeks intermittant heart palpitations and tightness mid chest.  Onset today pt got short of breath, dizzy and weak with tightening in chest.  Pt able to speak in complete sentences.  No c/o dizziness at this time.

## 2014-10-09 ENCOUNTER — Ambulatory Visit (INDEPENDENT_AMBULATORY_CARE_PROVIDER_SITE_OTHER): Payer: Self-pay | Admitting: Family Medicine

## 2014-10-09 ENCOUNTER — Encounter: Payer: Self-pay | Admitting: Family Medicine

## 2014-10-09 VITALS — BP 139/80 | HR 68 | Temp 98.0°F | Ht 65.0 in | Wt 250.8 lb

## 2014-10-09 DIAGNOSIS — I10 Essential (primary) hypertension: Secondary | ICD-10-CM

## 2014-10-09 DIAGNOSIS — R002 Palpitations: Secondary | ICD-10-CM

## 2014-10-09 DIAGNOSIS — Z0000 Encounter for general adult medical examination without abnormal findings: Secondary | ICD-10-CM

## 2014-10-09 DIAGNOSIS — E039 Hypothyroidism, unspecified: Secondary | ICD-10-CM

## 2014-10-09 LAB — T4, FREE: Free T4: 1.14 ng/dL (ref 0.80–1.80)

## 2014-10-09 LAB — TSH: TSH: 0.118 u[IU]/mL — AB (ref 0.350–4.500)

## 2014-10-09 NOTE — Patient Instructions (Signed)
Make sure to get your mammogram. I will call you with these results and we can decide whether to change the synthroid dose or if you need to have your heart rate monitored for a month.   If you experience chest pain, shortness of breath, or passing out please call us at [phone] and go to the ER.

## 2014-10-09 NOTE — Assessment & Plan Note (Signed)
Well controlled, continue tx w/atenolol, HCTZ

## 2014-10-09 NOTE — Assessment & Plan Note (Signed)
Possible tachycardic arrhythmia due to supratherapeutic synthroid or paroxysmal AFib. Will check thyroid and possibly get her set up for loop recorder. No chest pain, dyspnea.

## 2014-10-09 NOTE — Assessment & Plan Note (Signed)
Again urged her to complete her mammogram. She said she'd go get it done today.

## 2014-10-09 NOTE — Assessment & Plan Note (Signed)
On larger dose ( ) for a while now with steadily declining TSH values including below normal limits on last check. I suspect supratherapeutic dosing is to blame for her complaints today. It is difficult to say whether she is losing weight due to over medication or if the weight loss has caused her fixed dose to become supratherapeutic. I note she's not tachycardic and ECG in ER showed NSR, but she is beta blocked. Perhaps at end dose she is experiencing symptoms.  - Check TSH, free T4 today

## 2014-10-09 NOTE — Progress Notes (Signed)
Subjective: Cheryl Castro is a 64 y.o. female patient of mine presenting for for ED follow up and heart fluttering.  Intermittent heart fluttering "dropping" and palpitations starting about 3 weeks ago causing her to feel ill, sick to her stomach and very lightheaded. She has felt like she was going to pass out with visual changes/darkening but denies syncope. Had some chest tightness that caused her to go to the ED 2 days ago where troponin, ECG, telemetry were negative and she was sent home with PCP follow up. She does not have a cardiologist.   She notes 25 lbs weight loss (and is 250lbs today, was 22 lbs in 2015), feeling jittery. No skin or hair changes or changes in tolerance to heat or cold, though she "stays always hot."   - ROS: Denies stress, preceding anxiety.  - Nonsmoker - Meds: on synthroid 200mcg for many years  Objective: BP 139/80 mmHg  Pulse 68  Temp(Src) 98 F (36.7 C) (Oral)  Wt 250 lb 12.8 oz (113.762 kg) Gen: Obese, well-appearing 64 y.o. female in no distress CV: Regular rate, no murmur; radial, DP and PT pulses 2+ bilaterally; no LE edema, no JVD, cap refill < 2 sec. Skin: No rashes, wounds, ulcers   07/17/2014   TSH 0.054 (L)   Assessment/Plan: Cheryl Castro is a 64 y.o. female here for palpitations.  See problem list for plan.

## 2014-10-10 ENCOUNTER — Telehealth: Payer: Self-pay | Admitting: Family Medicine

## 2014-10-10 DIAGNOSIS — E039 Hypothyroidism, unspecified: Secondary | ICD-10-CM

## 2014-10-10 MED ORDER — LEVOTHYROXINE SODIUM 175 MCG PO TABS
175.0000 ug | ORAL_TABLET | Freq: Every day | ORAL | Status: DC
Start: 2014-10-10 — End: 2014-12-27

## 2014-10-10 NOTE — Assessment & Plan Note (Signed)
TSH still suppressed with normal free T4. Will trial synthroid daily x 6-8 weeks and recheck both as well as monitor her palpitations and other symptoms.

## 2014-10-10 NOTE — Telephone Encounter (Signed)
See problem list

## 2014-10-10 NOTE — Telephone Encounter (Signed)
LVM for pt to call back to inform her of below. Zimmerman Rumple, April D, CMA  

## 2014-10-11 NOTE — Telephone Encounter (Signed)
Pt returned call and was informed of below. Zimmerman Rumple, April D, CMA  

## 2014-10-16 ENCOUNTER — Other Ambulatory Visit: Payer: Self-pay | Admitting: Family Medicine

## 2014-10-16 MED ORDER — ATENOLOL 50 MG PO TABS: 50.0000 mg | ORAL_TABLET | Freq: Two times a day (BID) | ORAL | Status: DC

## 2014-10-16 NOTE — Telephone Encounter (Signed)
Needs a refill on atenolol (TENORMIN) 50 MG tablet. Cheryl Castro, ASA

## 2014-10-17 ENCOUNTER — Ambulatory Visit: Payer: Self-pay

## 2014-11-05 ENCOUNTER — Ambulatory Visit: Payer: Self-pay | Admitting: Family Medicine

## 2014-12-25 ENCOUNTER — Telehealth: Payer: Self-pay | Admitting: Family Medicine

## 2014-12-27 ENCOUNTER — Other Ambulatory Visit: Payer: Self-pay | Admitting: Family Medicine

## 2014-12-27 DIAGNOSIS — E039 Hypothyroidism, unspecified: Secondary | ICD-10-CM

## 2014-12-27 MED ORDER — LEVOTHYROXINE SODIUM 175 MCG PO TABS: 175.0000 ug | ORAL_TABLET | Freq: Every day | ORAL | Status: DC

## 2014-12-27 NOTE — Telephone Encounter (Signed)
Pt needs a refill on her levothyroxine as she is out. She states that she asked the pharmacy to send a request last week. Sadie Reynolds, ASA

## 2014-12-27 NOTE — Telephone Encounter (Signed)
I will refill this, but she must make an appointment to have labs to recheck things. My next available appointment is a ways away so I will fill 3 months of this dose, but please make sure she schedules something.

## 2014-12-30 NOTE — Telephone Encounter (Signed)
Left voice message for patient to call for a follow up appointment before medication is out.  Clovis Pu, RN

## 2015-01-11 ENCOUNTER — Emergency Department (HOSPITAL_COMMUNITY): Payer: Self-pay

## 2015-01-11 ENCOUNTER — Encounter (HOSPITAL_COMMUNITY): Payer: Self-pay | Admitting: Emergency Medicine

## 2015-01-11 ENCOUNTER — Emergency Department (HOSPITAL_COMMUNITY)
Admission: EM | Admit: 2015-01-11 | Discharge: 2015-01-11 | Disposition: A | Discharge status: Home / Self Care | Payer: Self-pay | Attending: Emergency Medicine | Admitting: Emergency Medicine

## 2015-01-11 DIAGNOSIS — I10 Essential (primary) hypertension: Secondary | ICD-10-CM | POA: Insufficient documentation

## 2015-01-11 DIAGNOSIS — K219 Gastro-esophageal reflux disease without esophagitis: Secondary | ICD-10-CM | POA: Insufficient documentation

## 2015-01-11 DIAGNOSIS — E079 Disorder of thyroid, unspecified: Secondary | ICD-10-CM | POA: Insufficient documentation

## 2015-01-11 DIAGNOSIS — R109 Unspecified abdominal pain: Secondary | ICD-10-CM

## 2015-01-11 DIAGNOSIS — Z87891 Personal history of nicotine dependence: Secondary | ICD-10-CM | POA: Insufficient documentation

## 2015-01-11 DIAGNOSIS — N390 Urinary tract infection, site not specified: Secondary | ICD-10-CM | POA: Insufficient documentation

## 2015-01-11 DIAGNOSIS — Z79899 Other long term (current) drug therapy: Secondary | ICD-10-CM | POA: Insufficient documentation

## 2015-01-11 DIAGNOSIS — F419 Anxiety disorder, unspecified: Secondary | ICD-10-CM | POA: Insufficient documentation

## 2015-01-11 HISTORY — DX: Diaphragmatic hernia without obstruction or gangrene: K44.9

## 2015-01-11 LAB — I-STAT TROPONIN, ED: TROPONIN I, POC: 0 ng/mL (ref 0.00–0.08)

## 2015-01-11 LAB — CBC WITH DIFFERENTIAL/PLATELET
Basophils Absolute: 0 10*3/uL (ref 0.0–0.1)
Basophils Relative: 0 % (ref 0–1)
Eosinophils Absolute: 0.2 10*3/uL (ref 0.0–0.7)
Eosinophils Relative: 4 % (ref 0–5)
HEMATOCRIT: 39.8 % (ref 36.0–46.0)
HEMOGLOBIN: 12.8 g/dL (ref 12.0–15.0)
Lymphocytes Relative: 36 % (ref 12–46)
Lymphs Abs: 1.8 10*3/uL (ref 0.7–4.0)
MCH: 27.9 pg (ref 26.0–34.0)
MCHC: 32.2 g/dL (ref 30.0–36.0)
MCV: 86.9 fL (ref 78.0–100.0)
MONOS PCT: 7 % (ref 3–12)
Monocytes Absolute: 0.4 10*3/uL (ref 0.1–1.0)
NEUTROS ABS: 2.7 10*3/uL (ref 1.7–7.7)
Neutrophils Relative %: 53 % (ref 43–77)
Platelets: 325 10*3/uL (ref 150–400)
RBC: 4.58 MIL/uL (ref 3.87–5.11)
RDW: 13.4 % (ref 11.5–15.5)
WBC: 5 10*3/uL (ref 4.0–10.5)

## 2015-01-11 LAB — URINALYSIS, ROUTINE W REFLEX MICROSCOPIC
BILIRUBIN URINE: NEGATIVE
Glucose, UA: NEGATIVE mg/dL
Ketones, ur: NEGATIVE mg/dL
Nitrite: NEGATIVE
PH: 5.5 (ref 5.0–8.0)
Protein, ur: NEGATIVE mg/dL
Specific Gravity, Urine: 1.018 (ref 1.005–1.030)
UROBILINOGEN UA: 0.2 mg/dL (ref 0.0–1.0)

## 2015-01-11 LAB — COMPREHENSIVE METABOLIC PANEL
ALK PHOS: 94 U/L (ref 38–126)
ALT: 24 U/L (ref 14–54)
AST: 27 U/L (ref 15–41)
Albumin: 3.6 g/dL (ref 3.5–5.0)
Anion gap: 6 (ref 5–15)
BILIRUBIN TOTAL: 0.5 mg/dL (ref 0.3–1.2)
BUN: 14 mg/dL (ref 6–20)
CALCIUM: 9.1 mg/dL (ref 8.9–10.3)
CO2: 28 mmol/L (ref 22–32)
Chloride: 102 mmol/L (ref 101–111)
Creatinine, Ser: 0.65 mg/dL (ref 0.44–1.00)
GFR calc Af Amer: >60 mL/min (ref >60)
GFR calc non Af Amer: >60 mL/min (ref >60)
GLUCOSE: 109 mg/dL — AB (ref 65–99)
Potassium: 4.2 mmol/L (ref 3.5–5.1)
Sodium: 136 mmol/L (ref 135–145)
Total Protein: 7 g/dL (ref 6.5–8.1)

## 2015-01-11 LAB — LIPASE, BLOOD: Lipase: 23 U/L (ref 22–51)

## 2015-01-11 LAB — URINE MICROSCOPIC-ADD ON

## 2015-01-11 MED ORDER — ONDANSETRON HCL 4 MG/2ML IJ SOLN
4.0000 mg | Freq: Once | INTRAMUSCULAR | Status: AC
  Administered 2015-01-11: 4 mg via INTRAVENOUS
  Filled 2015-01-11: qty 2

## 2015-01-11 MED ORDER — SODIUM CHLORIDE 0.9 % IV BOLUS (SEPSIS)
1000.0000 mL | Freq: Once | INTRAVENOUS | Status: AC
  Administered 2015-01-11: 1000 mL via INTRAVENOUS

## 2015-01-11 MED ORDER — CEFTRIAXONE SODIUM 1 G IJ SOLR
1.0000 g | Freq: Once | INTRAMUSCULAR | Status: AC
  Administered 2015-01-11: 1 g via INTRAVENOUS
  Filled 2015-01-11: qty 10

## 2015-01-11 MED ORDER — KETOROLAC TROMETHAMINE 30 MG/ML IJ SOLN
30.0000 mg | Freq: Once | INTRAMUSCULAR | Status: AC
  Administered 2015-01-11: 30 mg via INTRAVENOUS
  Filled 2015-01-11: qty 1

## 2015-01-11 MED ORDER — CEPHALEXIN 500 MG PO CAPS: 1000.0000 mg | ORAL_CAPSULE | Freq: Three times a day (TID) | ORAL | Status: DC

## 2015-01-11 MED ORDER — HYDROCODONE-ACETAMINOPHEN 5-325 MG PO TABS: 1.0000 | ORAL_TABLET | ORAL | Status: DC | PRN

## 2015-01-11 MED ORDER — MORPHINE SULFATE (PF) 4 MG/ML IV SOLN
4.0000 mg | Freq: Once | INTRAVENOUS | Status: AC
Start: 2015-01-11 — End: 2015-01-11
  Administered 2015-01-11: 4 mg via INTRAVENOUS
  Filled 2015-01-11: qty 1

## 2015-01-11 NOTE — ED Provider Notes (Signed)
CSN: [id number]     Arrival date & time 01/11/15  0813 History   First MD Initiated Contact with Patient 01/11/15 [phone]     Chief Complaint  Patient presents with  . Flank Pain     (Consider location/radiation/quality/duration/timing/severity/associated sxs/prior Treatment) The history is provided by the patient.  Cheryl Castro is a 64 y.o. female hx of HTN, GERD, here presenting with abdominal pain. Left upper quadrant pain radiating to the back for the last week or so. Worse with movement and getting more frequent. Had a lot of pain yesterday and had trouble sleeping last night. Denies any nausea or vomiting. Denies any constipation or diarrhea or urinary symptoms. States that she has no history of gallstones or kidney stones and no abdominal surgeries. Of note, she had previous US several years ago that showed AAA 3.3 cm in size.    Past Medical History  Diagnosis Date  . Thyroid disease   . Hypertension   . GERD (gastroesophageal reflux disease)   . Sleep apnea   . Anxiety   . Hiatal hernia    History reviewed. No pertinent past surgical history. History reviewed. No pertinent family history. Social History  Substance Use Topics  . Smoking status: Former Games developer  . Smokeless tobacco: None  . Alcohol Use: No   OB History    No data available     Review of Systems  Gastrointestinal: Positive for abdominal pain.  All other systems reviewed and are negative.     Allergies  Review of patient's allergies indicates no known allergies.  Home Medications   Prior to Admission medications   Medication Sig Start Date End Date Taking? Authorizing Provider  atenolol (TENORMIN) 50 MG tablet Take 1 tablet (50 mg total) by mouth 2 (two) times daily. 10/16/14  Yes Tyrone Nine, MD  levothyroxine (SYNTHROID, LEVOTHROID) 175 MCG tablet Take 1 tablet (175 mcg total) by mouth daily. 12/27/14  Yes Tyrone Nine, MD  PARoxetine (PAXIL) 40 MG tablet take 1 tablet by mouth once daily and  1/2 tablet by mouth EVERY AFTERNOON Patient taking differently: Take 20-40 mg by mouth 2 (two) times daily. take 1 tablet by mouth once daily and 1/2 tablet by mouth EVERY AFTERNOON 07/17/14  Yes Tyrone Nine, MD  ranitidine (ZANTAC) 150 MG capsule Take 1 capsule (150 mg total) by mouth every evening. 07/17/14 07/17/15 Yes Tyrone Nine, MD  hydrOXYzine (ATARAX/VISTARIL) 10 MG tablet Take 2 tablets (20 mg total) by mouth at bedtime as needed. 04/08/14   Tyrone Nine, MD   BP 136/60 mmHg  Pulse 59  Temp(Src) 98.3 F (36.8 C) (Oral)  Resp 18  Ht  (1.651 m)  Wt 250 lb (113.399 kg)  BMI 41.60 kg/m2  SpO2 98% Physical Exam  Constitutional: She is oriented to person, place, and time.  Slightly uncomfortable   HENT:  Head: Normocephalic.  Mouth/Throat: Oropharynx is clear and moist.  Eyes: Conjunctivae are normal. Pupils are equal, round, and reactive to light.  Neck: Normal range of motion. Neck supple.  Cardiovascular: Normal rate, regular rhythm and normal heart sounds.   Pulmonary/Chest: Effort normal and breath sounds normal. No respiratory distress. She has no wheezes. She has no rales.  Abdominal: Soft. Bowel sounds are normal.  Minimal LUQ and L CVAT. Obese, no obvious pulsatile abdominal mass.   Musculoskeletal: Normal range of motion. She exhibits no edema or tenderness.  Neurological: She is alert and oriented to person, place, and time.  Skin: Skin is warm and dry.  Psychiatric: She has a normal mood and affect. Her behavior is normal. Judgment and thought content normal.  Nursing note and vitals reviewed.   ED Course  Procedures (including critical care time) Labs Review Labs Reviewed  COMPREHENSIVE METABOLIC PANEL - Abnormal; Notable for the following:    Glucose, Bld 109 (*)    All other components within normal limits  URINALYSIS, ROUTINE W REFLEX MICROSCOPIC (NOT AT [facility]) - Abnormal; Notable for the following:    Hgb urine dipstick MODERATE (*)    Leukocytes, UA  SMALL (*)    All other components within normal limits  URINE MICROSCOPIC-ADD ON - Abnormal; Notable for the following:    Casts HYALINE CASTS (*)    All other components within normal limits  URINE CULTURE  CBC WITH DIFFERENTIAL/PLATELET  LIPASE, BLOOD  I-STAT TROPOININ, ED    Imaging Review Ct Renal Stone Study  01/11/2015   CLINICAL DATA:  Left-sided beginning a week ago  EXAM: CT ABDOMEN AND PELVIS WITHOUT CONTRAST  TECHNIQUE: Multidetector CT imaging of the abdomen and pelvis was performed following the standard protocol without IV contrast.  COMPARISON:  None.  FINDINGS: Lower chest and abdominal wall:  Tiny fatty umbilical hernia  Hepatobiliary: No focal liver abnormality.No evidence of biliary obstruction or stone.  Pancreas: Unremarkable.  Spleen: Unremarkable.  Adrenals/Urinary Tract: Negative adrenals. No hydronephrosis or stone. Unremarkable bladder.  Reproductive:No pathologic findings.  Stomach/Bowel:  No obstruction. No appendicitis.  Vascular/Lymphatic: No acute vascular abnormality. Atherosclerotic calcification, mild for age, including noted plaque near the left ureter at the pelvic brim. No mass or adenopathy.  Peritoneal: No ascites or pneumoperitoneum.  Musculoskeletal: No acute finding. Advanced lower lumbar facet arthropathy with spurring and subchondral erosive changes at L4-5 and L5-S1.  IMPRESSION: No explanation for abdominal pain.   Electronically Signed   By: Marnee Spring M.D.   On: 01/11/2015 10:32   I have personally reviewed and evaluated these images and lab results as part of my medical decision-making.   EKG Interpretation   Date/Time:  Saturday January 11 2015 09:10:07 EDT Ventricular Rate:  65 PR Interval:  205 QRS Duration: 81 QT Interval:  453 QTC Calculation: 471 R Axis:   130 Text Interpretation:  Right and left arm electrode reversal,  interpretation assumes no reversal Sinus rhythm Probable lateral infarct,  age indeterminate No significant  change since last tracing Confirmed by  Cayde Held  MD, Nakyah Erdmann ([id number]) on 01/11/2015 9:56:32 AM      MDM   Final diagnoses:  Flank pain    Cheryl Castro is a 64 y.o. female here with ab pain. Consider pancreatitis vs renal colic vs spleen pathology. Will get labs, lipase, UA. If UA showed RBC then will get renal stone study or else will get CT ab/pel with IV contrast.   12:03 PM UA ? UTI, + blood. CT showed no stone right now. Possibly passed stone or has L pyelo. Given rocephin, will dc home with keflex, pain meds. Pain controlled, no vomiting. Labs otherwise unremarkable.    Richardean Canal, MD 01/11/15 [phone]

## 2015-01-11 NOTE — ED Notes (Signed)
Pt started having pain in left side approx a week ago. Worse on palpation- pt states the pain goes into her back when she pushes on it. Pt denies N/V.

## 2015-01-11 NOTE — ED Notes (Signed)
Patient undressed, in gown, on continuous pulse oximetry and blood pressure cuff 

## 2015-01-11 NOTE — Discharge Instructions (Signed)
Take keflex three times daily for a week.   Stay hydrated.   Take motrin for pain.  Take vicodin for severe pain.   See your doctor.   Return to ER if you have worse pain, vomiting, fever.

## 2015-01-11 NOTE — ED Notes (Signed)
Pt comfortable with discharge and follow up instructions. Pt declines wheelchair, escorted to waiting area by this RN. Prescriptions x2. 

## 2015-01-14 ENCOUNTER — Emergency Department (HOSPITAL_COMMUNITY)
Admission: EM | Admit: 2015-01-14 | Discharge: 2015-01-15 | Disposition: A | Discharge status: Home / Self Care | Payer: Self-pay | Attending: Emergency Medicine | Admitting: Emergency Medicine

## 2015-01-14 ENCOUNTER — Encounter (HOSPITAL_COMMUNITY): Payer: Self-pay | Admitting: Vascular Surgery

## 2015-01-14 DIAGNOSIS — R1012 Left upper quadrant pain: Secondary | ICD-10-CM | POA: Insufficient documentation

## 2015-01-14 DIAGNOSIS — Z87891 Personal history of nicotine dependence: Secondary | ICD-10-CM | POA: Insufficient documentation

## 2015-01-14 DIAGNOSIS — R1013 Epigastric pain: Secondary | ICD-10-CM | POA: Insufficient documentation

## 2015-01-14 DIAGNOSIS — K219 Gastro-esophageal reflux disease without esophagitis: Secondary | ICD-10-CM | POA: Insufficient documentation

## 2015-01-14 DIAGNOSIS — Z79899 Other long term (current) drug therapy: Secondary | ICD-10-CM | POA: Insufficient documentation

## 2015-01-14 DIAGNOSIS — E079 Disorder of thyroid, unspecified: Secondary | ICD-10-CM | POA: Insufficient documentation

## 2015-01-14 DIAGNOSIS — Z8669 Personal history of other diseases of the nervous system and sense organs: Secondary | ICD-10-CM | POA: Insufficient documentation

## 2015-01-14 DIAGNOSIS — Z792 Long term (current) use of antibiotics: Secondary | ICD-10-CM | POA: Insufficient documentation

## 2015-01-14 DIAGNOSIS — I10 Essential (primary) hypertension: Secondary | ICD-10-CM | POA: Insufficient documentation

## 2015-01-14 DIAGNOSIS — F419 Anxiety disorder, unspecified: Secondary | ICD-10-CM | POA: Insufficient documentation

## 2015-01-14 LAB — BASIC METABOLIC PANEL
ANION GAP: 9 (ref 5–15)
BUN: 14 mg/dL (ref 6–20)
CHLORIDE: 104 mmol/L (ref 101–111)
CO2: 22 mmol/L (ref 22–32)
Calcium: 9.2 mg/dL (ref 8.9–10.3)
Creatinine, Ser: 0.58 mg/dL (ref 0.44–1.00)
GFR calc Af Amer: >60 mL/min (ref >60)
GFR calc non Af Amer: >60 mL/min (ref >60)
GLUCOSE: 121 mg/dL — AB (ref 65–99)
POTASSIUM: 3.9 mmol/L (ref 3.5–5.1)
Sodium: 135 mmol/L (ref 135–145)

## 2015-01-14 LAB — CBC
HEMATOCRIT: 42.4 % (ref 36.0–46.0)
HEMOGLOBIN: 13.7 g/dL (ref 12.0–15.0)
MCH: 28.1 pg (ref 26.0–34.0)
MCHC: 32.3 g/dL (ref 30.0–36.0)
MCV: 86.9 fL (ref 78.0–100.0)
Platelets: 339 10*3/uL (ref 150–400)
RBC: 4.88 MIL/uL (ref 3.87–5.11)
RDW: 13.5 % (ref 11.5–15.5)
WBC: 6.3 10*3/uL (ref 4.0–10.5)

## 2015-01-14 LAB — URINE MICROSCOPIC-ADD ON

## 2015-01-14 LAB — URINALYSIS, ROUTINE W REFLEX MICROSCOPIC
BILIRUBIN URINE: NEGATIVE
Glucose, UA: NEGATIVE mg/dL
Ketones, ur: NEGATIVE mg/dL
Leukocytes, UA: NEGATIVE
NITRITE: NEGATIVE
PH: 5 (ref 5.0–8.0)
Protein, ur: NEGATIVE mg/dL
SPECIFIC GRAVITY, URINE: 1.009 (ref 1.005–1.030)
UROBILINOGEN UA: 0.2 mg/dL (ref 0.0–1.0)

## 2015-01-14 LAB — LIPASE, BLOOD: Lipase: 23 U/L (ref 22–51)

## 2015-01-14 LAB — HEPATIC FUNCTION PANEL
ALBUMIN: 3.7 g/dL (ref 3.5–5.0)
ALT: 28 U/L (ref 14–54)
AST: 29 U/L (ref 15–41)
Alkaline Phosphatase: 94 U/L (ref 38–126)
BILIRUBIN TOTAL: 0.1 mg/dL — AB (ref 0.3–1.2)
Bilirubin, Direct: <0.1 mg/dL — ABNORMAL LOW (ref 0.1–0.5)
TOTAL PROTEIN: 7.1 g/dL (ref 6.5–8.1)

## 2015-01-14 LAB — URINE CULTURE

## 2015-01-14 LAB — I-STAT TROPONIN, ED: TROPONIN I, POC: 0 ng/mL (ref 0.00–0.08)

## 2015-01-14 MED ORDER — IBUPROFEN 200 MG PO TABS
600.0000 mg | ORAL_TABLET | Freq: Once | ORAL | Status: AC
  Administered 2015-01-14: 600 mg via ORAL
  Filled 2015-01-14 (×2): qty 1

## 2015-01-14 MED ORDER — LIDOCAINE VISCOUS 2 % MT SOLN
15.0000 mL | Freq: Once | OROMUCOSAL | Status: AC
  Administered 2015-01-14: 15 mL via OROMUCOSAL
  Filled 2015-01-14: qty 15

## 2015-01-14 MED ORDER — ALUM & MAG HYDROXIDE-SIMETH 200-200-20 MG/5ML PO SUSP
15.0000 mL | Freq: Once | ORAL | Status: AC
  Administered 2015-01-14: 15 mL via ORAL
  Filled 2015-01-14: qty 30

## 2015-01-14 MED ORDER — ONDANSETRON HCL 4 MG/2ML IJ SOLN: 4.0000 mg | Freq: Once | INTRAMUSCULAR | Status: DC

## 2015-01-14 MED ORDER — ONDANSETRON HCL 4 MG PO TABS
4.0000 mg | ORAL_TABLET | Freq: Once | ORAL | Status: AC
  Administered 2015-01-14: 4 mg via ORAL
  Filled 2015-01-14: qty 1

## 2015-01-14 NOTE — ED Notes (Signed)
Pt reports to the ED for eval of upper abd pain. She reports she was seen recently and treated for a UTI. She also reports SOB and back pain. Pt reports she has been compliant with her abx and her pain medication but her symptoms have not resolved. Reports some diarrhea but denies any N/V. Pt A&Ox4, resp e/u, and skin warm and dry.

## 2015-01-14 NOTE — Discharge Instructions (Signed)
Take zantac as needed for pain.  Take 4 over the counter ibuprofen tablets 3 times a day or 2 over-the-counter naproxen tablets twice a day for pain.   Abdominal Pain Many things can cause abdominal pain. Usually, abdominal pain is not caused by a disease and will improve without treatment. It can often be observed and treated at home. Your health care provider will do a physical exam and possibly order blood tests and X-rays to help determine the seriousness of your pain. However, in many cases, more time must pass before a clear cause of the pain can be found. Before that point, your health care provider may not know if you need more testing or further treatment. HOME CARE INSTRUCTIONS  Monitor your abdominal pain for any changes. The following actions may help to alleviate any discomfort you are experiencing:  Only take over-the-counter or prescription medicines as directed by your health care provider.  Do not take laxatives unless directed to do so by your health care provider.  Try a clear liquid diet (broth, tea, or water) as directed by your health care provider. Slowly move to a bland diet as tolerated. SEEK MEDICAL CARE IF:  You have unexplained abdominal pain.  You have abdominal pain associated with nausea or diarrhea.  You have pain when you urinate or have a bowel movement.  You experience abdominal pain that wakes you in the night.  You have abdominal pain that is worsened or improved by eating food.  You have abdominal pain that is worsened with eating fatty foods.  You have a fever. SEEK IMMEDIATE MEDICAL CARE IF:   Your pain does not go away within 2 hours.  You keep throwing up (vomiting).  Your pain is felt only in portions of the abdomen, such as the right side or the left lower portion of the abdomen.  You pass bloody or black tarry stools. MAKE SURE YOU:  Understand these instructions.   Will watch your condition.   Will get help right away if you  are not doing well or get worse.  Document Released: 01/27/2005 Document Revised: 04/24/2013 Document Reviewed: 12/27/2012 [facility] Patient Information 2015 [place], [place]. This information is not intended to replace advice given to you by your health care provider. Make sure you discuss any questions you have with your health care provider.

## 2015-01-14 NOTE — ED Provider Notes (Signed)
CSN: [id number]     Arrival date & time 01/14/15  1604 History   First MD Initiated Contact with Patient 01/14/15 2211     Chief Complaint  Patient presents with  . Abdominal Pain     (Consider location/radiation/quality/duration/timing/severity/associated sxs/prior Treatment) Patient is a 64 y.o. female presenting with abdominal pain. The history is provided by the patient.  Abdominal Pain Pain location:  Epigastric Pain quality: sharp, shooting and squeezing   Pain radiates to:  Does not radiate Pain severity:  Moderate Onset quality:  Sudden Duration:  2 weeks Timing:  Constant Progression:  Unchanged Chronicity:  New Relieved by:  Nothing Worsened by:  Movement and palpation Ineffective treatments:  None tried Associated symptoms: no chest pain, no chills, no constipation, no cough, no dysuria, no fever, no hematuria, no nausea, no shortness of breath and no vomiting    64 yo F with a chief complaint of epigastric abdominal pain. Patient states it feels like an ache worse when she moves around or pushes on the area. Patient states that putting hot packs on it makes it slightly better. Patient denies any vomiting diarrhea. Patient denies any fevers or chills. Pain has a burning sensation radiates upwards into her chest. Denies diaphoresis  Past Medical History  Diagnosis Date  . Thyroid disease   . Hypertension   . GERD (gastroesophageal reflux disease)   . Sleep apnea   . Anxiety   . Hiatal hernia    History reviewed. No pertinent past surgical history. No family history on file. Social History  Substance Use Topics  . Smoking status: Former Games developer  . Smokeless tobacco: None  . Alcohol Use: No   OB History    No data available     Review of Systems  Constitutional: Negative for fever and chills.  HENT: Negative for congestion and rhinorrhea.   Eyes: Negative for redness and visual disturbance.  Respiratory: Negative for cough, shortness of breath and wheezing.    Cardiovascular: Negative for chest pain and palpitations.  Gastrointestinal: Positive for abdominal pain. Negative for nausea, vomiting, constipation and blood in stool.  Genitourinary: Negative for dysuria, urgency and hematuria.  Musculoskeletal: Negative for myalgias and arthralgias.  Skin: Negative for pallor and wound.  Neurological: Negative for dizziness and headaches.      Allergies  Review of patient's allergies indicates no known allergies.  Home Medications   Prior to Admission medications   Medication Sig Start Date End Date Taking? Authorizing Provider  atenolol (TENORMIN) 50 MG tablet Take 1 tablet (50 mg total) by mouth 2 (two) times daily. 10/16/14  Yes Tyrone Nine, MD  cephALEXin (KEFLEX) 500 MG capsule Take 2 capsules (1,000 mg total) by mouth 3 (three) times daily. 01/11/15  Yes Richardean Canal, MD  CHOLECALCIFEROL PO Take 1 tablet by mouth daily.   Yes Historical Provider, MD  Cyanocobalamin (VITAMIN B 12 PO) Take 1 tablet by mouth daily.   Yes Historical Provider, MD  Diethylpropion HCl (TENUATE PO) Take 0.5-1 tablets by mouth 2 (two) times daily. Takes 1 tablet in the morning and half tablet in the afternoon   Yes Historical Provider, MD  HYDROcodone-acetaminophen (NORCO/VICODIN) 5-325 MG per tablet Take 1-2 tablets by mouth every 4 (four) hours as needed. 01/11/15  Yes Richardean Canal, MD  levothyroxine (SYNTHROID, LEVOTHROID) 175 MCG tablet Take 1 tablet (175 mcg total) by mouth daily. 12/27/14  Yes Tyrone Nine, MD  PARoxetine (PAXIL) 40 MG tablet take 1 tablet by mouth once  daily and 1/2 tablet by mouth EVERY AFTERNOON Patient taking differently: Take 20-40 mg by mouth 2 (two) times daily. take 1 tablet by mouth once daily and 1/2 tablet by mouth EVERY AFTERNOON 07/17/14  Yes Tyrone Nine, MD  hydrOXYzine (ATARAX/VISTARIL) 10 MG tablet Take 2 tablets (20 mg total) by mouth at bedtime as needed. 04/08/14   Tyrone Nine, MD  ranitidine (ZANTAC) 150 MG capsule Take 1 capsule  (150 mg total) by mouth every evening. Patient not taking: Reported on 01/14/2015 07/17/14 07/17/15  Tyrone Nine, MD   BP 127/93 mmHg  Pulse 68  Temp(Src) 98.5 F (36.9 C) (Oral)  Resp 18  Ht 5\' 5"  (1.651 m)  Wt 250 lb (113.399 kg)  BMI 41.60 kg/m2  SpO2 100% Physical Exam  Constitutional: She is oriented to person, place, and time. She appears well-developed and well-nourished. No distress.  HENT:  Head: Normocephalic and atraumatic.  Eyes: EOM are normal. Pupils are equal, round, and reactive to light.  Neck: Normal range of motion. Neck supple.  Cardiovascular: Normal rate and regular rhythm.  Exam reveals no gallop and no friction rub.   No murmur heard. Pulmonary/Chest: Effort normal. She has no wheezes. She has no rales.  Abdominal: Soft. She exhibits no distension. There is tenderness (Mild subcostal tenderness about the left upper quadrant). There is no rebound and no guarding.  Musculoskeletal: She exhibits no edema or tenderness.  Neurological: She is alert and oriented to person, place, and time.  Skin: Skin is warm and dry. She is not diaphoretic.  Psychiatric: She has a normal mood and affect. Her behavior is normal.    ED Course  Procedures (including critical care time) Labs Review Labs Reviewed  BASIC METABOLIC PANEL - Abnormal; Notable for the following:    Glucose, Bld 121 (*)    All other components within normal limits  URINALYSIS, ROUTINE W REFLEX MICROSCOPIC (NOT AT [facility]) - Abnormal; Notable for the following:    Hgb urine dipstick SMALL (*)    All other components within normal limits  HEPATIC FUNCTION PANEL - Abnormal; Notable for the following:    Total Bilirubin 0.1 (*)    Bilirubin, Direct <0.1 (*)    All other components within normal limits  URINE MICROSCOPIC-ADD ON - Abnormal; Notable for the following:    Squamous Epithelial / LPF FEW (*)    All other components within normal limits  CBC  LIPASE, BLOOD  I-STAT TROPOININ, ED    Imaging  Review No results found. I have personally reviewed and evaluated these images and lab results as part of my medical decision-making.   EKG Interpretation None      MDM   Final diagnoses:  Epigastric pain    64 yo F with a chief complaint of epigastric pain. Seen here 2 days ago had a CT scan stone study that was negative. Patient states that she start on antibiotics at bedtime for possible UTI. Patient feels that her symptoms have not improved. Was given a GI cocktail with vast improvement of her symptoms. We'll treat as possible GI symptoms. Benign abdomen feel no need for repeat imaging at this time.  12:16 AM:  I have discussed the diagnosis/risks/treatment options with the patient and believe the pt to be eligible for discharge home to follow-up with PCP. We also discussed returning to the ED immediately if new or worsening sx occur. We discussed the sx which are most concerning (e.g., sudden worsening pain) that necessitate immediate return.  Medications administered to the patient during their visit and any new prescriptions provided to the patient are listed below.  Medications given during this visit Medications  alum & mag hydroxide-simeth (MAALOX/MYLANTA) 200-200-20 MG/5ML suspension 15 mL (15 mLs Oral Given 01/14/15 2307)  lidocaine (XYLOCAINE) 2 % viscous mouth solution 15 mL (15 mLs Mouth/Throat Given 01/14/15 2307)  ibuprofen (ADVIL,MOTRIN) tablet 600 mg (600 mg Oral Given 01/14/15 2307)  ondansetron (ZOFRAN) tablet 4 mg (4 mg Oral Given 01/14/15 2307)    New Prescriptions   No medications on file     The patient appears reasonably screen and/or stabilized for discharge and I doubt any other medical condition or other [facility] requiring further screening, evaluation, or treatment in the ED at this time prior to discharge.      Melene Plan, DO 01/15/15 [phone]

## 2015-01-15 NOTE — ED Notes (Signed)
Pt ambulating independently w/ steady gait on d/c in no acute distress, A&Ox4. D/c instructions reviewed w/ pt - pt denies any further questions or concerns at present.  

## 2015-01-23 ENCOUNTER — Ambulatory Visit: Payer: Self-pay | Admitting: Family Medicine

## 2015-01-27 ENCOUNTER — Ambulatory Visit: Payer: Self-pay | Admitting: Family Medicine

## 2015-02-03 ENCOUNTER — Other Ambulatory Visit: Payer: Self-pay | Admitting: *Deleted

## 2015-02-03 MED ORDER — ATENOLOL 50 MG PO TABS: 50.0000 mg | ORAL_TABLET | Freq: Two times a day (BID) | ORAL | Status: DC

## 2015-04-02 ENCOUNTER — Telehealth: Payer: Self-pay | Admitting: Family Medicine

## 2015-04-02 DIAGNOSIS — E039 Hypothyroidism, unspecified: Secondary | ICD-10-CM

## 2015-04-02 MED ORDER — LEVOTHYROXINE SODIUM 175 MCG PO TABS: 175.0000 ug | ORAL_TABLET | Freq: Every day | ORAL | Status: DC

## 2015-04-02 NOTE — Telephone Encounter (Signed)
Needs refill on levothyroxine  [facility] [address] Has appt 05/01/15

## 2015-05-01 ENCOUNTER — Ambulatory Visit (INDEPENDENT_AMBULATORY_CARE_PROVIDER_SITE_OTHER): Payer: Self-pay | Admitting: Family Medicine

## 2015-05-01 ENCOUNTER — Encounter: Payer: Self-pay | Admitting: Family Medicine

## 2015-05-01 VITALS — BP 146/68 | HR 76 | Temp 97.9°F | Wt 261.0 lb

## 2015-05-01 DIAGNOSIS — M25571 Pain in right ankle and joints of right foot: Secondary | ICD-10-CM

## 2015-05-01 DIAGNOSIS — M25579 Pain in unspecified ankle and joints of unspecified foot: Secondary | ICD-10-CM | POA: Insufficient documentation

## 2015-05-01 DIAGNOSIS — F329 Major depressive disorder, single episode, unspecified: Secondary | ICD-10-CM

## 2015-05-01 DIAGNOSIS — E039 Hypothyroidism, unspecified: Secondary | ICD-10-CM

## 2015-05-01 DIAGNOSIS — F32A Depression, unspecified: Secondary | ICD-10-CM

## 2015-05-01 DIAGNOSIS — K2960 Other gastritis without bleeding: Secondary | ICD-10-CM

## 2015-05-01 DIAGNOSIS — I10 Essential (primary) hypertension: Secondary | ICD-10-CM

## 2015-05-01 LAB — URIC ACID: Uric Acid, Serum: 7.3 mg/dL — ABNORMAL HIGH (ref 2.4–7.0)

## 2015-05-01 MED ORDER — RANITIDINE HCL 150 MG PO CAPS: 150.0000 mg | ORAL_CAPSULE | Freq: Every evening | ORAL | Status: DC

## 2015-05-01 MED ORDER — PAROXETINE HCL 40 MG PO TABS: 40.0000 mg | ORAL_TABLET | Freq: Every day | ORAL | Status: DC

## 2015-05-01 NOTE — Assessment & Plan Note (Signed)
Recheck TSH which has been historically variable. Most recent values would suggest supratherapeutic dosing ( ), but will call pt to discuss next steps based on this. No overt symptoms of hypo/hyperthyroidism.

## 2015-05-01 NOTE — Assessment & Plan Note (Signed)
History strongly suspicious for gout. Will check uric acid today. Advised on lifestyle interventions regardless and that she should present ASAP when/if symptoms return.

## 2015-05-01 NOTE — Assessment & Plan Note (Signed)
Stable on paxil, will refill this.

## 2015-05-01 NOTE — Assessment & Plan Note (Signed)
At goal most days per report, so recording slightly above goal (146/68) today is ok. Will have her make a BP record and follow up. Pt has been stable on beta blocker monotherapy. Would avoid thiazides with possible gout.

## 2015-05-01 NOTE — Assessment & Plan Note (Signed)
Stable. Refill zantac.

## 2015-05-01 NOTE — Patient Instructions (Signed)
Thank you for coming in today!  We are checking some labs today, and we'll call you if they are abnormal. If you do not hear from me by phone or letter in 2 weeks, please call us as we may have been unable to reach you.   Our clinic's number is [phone]. Feel free to call any time with questions or concerns. We will answer any questions after hours with our 24-hour emergency line at that number as well.   - Dr. Jarvis NewcomerGrunz   Gout Gout is an inflammatory arthritis caused by a buildup of uric acid crystals in the joints. Uric acid is a chemical that is normally present in the blood. When the level of uric acid in the blood is too high it can form crystals that deposit in your joints and tissues. This causes joint redness, soreness, and swelling (inflammation). Repeat attacks are common. Over time, uric acid crystals can form into masses (tophi) near a joint, destroying bone and causing disfigurement. Gout is treatable and often preventable. CAUSES  The disease begins with elevated levels of uric acid in the blood. Uric acid is produced by your body when it breaks down a naturally found substance called purines. Certain foods you eat, such as meats and fish, contain high amounts of purines. Causes of an elevated uric acid level include:  Being passed down from parent to child (heredity).  Diseases that cause increased uric acid production (such as obesity, psoriasis, and certain cancers).  Excessive alcohol use.  Diet, especially diets rich in meat and seafood.  Medicines, including certain cancer-fighting medicines (chemotherapy), water pills (diuretics), and aspirin.  Chronic kidney disease. The kidneys are no longer able to remove uric acid well.  Problems with metabolism. Conditions strongly associated with gout include:  Obesity.  High blood pressure.  High cholesterol.  Diabetes. Not everyone with elevated uric acid levels gets gout. It is not understood why some people get gout  and others do not. Surgery, joint injury, and eating too much of certain foods are some of the factors that can lead to gout attacks. SYMPTOMS   An attack of gout comes on quickly. It causes intense pain with redness, swelling, and warmth in a joint.  Fever can occur.  Often, only one joint is involved. Certain joints are more commonly involved:  Base of the big toe.  Knee.  Ankle.  Wrist.  Finger. Without treatment, an attack usually goes away in a few days to weeks. Between attacks, you usually will not have symptoms, which is different from many other forms of arthritis. DIAGNOSIS  Your caregiver will suspect gout based on your symptoms and exam. In some cases, tests may be recommended. The tests may include:  Blood tests.  Urine tests.  X-rays.  Joint fluid exam. This exam requires a needle to remove fluid from the joint (arthrocentesis). Using a microscope, gout is confirmed when uric acid crystals are seen in the joint fluid. TREATMENT  There are two phases to gout treatment: treating the sudden onset (acute) attack and preventing attacks (prophylaxis).  Treatment of an Acute Attack.  Medicines are used. These include anti-inflammatory medicines or steroid medicines.  An injection of steroid medicine into the affected joint is sometimes necessary.  The painful joint is rested. Movement can worsen the arthritis.  You may use warm or cold treatments on painful joints, depending which works best for you.  Treatment to Prevent Attacks.  If you suffer from frequent gout attacks, your caregiver may advise preventive  medicine. These medicines are started after the acute attack subsides. These medicines either help your kidneys eliminate uric acid from your body or decrease your uric acid production. You may need to stay on these medicines for a very long time.  The early phase of treatment with preventive medicine can be associated with an increase in acute gout attacks.  For this reason, during the first few months of treatment, your caregiver may also advise you to take medicines usually used for acute gout treatment. Be sure you understand your caregiver's directions. Your caregiver may make several adjustments to your medicine dose before these medicines are effective.  Discuss dietary treatment with your caregiver or dietitian. Alcohol and drinks high in sugar and fructose and foods such as meat, poultry, and seafood can increase uric acid levels. Your caregiver or dietitian can advise you on drinks and foods that should be limited. HOME CARE INSTRUCTIONS   Do not take aspirin to relieve pain. This raises uric acid levels.  Only take over-the-counter or prescription medicines for pain, discomfort, or fever as directed by your caregiver.  Rest the joint as much as possible. When in bed, keep sheets and blankets off painful areas.  Keep the affected joint raised (elevated).  Apply warm or cold treatments to painful joints. Use of warm or cold treatments depends on which works best for you.  Use crutches if the painful joint is in your leg.  Drink enough fluids to keep your urine clear or pale yellow. This helps your body get rid of uric acid. Limit alcohol, sugary drinks, and fructose drinks.  Follow your dietary instructions. Pay careful attention to the amount of protein you eat. Your daily diet should emphasize fruits, vegetables, whole grains, and fat-free or low-fat milk products. Discuss the use of coffee, vitamin C, and cherries with your caregiver or dietitian. These may be helpful in lowering uric acid levels.  Maintain a healthy body weight. SEEK MEDICAL CARE IF:   You develop diarrhea, vomiting, or any side effects from medicines.  You do not feel better in 24 hours, or you are getting worse. SEEK IMMEDIATE MEDICAL CARE IF:   Your joint becomes suddenly more tender, and you have chills or a fever. MAKE SURE YOU:   Understand these  instructions.  Will watch your condition.  Will get help right away if you are not doing well or get worse.   This information is not intended to replace advice given to you by your health care provider. Make sure you discuss any questions you have with your health care provider.

## 2015-05-01 NOTE — Progress Notes (Signed)
Subjective: Cheryl Castro is a 64 y.o. female with a history of hypothyroidism presenting for follow up.   Joint pain: Reports now resolved episode of rapid onset right great toe pain which was severe, throbbing, better with ibuprofen. Couldn't walk or put a shoe on it. It was red and hot, lasting 3 days. This was the first episode of this type. Her brother has gout. Does not drink beer, recently lost 30lbs and gained it back rapidly. Diet has included meat but not a lot.   HTN: Checks BP at work and reports all at goal except today at our office. Denies CP, SOB, palpitations, syncope, dizziness, orthopnea, PND, frequent headaches, vision changes, claudication, leg swelling  Depression: Reports no symptoms for the past several months but would like to continue on SSRI for now.   Reflux: Stable, taking ranitidine. Denies dysphagia or weight loss.   - ROS: No changes in skin or hair, intolerance to cold or heat.  - Non-smoker - Takes atenolol 50mg  BID for HTN, synthroid 175mcg daily for hypothyroidism.    Objective: BP 146/68 mmHg  Pulse 76  Temp(Src) 97.9 F (36.6 C) (Oral)  Wt 261 lb (118.389 kg) Gen: Obese, well-appearing 64 y.o. female in no distress HEENT: MMM, posterior oropharynx clear Pulm: Non-labored; CTAB, no wheezes  CV: Regular rate, no murmur appreciated; distal pulses intact/symmetric GI: +BS; soft, non-tender, non-distended Skin: No rashes, wounds, ulcers; normal texture.  Neuro: A&Ox3, CN II-XII without deficits   08/17/2012 01/30/2013  04/13/2013  07/17/2014  10/09/2014   TSH 16.058 (H) 9.222 (H) 3.179 0.054 (L) 0.118 (L)   Assessment/Plan: Cheryl Castro is a 64 y.o. female here for follow up of chronic medical issues.  Depression Stable on paxil, will refill this.   Hypothyroidism Recheck TSH which has been historically variable. Most recent values would suggest supratherapeutic dosing (175mcg), but will call pt to discuss next steps based on this. No overt  symptoms of hypo/hyperthyroidism.   Reflux gastritis Stable. Refill zantac.   Essential hypertension, benign At goal most days per report, so recording slightly above goal (146/68) today is ok. Will have her make a BP record and follow up. Pt has been stable on beta blocker monotherapy. Would avoid thiazides with possible gout.   Pain in joint, ankle and foot History strongly suspicious for gout. Will check uric acid today. Advised on lifestyle interventions regardless and that she should present ASAP when/if symptoms return.

## 2015-05-02 LAB — TSH: TSH: 1.665 u[IU]/mL (ref 0.350–4.500)

## 2015-05-09 ENCOUNTER — Encounter: Payer: Self-pay | Admitting: Family Medicine

## 2015-07-15 ENCOUNTER — Other Ambulatory Visit: Payer: Self-pay | Admitting: Family Medicine

## 2015-07-17 ENCOUNTER — Other Ambulatory Visit: Payer: Self-pay | Admitting: Family Medicine

## 2015-10-23 ENCOUNTER — Ambulatory Visit: Payer: Self-pay

## 2015-11-03 ENCOUNTER — Ambulatory Visit: Payer: Self-pay | Admitting: Student in an Organized Health Care Education/Training Program

## 2015-11-05 ENCOUNTER — Ambulatory Visit: Payer: Self-pay | Admitting: Student in an Organized Health Care Education/Training Program

## 2016-02-04 ENCOUNTER — Encounter (HOSPITAL_COMMUNITY): Payer: Self-pay | Admitting: Emergency Medicine

## 2016-02-04 ENCOUNTER — Observation Stay (HOSPITAL_COMMUNITY)
Admission: EM | Admit: 2016-02-04 | Discharge: 2016-02-05 | Disposition: A | Discharge status: Home / Self Care | Payer: Self-pay | Attending: Internal Medicine | Admitting: Internal Medicine

## 2016-02-04 ENCOUNTER — Emergency Department (HOSPITAL_COMMUNITY): Payer: Self-pay

## 2016-02-04 DIAGNOSIS — R079 Chest pain, unspecified: Principal | ICD-10-CM | POA: Diagnosis present

## 2016-02-04 DIAGNOSIS — I10 Essential (primary) hypertension: Secondary | ICD-10-CM | POA: Insufficient documentation

## 2016-02-04 DIAGNOSIS — K219 Gastro-esophageal reflux disease without esophagitis: Secondary | ICD-10-CM

## 2016-02-04 DIAGNOSIS — Z79899 Other long term (current) drug therapy: Secondary | ICD-10-CM | POA: Insufficient documentation

## 2016-02-04 DIAGNOSIS — I209 Angina pectoris, unspecified: Secondary | ICD-10-CM

## 2016-02-04 DIAGNOSIS — E039 Hypothyroidism, unspecified: Secondary | ICD-10-CM | POA: Insufficient documentation

## 2016-02-04 DIAGNOSIS — Z8249 Family history of ischemic heart disease and other diseases of the circulatory system: Secondary | ICD-10-CM | POA: Insufficient documentation

## 2016-02-04 DIAGNOSIS — K449 Diaphragmatic hernia without obstruction or gangrene: Secondary | ICD-10-CM | POA: Insufficient documentation

## 2016-02-04 DIAGNOSIS — G4730 Sleep apnea, unspecified: Secondary | ICD-10-CM | POA: Insufficient documentation

## 2016-02-04 DIAGNOSIS — Z87891 Personal history of nicotine dependence: Secondary | ICD-10-CM | POA: Insufficient documentation

## 2016-02-04 DIAGNOSIS — E669 Obesity, unspecified: Secondary | ICD-10-CM | POA: Insufficient documentation

## 2016-02-04 DIAGNOSIS — Z6841 Body Mass Index (BMI) 40.0 and over, adult: Secondary | ICD-10-CM | POA: Insufficient documentation

## 2016-02-04 DIAGNOSIS — F411 Generalized anxiety disorder: Secondary | ICD-10-CM | POA: Insufficient documentation

## 2016-02-04 LAB — BASIC METABOLIC PANEL
ANION GAP: 8 (ref 5–15)
BUN: 9 mg/dL (ref 6–20)
CHLORIDE: 102 mmol/L (ref 101–111)
CO2: 24 mmol/L (ref 22–32)
Calcium: 9.3 mg/dL (ref 8.9–10.3)
Creatinine, Ser: 0.62 mg/dL (ref 0.44–1.00)
GFR calc Af Amer: >60 mL/min (ref >60)
GLUCOSE: 89 mg/dL (ref 65–99)
POTASSIUM: 3.8 mmol/L (ref 3.5–5.1)
SODIUM: 134 mmol/L — AB (ref 135–145)

## 2016-02-04 LAB — CBC
HEMATOCRIT: 40.4 % (ref 36.0–46.0)
HEMOGLOBIN: 13.7 g/dL (ref 12.0–15.0)
MCH: 28.1 pg (ref 26.0–34.0)
MCHC: 33.9 g/dL (ref 30.0–36.0)
MCV: 83 fL (ref 78.0–100.0)
Platelets: 318 10*3/uL (ref 150–400)
RBC: 4.87 MIL/uL (ref 3.87–5.11)
RDW: 13.4 % (ref 11.5–15.5)
WBC: 7.6 10*3/uL (ref 4.0–10.5)

## 2016-02-04 LAB — I-STAT TROPONIN, ED: TROPONIN I, POC: 0 ng/mL (ref 0.00–0.08)

## 2016-02-04 NOTE — ED Triage Notes (Signed)
Pt is c/o chest pain that started a couple of days ago but got worse tonight while she was sitting at work  Pt states she has had some shortness of breath but no other problems

## 2016-02-05 ENCOUNTER — Encounter (HOSPITAL_COMMUNITY): Payer: Self-pay | Admitting: Internal Medicine

## 2016-02-05 DIAGNOSIS — K219 Gastro-esophageal reflux disease without esophagitis: Secondary | ICD-10-CM

## 2016-02-05 DIAGNOSIS — I10 Essential (primary) hypertension: Secondary | ICD-10-CM

## 2016-02-05 DIAGNOSIS — R079 Chest pain, unspecified: Secondary | ICD-10-CM | POA: Diagnosis present

## 2016-02-05 LAB — LIPID PANEL
CHOL/HDL RATIO: 4.2 ratio
CHOLESTEROL: 162 mg/dL (ref 0–200)
HDL: 39 mg/dL — ABNORMAL LOW (ref >40)
LDL Cholesterol: 105 mg/dL — ABNORMAL HIGH (ref 0–99)
TRIGLYCERIDES: 92 mg/dL (ref <150)
VLDL: 18 mg/dL (ref 0–40)

## 2016-02-05 LAB — TSH: TSH: 0.229 u[IU]/mL — ABNORMAL LOW (ref 0.350–4.500)

## 2016-02-05 LAB — TROPONIN I
Troponin I: <0.03 ng/mL (ref <0.03)
Troponin I: <0.03 ng/mL (ref <0.03)

## 2016-02-05 MED ORDER — ENOXAPARIN SODIUM 40 MG/0.4ML ~~LOC~~ SOLN
40.0000 mg | SUBCUTANEOUS | Status: DC
  Administered 2016-02-05: 40 mg via SUBCUTANEOUS
  Filled 2016-02-05: qty 0.4

## 2016-02-05 MED ORDER — ASPIRIN 81 MG PO CHEW
324.0000 mg | CHEWABLE_TABLET | Freq: Once | ORAL | Status: AC
  Administered 2016-02-05: 324 mg via ORAL
  Filled 2016-02-05: qty 4

## 2016-02-05 MED ORDER — PAROXETINE HCL 20 MG PO TABS
40.0000 mg | ORAL_TABLET | Freq: Every day | ORAL | Status: DC
  Administered 2016-02-05: 40 mg via ORAL
  Filled 2016-02-05: qty 2

## 2016-02-05 MED ORDER — ONDANSETRON HCL 4 MG/2ML IJ SOLN: 4.0000 mg | Freq: Four times a day (QID) | INTRAMUSCULAR | Status: DC | PRN

## 2016-02-05 MED ORDER — LEVOTHYROXINE SODIUM 175 MCG PO TABS
175.0000 ug | ORAL_TABLET | Freq: Every day | ORAL | Status: DC
  Administered 2016-02-05: 175 ug via ORAL
  Filled 2016-02-05 (×2): qty 1

## 2016-02-05 MED ORDER — GI COCKTAIL ~~LOC~~
30.0000 mL | Freq: Three times a day (TID) | ORAL | Status: DC | PRN
  Administered 2016-02-05: 30 mL via ORAL
  Filled 2016-02-05: qty 30

## 2016-02-05 MED ORDER — ATENOLOL 50 MG PO TABS
50.0000 mg | ORAL_TABLET | Freq: Two times a day (BID) | ORAL | Status: DC
  Administered 2016-02-05 (×2): 50 mg via ORAL
  Filled 2016-02-05 (×2): qty 1

## 2016-02-05 MED ORDER — NITROGLYCERIN 0.4 MG SL SUBL
0.4000 mg | SUBLINGUAL_TABLET | SUBLINGUAL | Status: DC | PRN
  Administered 2016-02-05 (×2): 0.4 mg via SUBLINGUAL
  Filled 2016-02-05: qty 1

## 2016-02-05 MED ORDER — PANTOPRAZOLE SODIUM 40 MG PO TBEC: 40.0000 mg | DELAYED_RELEASE_TABLET | Freq: Two times a day (BID) | ORAL | 1 refills | Status: DC

## 2016-02-05 MED ORDER — PANTOPRAZOLE SODIUM 40 MG PO TBEC
40.0000 mg | DELAYED_RELEASE_TABLET | Freq: Two times a day (BID) | ORAL | Status: DC
  Administered 2016-02-05: 40 mg via ORAL
  Filled 2016-02-05: qty 1

## 2016-02-05 MED ORDER — ACETAMINOPHEN 325 MG PO TABS: 650.0000 mg | ORAL_TABLET | ORAL | Status: DC | PRN

## 2016-02-05 NOTE — Discharge Summary (Signed)
Physician Discharge Summary  Marylen PontoMarsha C Lesinski ZOX:[id number] DOB: [birth year] DOA: 02/04/2016  PCP: Howard PouchLauren Feng, MD  Admit date: 02/04/2016 Discharge date: 02/05/2016  Time spent: 35 minutes  Recommendations for Outpatient Follow-up:  1. Follow pending A1C 2. Patient with LDL of 105, follow in near future and decide initiation of statins base on further results  3. Please follow improvement/resolution of her symptoms with PPI use 4. Cardiology follow up for outpatient stress test    Discharge Diagnoses:  Active Problems:   Chest pain with low risk for cardiac etiology   Gastroesophageal reflux disease obesity HTN Anxiety Hiatal hernia  Hypothyroidism   Discharge Condition: stable and improved. CP free at discharge. Will follow up with PCP in 10 days and with cardiology on 02/11/16 for outpatient stress test.  Diet recommendation: heart healthy and low calorie diet   Filed Weights   02/05/16 0318  Weight: 117.9 kg (260 lb)    History of present illness:  65 y.o. female with medical history significant of HTN and obesity.  Patient presents to the ED with c/o CP.  Pain is located in central chest, pressure quality.  No radiation.  Onset earlier this evening.  Associated SOB.  Worse when lying down.  Hospital Course:  1-chest pain: with heart score of 3 -no EKG changes for ischemia on EKG -no telemetry abnormalities  -patient is pain free at discharge -given risk factors will set up stress test as an outpatient for further stratification  -will discharge on PPI BID as her discomfort is highly suspicious for GERD  2-HTN: -BP is stable/fairly well control -continue home antihypertensive regimen -advise to follow heart healthy diet  3-obesity: -Body mass index is 43.27 kg/m. -low calorie diet and increase exercise discussed with patient   4-anxiety: -continue paxil  5-GERD/hiatal hernia: -discharge on protonix  6-hypothyroidism  -will continue synthroid    Procedures:  See below for x-ray reports   Consultations:  Cardiology curbside (will set up outpatient stress test)  Discharge Exam: Vitals:   02/05/16 0318 02/05/16 0929  BP: 124/71 115/63  Pulse: 87 60  Resp: 18 18  Temp: 97.9 F (36.6 C)     General: afebrile, no SOB or orthopnea; reports no further CP at discharge Cardiovascular: S1 and S2, no rubs, no gallops, no murmurs  Respiratory: CTA bilaterally Abd: obese, soft, NT, ND, positive BS Extremities: no cyanosis or clubbing   Discharge Instructions   Discharge Instructions    Diet - low sodium heart healthy    Complete by:  As directed    Discharge instructions    Complete by:  As directed    Take medications as prescribed Maintain adequate hydration  Please follow up with PCP in 10 days and with cardiology service as instructed  Follow heart healthy and low calorie diet     Current Discharge Medication List    START taking these medications   Details  pantoprazole (PROTONIX) 40 MG tablet Take 1 tablet (40 mg total) by mouth 2 (two) times daily. Qty: 60 tablet, Refills: 1      CONTINUE these medications which have NOT CHANGED   Details  atenolol (TENORMIN) 50 MG tablet Take 1 tablet (50 mg total) by mouth 2 (two) times daily. Qty: 60 tablet, Refills: 11    levothyroxine (SYNTHROID, LEVOTHROID) 175 MCG tablet take 1 tablet by mouth once daily Qty: 90 tablet, Refills: 3    PARoxetine (PAXIL) 40 MG tablet Take 1 tablet (40 mg total) by mouth daily. Qty: 90  tablet, Refills: 3   Associated Diagnoses: Depression       No Known Allergies Follow-up Information    Tereso Newcomer, PA-C Follow up on 02/11/2016.   Specialties:  Cardiology, Physician Assistant Why:  at 11:15 am Contact information: [address] [place] [address] [phone]        Howard Pouch, MD. Schedule an appointment as soon as possible for a visit in 10 day(s).   Contact information: [address] [place] [address] [phone]           The results of significant diagnostics from this hospitalization (including imaging, microbiology, ancillary and laboratory) are listed below for reference.    Significant Diagnostic Studies: Dg Chest 2 View  Result Date: 02/05/2016 CLINICAL DATA:  Chest pain and shortness of breath for 1 week, worsening tonight. History of hypertension. EXAM: CHEST  2 VIEW COMPARISON:  Chest radiograph October 05, 2014 FINDINGS: Cardiomediastinal silhouette is normal. Strandy densities LEFT lung base. No pleural effusions or focal consolidations. Trachea projects midline and there is no pneumothorax. Soft tissue planes and included osseous structures are non-suspicious. Similar upper thoracic dextroscoliosis. Large body habitus. IMPRESSION: LEFT lung base atelectasis/scarring. Electronically Signed   By: Awilda Metro M.D.   On: 02/05/2016 01:31   Labs: Basic Metabolic Panel:  Recent Labs Lab 02/04/16 2244  NA 134*  K 3.8  CL 102  CO2 24  GLUCOSE 89  BUN 9  CREATININE 0.62  CALCIUM 9.3   CBC:  Recent Labs Lab 02/04/16 2244  WBC 7.6  HGB 13.7  HCT 40.4  MCV 83.0  PLT 318   Cardiac Enzymes:  Recent Labs Lab 02/05/16 0149 02/05/16 0604  TROPONINI <0.03 <0.03    Signed:  Vassie Loll MD.  [facility] 02/05/2016, 1:52 PM

## 2016-02-05 NOTE — H&P (Signed)
History and Physical    Cheryl Castro ZOX:[id number] DOB: 1950-07-23 DOA: 02/04/2016   PCP: Howard Pouch, MD Chief Complaint:  Chief Complaint  Patient presents with  . Chest Pain    HPI: Cheryl Castro is a 65 y.o. female with medical history significant of HTN and obesity.  Patient presents to the ED with c/o CP.  Pain is located in central chest, pressure quality.  No radiation.  Onset earlier this evening.  Associated SOB.  Worse when lying down.  ED Course: Trop negative, EKG unremarkable other than early repol.  Review of Systems: As per HPI otherwise 10 point review of systems negative.    Past Medical History:  Diagnosis Date  . Anxiety   . GERD (gastroesophageal reflux disease)   . Hiatal hernia   . Hypertension   . Sleep apnea   . Thyroid disease     History reviewed. No pertinent surgical history.   reports that she has quit smoking. She has never used smokeless tobacco. She reports that she does not drink alcohol or use drugs.  No Known Allergies  Family History  Problem Relation Age of Onset  . Diabetes Other   . Hypertension Other   . CAD Other   . Heart attack Father     died of MI age 50  . Coronary artery disease Brother       Prior to Admission medications   Medication Sig Start Date End Date Taking? Authorizing Provider  atenolol (TENORMIN) 50 MG tablet Take 1 tablet (50 mg total) by mouth 2 (two) times daily. 02/03/15  Yes Tyrone Nine, MD  levothyroxine (SYNTHROID, LEVOTHROID) 175 MCG tablet take 1 tablet by mouth once daily 07/24/15  Yes Tyrone Nine, MD  PARoxetine (PAXIL) 40 MG tablet Take 1 tablet (40 mg total) by mouth daily. 05/01/15  Yes Tyrone Nine, MD    Physical Exam: Vitals:   02/04/16 2239 02/05/16 0129  BP: 157/89 122/70  Pulse: 65 (!) 56  Resp: 26 19  Temp: 97.8 F (36.6 C)   TempSrc: Oral   SpO2: 99% 98%      Constitutional: NAD, calm, comfortable Eyes: PERRL, lids and conjunctivae normal ENMT: Mucous membranes  are moist. Posterior pharynx clear of any exudate or lesions.Normal dentition.  Neck: normal, supple, no masses, no thyromegaly Respiratory: clear to auscultation bilaterally, no wheezing, no crackles. Normal respiratory effort. No accessory muscle use.  Cardiovascular: Regular rate and rhythm, no murmurs / rubs / gallops. No extremity edema. 2+ pedal pulses. No carotid bruits.  Abdomen: no tenderness, no masses palpated. No hepatosplenomegaly. Bowel sounds positive.  Musculoskeletal: no clubbing / cyanosis. No joint deformity upper and lower extremities. Good ROM, no contractures. Normal muscle tone.  Skin: no rashes, lesions, ulcers. No induration Neurologic: CN 2-12 grossly intact. Sensation intact, DTR normal. Strength 5/5 in all 4.  Psychiatric: Normal judgment and insight. Alert and oriented x 3. Normal mood.    Labs on Admission: I have personally reviewed following labs and imaging studies  CBC:  Recent Labs Lab 02/04/16 2244  WBC 7.6  HGB 13.7  HCT 40.4  MCV 83.0  PLT 318   Basic Metabolic Panel:  Recent Labs Lab 02/04/16 2244  NA 134*  K 3.8  CL 102  CO2 24  GLUCOSE 89  BUN 9  CREATININE 0.62  CALCIUM 9.3   GFR: CrCl cannot be calculated (Unknown ideal weight.). Liver Function Tests: No results for input(s): AST, ALT, ALKPHOS, BILITOT, PROT, ALBUMIN  in the last 168 hours. No results for input(s): LIPASE, AMYLASE in the last 168 hours. No results for input(s): AMMONIA in the last 168 hours. Coagulation Profile: No results for input(s): INR, PROTIME in the last 168 hours. Cardiac Enzymes: No results for input(s): CKTOTAL, CKMB, CKMBINDEX, TROPONINI in the last 168 hours. BNP (last 3 results) No results for input(s): PROBNP in the last 8760 hours. HbA1C: No results for input(s): HGBA1C in the last 72 hours. CBG: No results for input(s): GLUCAP in the last 168 hours. Lipid Profile: No results for input(s): CHOL, HDL, LDLCALC, TRIG, CHOLHDL, LDLDIRECT in  the last 72 hours. Thyroid Function Tests: No results for input(s): TSH, T4TOTAL, FREET4, T3FREE, THYROIDAB in the last 72 hours. Anemia Panel: No results for input(s): VITAMINB12, FOLATE, FERRITIN, TIBC, IRON, RETICCTPCT in the last 72 hours. Urine analysis:    Component Value Date/Time   COLORURINE YELLOW 01/14/2015 2300   APPEARANCEUR CLEAR 01/14/2015 2300   LABSPEC 1.009 01/14/2015 2300   PHURINE 5.0 01/14/2015 2300   GLUCOSEU NEGATIVE 01/14/2015 2300   HGBUR SMALL (A) 01/14/2015 2300   HGBUR moderate 04/02/2010 0949   BILIRUBINUR NEGATIVE 01/14/2015 2300   KETONESUR NEGATIVE 01/14/2015 2300   PROTEINUR NEGATIVE 01/14/2015 2300   UROBILINOGEN 0.2 01/14/2015 2300   NITRITE NEGATIVE 01/14/2015 2300   LEUKOCYTESUR NEGATIVE 01/14/2015 2300   Sepsis Labs: @LABRCNTIP (procalcitonin:4,lacticidven:4) )No results found for this or any previous visit (from the past 240 hour(s)).   Radiological Exams on Admission: Dg Chest 2 View  Result Date: 02/05/2016 CLINICAL DATA:  Chest pain and shortness of breath for 1 week, worsening tonight. History of hypertension. EXAM: CHEST  2 VIEW COMPARISON:  Chest radiograph October 05, 2014 FINDINGS: Cardiomediastinal silhouette is normal. Strandy densities LEFT lung base. No pleural effusions or focal consolidations. Trachea projects midline and there is no pneumothorax. Soft tissue planes and included osseous structures are non-suspicious. Similar upper thoracic dextroscoliosis. Large body habitus. IMPRESSION: LEFT lung base atelectasis/scarring. Electronically Signed   By: Awilda Metroourtnay  Bloomer M.D.   On: 02/05/2016 01:31    EKG: Independently reviewed.  Assessment/Plan Active Problems:   Chest pain with low risk for cardiac etiology    1. CP - HEART score of 3 1. CP obs pathway 2. Serial trops 3. Tele monitor 4. Will keep NPO just in case   DVT prophylaxis: Lovenox Code Status: Full Family Communication: No family in room Consults called:  None Admission status: Place in 49obs   Antoinette Borgwardt, Heywood IlesJARED M. DO [facility] Pager [phone] from 7PM-7AM  If 7AM-7PM, please contact the day physician for the patient [email or web address] Password [email or web address]  02/05/2016, 2:13 AM

## 2016-02-05 NOTE — Plan of Care (Signed)
Problem: Safety: Goal: Ability to remain free from injury will improve Outcome: Completed/Met Date Met: 02/05/16 Gait remains steady. Pt out bed self  Problem: Physical Regulation: Goal: Ability to maintain clinical measurements within normal limits will improve Outcome: Adequate for Discharge Pt c/o discomfort in chest 3/10 continuously since admission

## 2016-02-05 NOTE — Progress Notes (Signed)
Pt education reviewed regarding DC. Questions, concerns denied. Pt a&ox4, ambulatory.

## 2016-02-05 NOTE — Plan of Care (Signed)
Problem: Education: Goal: Knowledge of [facility] Education information/materials will improve Outcome: Completed/Met Date Met: 02/05/16 Discussed current plan of care. Pt verbalized understanding    

## 2016-02-05 NOTE — ED Provider Notes (Addendum)
[facility]-EMERGENCY DEPT Provider Note   CSN: [id number] Arrival date & time: 02/04/16  2227  By signing my name below, I, [name], attest that this documentation has been prepared under the direction and in the presence of Derwood Kaplan, MD. Electronically Signed: [name], ED Scribe. 02/05/16. 12:21 AM.   History   Chief Complaint Chief Complaint  Patient presents with  . Chest Pain   The history is provided by the patient. No language interpreter was used.   HPI Comments: Cheryl Castro is a 65 y.o. female with PMHx of GERD and HTN who presents to the Emergency Department complaining of gradual onset, intermittent central chest pain onset 4 days. She describes pain as a non-radiating, pressure-like sensation. Pain does not radiate. Pain is exacerbated when laying flat. Pt states chest pain worsened tonight at 8 PM and became constant while she was seated at work. She states pain has decreased, but is still present. She reports associated shortness of breath, mild nausea, diaphoresis. Pt has no hx of CAD, CHF, HLD, DM, PE, DVT, Cancer. Denies recent long distance travel. Pt does not smoke. Pt has FHx of MI, Father at 44.  Past Medical History:  Diagnosis Date  . Anxiety   . GERD (gastroesophageal reflux disease)   . Hiatal hernia   . Hypertension   . Sleep apnea   . Thyroid disease     Patient Active Problem List   Diagnosis Date Noted  . Pain in joint, ankle and foot 05/01/2015  . Healthcare maintenance 10/09/2014  . Palpitations 10/09/2014  . Bipolar II disorder (HCC) 01/30/2013  . Generalized anxiety disorder 01/02/2013  . Reflux gastritis 11/22/2011  . Hypothyroidism 04/10/2010  . PELVIC PAIN, CHRONIC 04/02/2010  . Obesity 02/25/2010  . Depression 12/30/2009  . Pruritus of skin 07/28/2009  . BACK PAIN, CHRONIC 07/28/2009  . Essential hypertension, benign 06/24/2009    History reviewed. No pertinent surgical history.  OB History    No data available       Home Medications    Prior to Admission medications   Medication Sig Start Date End Date Taking? Authorizing Provider  atenolol (TENORMIN) 50 MG tablet Take 1 tablet (50 mg total) by mouth 2 (two) times daily. 02/03/15  Yes Tyrone Nine, MD  levothyroxine (SYNTHROID, LEVOTHROID) 175 MCG tablet take 1 tablet by mouth once daily 07/24/15  Yes Tyrone Nine, MD  PARoxetine (PAXIL) 40 MG tablet Take 1 tablet (40 mg total) by mouth daily. 05/01/15  Yes Tyrone Nine, MD  ranitidine (ZANTAC) 150 MG capsule Take 1 capsule (150 mg total) by mouth every evening. Patient not taking: Reported on 02/05/2016 05/01/15 04/30/16  Tyrone Nine, MD    Family History Family History  Problem Relation Age of Onset  . Diabetes Other   . Hypertension Other   . CAD Other     Social History Social History  Substance Use Topics  . Smoking status: Former Games developer  . Smokeless tobacco: Never Used  . Alcohol use No    Allergies   Review of patient's allergies indicates no known allergies.  Review of Systems Review of Systems A complete 10 system review of systems was obtained and all systems are negative except as noted in the HPI and PMH.    Physical Exam Updated Vital Signs BP 122/70   Pulse (!) 56   Temp 97.8 F (36.6 C) (Oral)   Resp 19   SpO2 98%   Physical Exam  Constitutional: She is  oriented to person, place, and time. She appears well-developed and well-nourished. No distress.  HENT:  Head: Normocephalic and atraumatic.  Eyes: EOM are normal.  Neck: Normal range of motion.  Cardiovascular: Normal rate, regular rhythm and normal heart sounds.   Pulmonary/Chest: Effort normal and breath sounds normal. No respiratory distress. She has no wheezes. She has no rales.  Lungs are clear to ascultation  Abdominal: Soft. She exhibits no distension. Mass: No palpable mass. There is no tenderness.  Musculoskeletal: Normal range of motion.  No calf tenderness  Neurological: She is alert and  oriented to person, place, and time.  Skin: Skin is warm and dry.  No pitting edema No unilateral swelling  Psychiatric: She has a normal mood and affect. Judgment normal.  Nursing note and vitals reviewed.  ED Treatments / Results  DIAGNOSTIC STUDIES: Oxygen Saturation is 99% on RA, normal by my interpretation.    COORDINATION OF CARE: 12:20 AM Discussed treatment plan with pt at bedside which includes Aspirin and Nitroglycerin and pt agreed to plan.  Labs (all labs ordered are listed, but only abnormal results are displayed) Labs Reviewed  BASIC METABOLIC PANEL - Abnormal; Notable for the following:       Result Value   Sodium 134 (*)    All other components within normal limits  CBC  I-STAT TROPOININ, ED    EKG  EKG Interpretation  Date/Time:  Wednesday February 04 2016 22:37:56 EDT Ventricular Rate:  61 PR Interval:    QRS Duration: 81 QT Interval:  447 QTC Calculation: 451 R Axis:   56 Text Interpretation:  Sinus rhythm Minimal ST elevation, inferior leads Baseline wander in lead(s) V2 No STEMI. Similar to prior.  Confirmed by LONG MD, JOSHUA [phone]) on 02/04/2016 10:49:11 PM      Radiology No results found.  Procedures Procedures (including critical care time)  Medications Ordered in ED Medications  nitroGLYCERIN (NITROSTAT) SL tablet 0.4 mg (0.4 mg Sublingual Given 02/05/16 0050)  aspirin chewable tablet 324 mg (324 mg Oral Given 02/05/16 0049)     Initial Impression / Assessment and Plan / ED Course  I have reviewed the triage vital signs and the nursing notes.  Pertinent labs & imaging results that were available during my care of the patient were reviewed by me and considered in my medical decision making (see chart for details).  Clinical Course  Comment By Time  Patient reassessed. Pt is comfortable at this time.  Results of the workup discussed. Pt received 1 nitro in the ER and the pain resolved completely. HEAR score is 5, we will admit. Derwood Kaplan, MD 10/05 [phone]  Hospitalist unsure if patient needs admission. I explained the HEAR score is 5 (1 for hx, 1 for age, 1 got ekg and 2 for risk factors - which are obesity, HTN, family hx). Dr. Julian Reil will evaluate the patient independently and decide on disposition. I did inform him that from the ED side, I am not comfortable discharging the patient with HEAR score of 5 and no specific etiology for the atypical chest pain. Derwood Kaplan, MD 10/05 0131   I personally performed the services described in this documentation, which was scribed in my presence. The recorded information has been reviewed and is accurate.  Pt comes in with cc of chest pain. Chest pain is mid-sternal, pressure/heaviness type x 4 days - getting worse. HEAR score is 5. No CAD hx. Pt has no PE risk factors. Pain not consistent with pericarditis / PUD.  Final Clinical Impressions(s) / ED Diagnoses   Final diagnoses:  Angina pectoris [facility](HCC)    New Prescriptions New Prescriptions   No medications on file     Derwood KaplanAnkit Kenner Lewan, MD 02/05/16 0109    Derwood KaplanAnkit Nathalya Wolanski, MD 02/05/16 [phone]

## 2016-02-06 LAB — HEMOGLOBIN A1C
HEMOGLOBIN A1C: 6.1 % — AB (ref 4.8–5.6)
MEAN PLASMA GLUCOSE: 128 mg/dL

## 2016-02-11 ENCOUNTER — Encounter: Payer: Self-pay | Admitting: Physician Assistant

## 2016-02-11 ENCOUNTER — Ambulatory Visit: Payer: Self-pay | Admitting: Physician Assistant

## 2016-02-16 ENCOUNTER — Ambulatory Visit (INDEPENDENT_AMBULATORY_CARE_PROVIDER_SITE_OTHER): Payer: Self-pay | Admitting: Internal Medicine

## 2016-02-16 VITALS — BP 153/78 | HR 67 | Temp 98.4°F | Wt 265.0 lb

## 2016-02-16 DIAGNOSIS — R202 Paresthesia of skin: Secondary | ICD-10-CM

## 2016-02-16 DIAGNOSIS — R200 Anesthesia of skin: Secondary | ICD-10-CM

## 2016-02-16 NOTE — Progress Notes (Signed)
   [facility] Cheryl CharsAsiyah Clarrisa Kaylor, MD Phone: [phone]  Reason For Visit: SDA for episode of numbness   # Patient presenting with hx of an episode of  numbness in left distal arm lasted for about twenty minutes 3 days ago. Patient massaged the arm and she felt circulation return. She notes with return of circulation with her palm becoming flushed.  She currently states she has a little tinglingi in her finger tips Denies any pain in arm, shoulder or neck. Patient denies having chest pain, SOB, n/v or diaphoresis at the timeof this event. Patient states she does not remember resting on her shoulder or sitting an position that could have resulted in compression. Patient denies any weakness or other neurologic changes during episode of numbness. Patient denies any hx of cancer, no recent hx of weight loss, or cough   Past Medical History Reviewed problem list.  Medications- reviewed and updated No additions to family history Social history- patient is a non smoker  Objective: BP (!) 153/78 (BP Location: Left Arm, Patient Position: Sitting, Cuff Size: Normal)   Pulse 67   Temp 98.4 F (36.9 C) (Oral)   Wt 265 lb (120.2 kg)   SpO2 100%   BMI 44.10 kg/m  Gen: NAD, alert, cooperative with exam Hematologic: Negative for cervical, supraclavicular, or axillary lymphadenopathy  Cardio: regular rate and rhythm, S1S2 heard, no murmurs appreciated Pulm: clear to auscultation bilaterally, no wheezes, rhonchi or rales Neck: no pain with palpation of cervical processes, no pain on palpation of neck  Upper extremities: No abnormalities noted on exam left/right arm, no pain/numbness to palpation of arm. 5/5 strength in upper extremities, radial pulses 2+   Assessment/Plan: See problem based a/p   Numbness and tingling in left arm 20 mins of numbness and tingling in distal arm about 3 days ago with little residual effect other than tingling in distal fingertips. Diff:    Compressive, Circulatory (blood clot (unlikely) given the quick resolution, obstructive (thoracic outlet syndrome) though unlikely patient would 2+ radial pulse, TIA (unlikely due to the lack of any motor changes, thought patient with hx of HTN increasing risk), Cardiac (unlikely due to lack of other heart systems during event). Unsure of etiology of numbness, however currently no concern for significantly malignant process, discussed with Dr. Deirdre Priesthambliss  - Follow up for this if return of the symptoms

## 2016-02-17 DIAGNOSIS — R200 Anesthesia of skin: Secondary | ICD-10-CM | POA: Insufficient documentation

## 2016-02-17 DIAGNOSIS — R202 Paresthesia of skin: Principal | ICD-10-CM

## 2016-02-17 NOTE — Assessment & Plan Note (Signed)
20 mins of numbness and tingling in distal arm about 3 days ago with little residual effect other than tingling in distal fingertips. Diff:  Compressive, Circulatory (blood clot (unlikely) given the quick resolution, obstructive (thoracic outlet syndrome) though unlikely patient would 2+ radial pulse, TIA (unlikely due to the lack of any motor changes, thought patient with hx of HTN increasing risk), Cardiac (unlikely due to lack of other heart systems during event). Unsure of etiology of numbness, however currently no concern for significantly malignant process, discussed with Dr. Deirdre Priesthambliss  - Follow up for this if return of the symptoms

## 2016-02-20 ENCOUNTER — Other Ambulatory Visit: Payer: Self-pay | Admitting: Student in an Organized Health Care Education/Training Program

## 2016-02-20 MED ORDER — ATENOLOL 50 MG PO TABS: 50.0000 mg | ORAL_TABLET | Freq: Two times a day (BID) | ORAL | 11 refills | Status: DC

## 2016-02-20 NOTE — Telephone Encounter (Signed)
Needs refill on atenolol. Has 3 pills left. [facility]

## 2016-02-24 ENCOUNTER — Encounter: Payer: Self-pay | Admitting: Physician Assistant

## 2016-02-24 ENCOUNTER — Ambulatory Visit (INDEPENDENT_AMBULATORY_CARE_PROVIDER_SITE_OTHER): Payer: Self-pay | Admitting: Physician Assistant

## 2016-02-24 ENCOUNTER — Encounter: Payer: Self-pay | Admitting: *Deleted

## 2016-02-24 VITALS — BP 120/78 | HR 63 | Ht 65.0 in | Wt 262.4 lb

## 2016-02-24 DIAGNOSIS — I10 Essential (primary) hypertension: Secondary | ICD-10-CM

## 2016-02-24 DIAGNOSIS — I208 Other forms of angina pectoris: Secondary | ICD-10-CM

## 2016-02-24 DIAGNOSIS — K219 Gastro-esophageal reflux disease without esophagitis: Secondary | ICD-10-CM

## 2016-02-24 DIAGNOSIS — R011 Cardiac murmur, unspecified: Secondary | ICD-10-CM

## 2016-02-24 LAB — CBC WITH DIFFERENTIAL/PLATELET
BASOS PCT: 0 %
Basophils Absolute: 0 cells/uL (ref 0–200)
EOS PCT: 4 %
Eosinophils Absolute: 244 cells/uL (ref 15–500)
HCT: 42 % (ref 35.0–45.0)
HEMOGLOBIN: 13.9 g/dL (ref 11.7–15.5)
LYMPHS ABS: 2196 {cells}/uL (ref 850–3900)
Lymphocytes Relative: 36 %
MCH: 28.3 pg (ref 27.0–33.0)
MCHC: 33.1 g/dL (ref 32.0–36.0)
MCV: 85.4 fL (ref 80.0–100.0)
MONOS PCT: 7 %
MPV: 9.5 fL (ref 7.5–12.5)
Monocytes Absolute: 427 cells/uL (ref 200–950)
NEUTROS ABS: 3233 {cells}/uL (ref 1500–7800)
Neutrophils Relative %: 53 %
PLATELETS: 348 10*3/uL (ref 140–400)
RBC: 4.92 MIL/uL (ref 3.80–5.10)
RDW: 13.9 % (ref 11.0–15.0)
WBC: 6.1 10*3/uL (ref 3.8–10.8)

## 2016-02-24 LAB — BASIC METABOLIC PANEL
BUN: 14 mg/dL (ref 7–25)
CALCIUM: 9.4 mg/dL (ref 8.6–10.4)
CO2: 28 mmol/L (ref 20–31)
Chloride: 100 mmol/L (ref 98–110)
Creat: 0.74 mg/dL (ref 0.50–0.99)
Glucose, Bld: 80 mg/dL (ref 65–99)
Potassium: 4.2 mmol/L (ref 3.5–5.3)
SODIUM: 137 mmol/L (ref 135–146)

## 2016-02-24 MED ORDER — ASPIRIN EC 81 MG PO TBEC: 81.0000 mg | DELAYED_RELEASE_TABLET | Freq: Every day | ORAL | Status: DC

## 2016-02-24 MED ORDER — NITROGLYCERIN 0.4 MG SL SUBL: 0.4000 mg | SUBLINGUAL_TABLET | SUBLINGUAL | 3 refills | Status: AC | PRN

## 2016-02-24 NOTE — Patient Instructions (Addendum)
Medication Instructions:  Your physician has recommended you make the following change in your medication:  1. START ASPIRIN 81 MG DAILY 2. YOU HAVE BEEN GIVEN AN RX FOR NITROGLYCERIN AND HAVE BEEN ADVISED AS TO HOW AND WHEN TO USE NTG  Labwork: TODAY BMET, CBC W/DIFF, PT/INR  Testing/Procedures: Your physician has requested that you have a cardiac catheterization. Cardiac catheterization is used to diagnose and/or treat various heart conditions. Doctors may recommend this procedure for a number of different reasons. The most common reason is to evaluate chest pain. Chest pain can be a symptom of coronary artery disease (CAD), and cardiac catheterization can show whether plaque is narrowing or blocking your heart's arteries. This procedure is also used to evaluate the valves, as well as measure the blood flow and oxygen levels in different parts of your heart. For further information please visit [email or web address] Please follow instruction sheet, as given.  Your physician has requested that you have an echocardiogram. Echocardiography is a painless test that uses sound waves to create images of your heart. It provides your doctor with information about the size and shape of your heart and how well your heart's chambers and valves are working. This procedure takes approximately one hour. There are no restrictions for this procedure.  Follow-Up: Tereso NewcomerSCOTT WEAVER, [facility] 03/15/16 @ 9:15 POST PROCEDURE FOLLOW UP   Any Other Special Instructions Will Be Listed Below (If Applicable).  If you need a refill on your cardiac medications before your next appointment, please call your pharmacy.

## 2016-02-24 NOTE — Progress Notes (Signed)
Cardiology Office Note:    Date:  02/24/2016   ID:  Cheryl Castro, DOB 1950/07/07, MRN [id number]  PCP:  Howard Pouch, MD  Cardiologist:  Dr. Delane Ginger (new)  Electrophysiologist:  n/a  Referring MD: Howard Pouch, MD   Chief Complaint  Patient presents with  . Hospitalization Follow-up    admitted with chest pain    History of Present Illness:    Cheryl Castro is a 65 y.o. female with a hx of HTN, obesity, anxiety, GERD/hiatal hernia, hypothyroidism.  Admitted 10/4-10/5 with chest pain.  She r/o for MI and the DC summary indicates she was to have a FU outpatient stress test.  Cardiac enzymes were neg.  CXR showed L base atelectasis.  ECG showed NSR and no ST changes.    She presents for evaluation today.  She has a long hx of chest pain that has largely been felt to be related to GERD/hiatal hernia.  Over the last several months, she has noted a change in her symptoms. She notes substernal pressure that can come on with mild to mod exertion.  She had assoc dyspnea, nausea and diaphoresis with her symptoms that brought her to the ED. She was given NTG without relief. Her symptoms went on for a few hours.  She has not seen a gastroenterologist in years.  She has had several endoscopies in the past.  She gets a different type of chest discomfort with meals.  She denies orthopnea, PND, edema.  She denies syncope.  PAD Screen 02/24/2016  Previous PAD dx? No  Previous surgical procedure? No  Pain with walking? Yes  Subsides with rest? Yes  Feet/toe relief with dangling? No  Painful, non-healing ulcers? No  Extremities discolored? Yes     Prior CV studies that were reviewed today include:    None   Past Medical History:  Diagnosis Date  . Anxiety   . GERD (gastroesophageal reflux disease)   . Glucose intolerance (impaired glucose tolerance)   . Hiatal hernia   . Hypertension   . Hypothyroidism   . Sleep apnea    no longer using CPAP    History reviewed. No pertinent  surgical history.  Current Medications: Current Meds  Medication Sig  . atenolol (TENORMIN) 50 MG tablet Take 1 tablet (50 mg total) by mouth 2 (two) times daily.  Marland Kitchen levothyroxine (SYNTHROID, LEVOTHROID) 175 MCG tablet take 1 tablet by mouth once daily  . pantoprazole (PROTONIX) 40 MG tablet Take 40 mg by mouth 2 (two) times daily as needed (acid reflux).  Marland Kitchen PARoxetine (PAXIL) 40 MG tablet Take 1 tablet (40 mg total) by mouth daily.     Allergies:   Review of patient's allergies indicates no known allergies.   Social History   Social History  . Marital status: Married    Spouse name: N/A  . Number of children: N/A  . Years of education: N/A   Social History Main Topics  . Smoking status: Former Games developer  . Smokeless tobacco: Never Used  . Alcohol use No  . Drug use: No  . Sexual activity: Not Asked   Other Topics Concern  . 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   ROS:   Please see the history of present illness.    Review of Systems  Constitution: Positive for diaphoresis and malaise/fatigue.  HENT: Positive for headaches.   Cardiovascular: Positive for chest pain, dyspnea on exertion and irregular heartbeat.  Respiratory: Positive for snoring.   Musculoskeletal: Positive for back pain, joint swelling and myalgias.  Gastrointestinal: Positive for abdominal pain and constipation.  Neurological: Positive for loss of balance.  Psychiatric/Behavioral: The patient is nervous/anxious.    All other systems reviewed and are negative.   EKGs/Labs/Other Test Reviewed:    EKG:  EKG is  ordered today.  The ekg ordered today demonstrates NSR, HR 63, normal axis, QTc 462  Recent Labs: 02/04/2016: BUN 9;  Creatinine, Ser 0.62; Hemoglobin 13.7; Platelets 318; Potassium 3.8; Sodium 134 02/05/2016: TSH 0.229   Recent Lipid Panel    Component Value Date/Time   CHOL 162 02/05/2016 0743   TRIG 92 02/05/2016 0743   HDL 39 (L) 02/05/2016 0743   CHOLHDL 4.2 02/05/2016 0743   VLDL 18 02/05/2016 0743   LDLCALC 105 (H) 02/05/2016 0743   LDLDIRECT 113 (H) 11/22/2011 1703     Physical Exam:    VS:  BP 120/78 (BP Location: Left Arm, Cuff Size: Normal)   Pulse 63   Ht 5\' 5"  (1.651 m)   Wt 262 lb 6.4 oz (119 kg)   BMI 43.67 kg/m     Wt Readings from Last 3 Encounters:  02/24/16 262 lb 6.4 oz (119 kg)  02/16/16 265 lb (120.2 kg)  02/05/16 260 lb (117.9 kg)     Physical Exam  Constitutional: She is oriented to person, place, and time. She appears well-developed and well-nourished. No distress.  HENT:  Head: Normocephalic and atraumatic.  Eyes: No scleral icterus.  Neck: No JVD present.  Cardiovascular: Normal rate, regular rhythm, S1 normal and S2 normal.   Murmur heard.  Medium-pitched systolic murmur is present with a grade of 2/6  at the upper right sternal border Pulmonary/Chest: Effort normal. She has no wheezes. She has no rales.  Abdominal: Soft. There is no tenderness.  Musculoskeletal: She exhibits no edema.  Neurological: She is alert and oriented to person, place, and time.  Skin: Skin is warm and dry.  Psychiatric: She has a normal mood and affect.    ASSESSMENT:    1. Exertional angina (HCC)   2. Murmur   3. Essential hypertension   4. Gastroesophageal reflux disease without esophagitis    PLAN:    In order of problems listed above:  1. Chest pain - She has risk factors of age, FHx CAD, HTN, glucose intol and prior smoking.  She had a neg heart cath years ago.  But, I cannot pull up a report. So, it must have been > 10 years.  She has a lot of exertional symptoms that are different from her GERD.  She likely describes CCS Class 3 angina.  Stress testing may be  problematic given her size and nuclear images may not be interpretable.  Her symptoms seem to be progressive over the past few months.  I reviewed with Dr. Delane Ginger who also saw the patient today.  We have recommended proceeding with cardiac catheterization to further evaluate.  Risks and benefits of cardiac catheterization have been discussed with the patient.  These include bleeding, infection, kidney damage, stroke, heart attack, death.  The patient understands these risks and is willing to proceed.   -  Start ASA, prn NTG  -  Continue beta-blocker.  -  LHC later this week.  2. Murmur - Obtain an Echocardiogram.  3. HTN - BP controlled.  4. GERD - Continue PPI.    Medication Adjustments/Labs and Tests Ordered: Current medicines are reviewed at length with the patient today.  Concerns regarding medicines are outlined above.  Medication changes, Labs and Tests ordered today are outlined in the Patient Instructions noted below. Patient Instructions  Medication Instructions:  Your physician has recommended you make the following change in your medication:  1. START ASPIRIN 81 MG DAILY 2. YOU HAVE BEEN GIVEN AN RX FOR NITROGLYCERIN AND HAVE BEEN ADVISED AS TO HOW AND WHEN TO USE NTG  Labwork: TODAY BMET, CBC W/DIFF, PT/INR  Testing/Procedures: Your physician has requested that you have a cardiac catheterization. Cardiac catheterization is used to diagnose and/or treat various heart conditions. Doctors may recommend this procedure for a number of different reasons. The most common reason is to evaluate chest pain. Chest pain can be a symptom of coronary artery disease (CAD), and cardiac catheterization can show whether plaque is narrowing or blocking your heart's arteries. This procedure is also used to evaluate the valves, as well as measure the blood flow and oxygen levels in different parts of your heart. For further information please visit [email or web address] Please follow instruction  sheet, as given.  Your physician has requested that you have an echocardiogram. Echocardiography is a painless test that uses sound waves to create images of your heart. It provides your doctor with information about the size and shape of your heart and how well your heart's chambers and valves are working. This procedure takes approximately one hour. There are no restrictions for this procedure.  Follow-Up: Tereso Newcomer, [facility] 03/15/16 @ 9:15 POST PROCEDURE FOLLOW UP   Any Other Special Instructions Will Be Listed Below (If Applicable).  If you need a refill on your cardiac medications before your next appointment, please call your pharmacy.  Signed, Tereso Newcomer, PA-C  02/24/2016 5:16 PM    [facility] [address] [place], [place], [place]  [address] Phone: [phone]; Fax: [phone]

## 2016-02-25 ENCOUNTER — Telehealth: Payer: Self-pay | Admitting: *Deleted

## 2016-02-25 LAB — PROTIME-INR
INR: 1
PROTHROMBIN TIME: 10.7 s (ref 9.0–11.5)

## 2016-02-25 NOTE — Telephone Encounter (Signed)
Lmtcb to go over lab results 

## 2016-02-26 ENCOUNTER — Telehealth: Payer: Self-pay | Admitting: Physician Assistant

## 2016-02-26 NOTE — Telephone Encounter (Signed)
F/u Message ° °Pt returning RN call about lab results. Please call back to discuss  °

## 2016-02-26 NOTE — Telephone Encounter (Signed)
Informed pt of lab results. Pt verbalized understanding. 

## 2016-02-27 ENCOUNTER — Encounter (HOSPITAL_COMMUNITY)
Admission: AD | Disposition: A | Discharge status: Home / Self Care | Payer: Self-pay | Source: Ambulatory Visit | Attending: Interventional Cardiology

## 2016-02-27 ENCOUNTER — Ambulatory Visit (HOSPITAL_COMMUNITY)
Admission: AD | Admit: 2016-02-27 | Discharge: 2016-02-27 | Disposition: A | Discharge status: Home / Self Care | Payer: Self-pay | Source: Ambulatory Visit | Attending: Interventional Cardiology | Admitting: Interventional Cardiology

## 2016-02-27 ENCOUNTER — Encounter (HOSPITAL_COMMUNITY): Payer: Self-pay | Admitting: Interventional Cardiology

## 2016-02-27 DIAGNOSIS — K449 Diaphragmatic hernia without obstruction or gangrene: Secondary | ICD-10-CM | POA: Insufficient documentation

## 2016-02-27 DIAGNOSIS — Z833 Family history of diabetes mellitus: Secondary | ICD-10-CM | POA: Insufficient documentation

## 2016-02-27 DIAGNOSIS — I208 Other forms of angina pectoris: Secondary | ICD-10-CM

## 2016-02-27 DIAGNOSIS — F419 Anxiety disorder, unspecified: Secondary | ICD-10-CM | POA: Insufficient documentation

## 2016-02-27 DIAGNOSIS — R011 Cardiac murmur, unspecified: Secondary | ICD-10-CM | POA: Insufficient documentation

## 2016-02-27 DIAGNOSIS — Z87891 Personal history of nicotine dependence: Secondary | ICD-10-CM | POA: Insufficient documentation

## 2016-02-27 DIAGNOSIS — R7302 Impaired glucose tolerance (oral): Secondary | ICD-10-CM | POA: Insufficient documentation

## 2016-02-27 DIAGNOSIS — E039 Hypothyroidism, unspecified: Secondary | ICD-10-CM | POA: Insufficient documentation

## 2016-02-27 DIAGNOSIS — I2089 Other forms of angina pectoris: Secondary | ICD-10-CM

## 2016-02-27 DIAGNOSIS — Z6841 Body Mass Index (BMI) 40.0 and over, adult: Secondary | ICD-10-CM | POA: Insufficient documentation

## 2016-02-27 DIAGNOSIS — K219 Gastro-esophageal reflux disease without esophagitis: Secondary | ICD-10-CM | POA: Insufficient documentation

## 2016-02-27 DIAGNOSIS — Z8249 Family history of ischemic heart disease and other diseases of the circulatory system: Secondary | ICD-10-CM | POA: Insufficient documentation

## 2016-02-27 DIAGNOSIS — G4730 Sleep apnea, unspecified: Secondary | ICD-10-CM | POA: Insufficient documentation

## 2016-02-27 DIAGNOSIS — E669 Obesity, unspecified: Secondary | ICD-10-CM | POA: Insufficient documentation

## 2016-02-27 DIAGNOSIS — I10 Essential (primary) hypertension: Secondary | ICD-10-CM | POA: Insufficient documentation

## 2016-02-27 DIAGNOSIS — I25118 Atherosclerotic heart disease of native coronary artery with other forms of angina pectoris: Secondary | ICD-10-CM | POA: Insufficient documentation

## 2016-02-27 HISTORY — PX: CARDIAC CATHETERIZATION: SHX172

## 2016-02-27 SURGERY — LEFT HEART CATH AND CORONARY ANGIOGRAPHY
Anesthesia: LOCAL

## 2016-02-27 MED ORDER — LIDOCAINE HCL (PF) 1 % IJ SOLN
INTRAMUSCULAR | Status: DC | PRN
  Administered 2016-02-27: 2 mL

## 2016-02-27 MED ORDER — SODIUM CHLORIDE 0.9% FLUSH: 3.0000 mL | INTRAVENOUS | Status: DC | PRN

## 2016-02-27 MED ORDER — FENTANYL CITRATE (PF) 100 MCG/2ML IJ SOLN
INTRAMUSCULAR | Status: AC
  Filled 2016-02-27: qty 2

## 2016-02-27 MED ORDER — MIDAZOLAM HCL 2 MG/2ML IJ SOLN
INTRAMUSCULAR | Status: DC | PRN
  Administered 2016-02-27: 1 mg via INTRAVENOUS
  Administered 2016-02-27: 2 mg via INTRAVENOUS

## 2016-02-27 MED ORDER — IOPAMIDOL (ISOVUE-370) INJECTION 76%
INTRAVENOUS | Status: AC
  Filled 2016-02-27: qty 100

## 2016-02-27 MED ORDER — HEPARIN SODIUM (PORCINE) 1000 UNIT/ML IJ SOLN
INTRAMUSCULAR | Status: AC
  Filled 2016-02-27: qty 1

## 2016-02-27 MED ORDER — SODIUM CHLORIDE 0.9% FLUSH: 3.0000 mL | Freq: Two times a day (BID) | INTRAVENOUS | Status: DC

## 2016-02-27 MED ORDER — HEPARIN (PORCINE) IN NACL 2-0.9 UNIT/ML-% IJ SOLN
INTRAMUSCULAR | Status: AC
  Filled 2016-02-27: qty 1000

## 2016-02-27 MED ORDER — HEPARIN SODIUM (PORCINE) 1000 UNIT/ML IJ SOLN
INTRAMUSCULAR | Status: DC | PRN
  Administered 2016-02-27: 5000 [IU] via INTRAVENOUS

## 2016-02-27 MED ORDER — SODIUM CHLORIDE 0.9 % WEIGHT BASED INFUSION
3.0000 mL/kg/h | INTRAVENOUS | Status: AC
  Administered 2016-02-27: 3 mL/kg/h via INTRAVENOUS

## 2016-02-27 MED ORDER — VERAPAMIL HCL 2.5 MG/ML IV SOLN
INTRAVENOUS | Status: DC | PRN
  Administered 2016-02-27 (×2): 10 mL via INTRA_ARTERIAL

## 2016-02-27 MED ORDER — SODIUM CHLORIDE 0.9 % WEIGHT BASED INFUSION: 1.0000 mL/kg/h | INTRAVENOUS | Status: DC

## 2016-02-27 MED ORDER — ASPIRIN 81 MG PO CHEW
81.0000 mg | CHEWABLE_TABLET | ORAL | Status: AC
  Administered 2016-02-27: 81 mg via ORAL

## 2016-02-27 MED ORDER — MIDAZOLAM HCL 2 MG/2ML IJ SOLN
INTRAMUSCULAR | Status: AC
  Filled 2016-02-27: qty 2

## 2016-02-27 MED ORDER — LIDOCAINE HCL (PF) 1 % IJ SOLN
INTRAMUSCULAR | Status: AC
  Filled 2016-02-27: qty 30

## 2016-02-27 MED ORDER — ASPIRIN 81 MG PO CHEW
CHEWABLE_TABLET | ORAL | Status: AC
  Filled 2016-02-27: qty 1

## 2016-02-27 MED ORDER — HEPARIN (PORCINE) IN NACL 2-0.9 UNIT/ML-% IJ SOLN
INTRAMUSCULAR | Status: DC | PRN
  Administered 2016-02-27: 1000 mL

## 2016-02-27 MED ORDER — VERAPAMIL HCL 2.5 MG/ML IV SOLN
INTRAVENOUS | Status: AC
  Filled 2016-02-27: qty 2

## 2016-02-27 MED ORDER — IOPAMIDOL (ISOVUE-370) INJECTION 76%
INTRAVENOUS | Status: DC | PRN
  Administered 2016-02-27: 40 mL via INTRAVENOUS

## 2016-02-27 MED ORDER — FENTANYL CITRATE (PF) 100 MCG/2ML IJ SOLN
INTRAMUSCULAR | Status: DC | PRN
  Administered 2016-02-27 (×2): 25 ug via INTRAVENOUS

## 2016-02-27 MED ORDER — SODIUM CHLORIDE 0.9 % IV SOLN: 250.0000 mL | INTRAVENOUS | Status: DC | PRN

## 2016-02-27 SURGICAL SUPPLY — 15 items
CATH INFINITI 5 FR 3DRC (CATHETERS) ×1 IMPLANT
CATH INFINITI 5 FR JL3.5 (CATHETERS) ×1 IMPLANT
CATH INFINITI 5FR ANG PIGTAIL (CATHETERS) ×1 IMPLANT
CATH INFINITI JR4 5F (CATHETERS) ×1 IMPLANT
COVER PRB 48X5XTLSCP FOLD TPE (BAG) IMPLANT
COVER PROBE 5X48 (BAG)
DEVICE RAD COMP TR BAND LRG (VASCULAR PRODUCTS) ×1 IMPLANT
GLIDESHEATH SLEND SS 6F .021 (SHEATH) ×1 IMPLANT
KIT HEART LEFT (KITS) ×2 IMPLANT
PACK CARDIAC CATHETERIZATION (CUSTOM PROCEDURE TRAY) ×2 IMPLANT
SYR MEDRAD MARK V 150ML (SYRINGE) ×2 IMPLANT
TRANSDUCER W/STOPCOCK (MISCELLANEOUS) ×2 IMPLANT
TUBING CIL FLEX 10 FLL-RA (TUBING) ×2 IMPLANT
WIRE HI TORQ VERSACORE-J 145CM (WIRE) ×1 IMPLANT
WIRE SAFE-T 1.5MM-J .035X260CM (WIRE) ×1 IMPLANT

## 2016-02-27 NOTE — Progress Notes (Signed)
Pt to procedure room for heart cath

## 2016-02-27 NOTE — Research (Signed)
CADLAD Informed Consent   Subject Name: Cheryl Castro  Subject met inclusion and exclusion criteria.  The informed consent form, study requirements and expectations were reviewed with the subject and questions and concerns were addressed prior to the signing of the consent form.  The subject verbalized understanding of the trail requirements.  The subject agreed to participate in the CADLAD trial and signed the informed consent.  The informed consent was obtained prior to performance of any protocol-specific procedures for the subject.  A copy of the signed informed consent was given to the subject and a copy was placed in the subject's medical record.  Mable Fill, Marissa Nestle 02/27/2016, 7:16am

## 2016-02-27 NOTE — Progress Notes (Signed)
Pt return to holding area  Waiting for for procedure .  Pt on monitor , IVF infusing and denies any complaint of pain.

## 2016-02-27 NOTE — H&P (View-Only) (Signed)
Cardiology Office Note:    Date:  02/24/2016   ID:  Cheryl Castro, DOB 1950/07/07, MRN [id number]  PCP:  Howard Pouch, MD  Cardiologist:  Dr. Delane Ginger (new)  Electrophysiologist:  n/a  Referring MD: Howard Pouch, MD   Chief Complaint  Patient presents with  . Hospitalization Follow-up    admitted with chest pain    History of Present Illness:    Cheryl Castro is a 65 y.o. female with a hx of HTN, obesity, anxiety, GERD/hiatal hernia, hypothyroidism.  Admitted 10/4-10/5 with chest pain.  She r/o for MI and the DC summary indicates she was to have a FU outpatient stress test.  Cardiac enzymes were neg.  CXR showed L base atelectasis.  ECG showed NSR and no ST changes.    She presents for evaluation today.  She has a long hx of chest pain that has largely been felt to be related to GERD/hiatal hernia.  Over the last several months, she has noted a change in her symptoms. She notes substernal pressure that can come on with mild to mod exertion.  She had assoc dyspnea, nausea and diaphoresis with her symptoms that brought her to the ED. She was given NTG without relief. Her symptoms went on for a few hours.  She has not seen a gastroenterologist in years.  She has had several endoscopies in the past.  She gets a different type of chest discomfort with meals.  She denies orthopnea, PND, edema.  She denies syncope.  PAD Screen 02/24/2016  Previous PAD dx? No  Previous surgical procedure? No  Pain with walking? Yes  Subsides with rest? Yes  Feet/toe relief with dangling? No  Painful, non-healing ulcers? No  Extremities discolored? Yes     Prior CV studies that were reviewed today include:    None   Past Medical History:  Diagnosis Date  . Anxiety   . GERD (gastroesophageal reflux disease)   . Glucose intolerance (impaired glucose tolerance)   . Hiatal hernia   . Hypertension   . Hypothyroidism   . Sleep apnea    no longer using CPAP    History reviewed. No pertinent  surgical history.  Current Medications: Current Meds  Medication Sig  . atenolol (TENORMIN) 50 MG tablet Take 1 tablet (50 mg total) by mouth 2 (two) times daily.  Marland Kitchen levothyroxine (SYNTHROID, LEVOTHROID) 175 MCG tablet take 1 tablet by mouth once daily  . pantoprazole (PROTONIX) 40 MG tablet Take 40 mg by mouth 2 (two) times daily as needed (acid reflux).  Marland Kitchen PARoxetine (PAXIL) 40 MG tablet Take 1 tablet (40 mg total) by mouth daily.     Allergies:   Review of patient's allergies indicates no known allergies.   Social History   Social History  . Marital status: Married    Spouse name: N/A  . Number of children: N/A  . Years of education: N/A   Social History Main Topics  . Smoking status: Former Games developer  . Smokeless tobacco: Never Used  . Alcohol use No  . Drug use: No  . Sexual activity: Not Asked   Other Topics Concern  . 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   ROS:   Please see the history of present illness.    Review of Systems  Constitution: Positive for diaphoresis and malaise/fatigue.  HENT: Positive for headaches.   Cardiovascular: Positive for chest pain, dyspnea on exertion and irregular heartbeat.  Respiratory: Positive for snoring.   Musculoskeletal: Positive for back pain, joint swelling and myalgias.  Gastrointestinal: Positive for abdominal pain and constipation.  Neurological: Positive for loss of balance.  Psychiatric/Behavioral: The patient is nervous/anxious.    All other systems reviewed and are negative.   EKGs/Labs/Other Test Reviewed:    EKG:  EKG is  ordered today.  The ekg ordered today demonstrates NSR, HR 63, normal axis, QTc 462  Recent Labs: 02/04/2016: BUN 9;  Creatinine, Ser 0.62; Hemoglobin 13.7; Platelets 318; Potassium 3.8; Sodium 134 02/05/2016: TSH 0.229   Recent Lipid Panel    Component Value Date/Time   CHOL 162 02/05/2016 0743   TRIG 92 02/05/2016 0743   HDL 39 (L) 02/05/2016 0743   CHOLHDL 4.2 02/05/2016 0743   VLDL 18 02/05/2016 0743   LDLCALC 105 (H) 02/05/2016 0743   LDLDIRECT 113 (H) 11/22/2011 1703     Physical Exam:    VS:  BP 120/78 (BP Location: Left Arm, Cuff Size: Normal)   Pulse 63   Ht 5\' 5"  (1.651 m)   Wt 262 lb 6.4 oz (119 kg)   BMI 43.67 kg/m     Wt Readings from Last 3 Encounters:  02/24/16 262 lb 6.4 oz (119 kg)  02/16/16 265 lb (120.2 kg)  02/05/16 260 lb (117.9 kg)     Physical Exam  Constitutional: She is oriented to person, place, and time. She appears well-developed and well-nourished. No distress.  HENT:  Head: Normocephalic and atraumatic.  Eyes: No scleral icterus.  Neck: No JVD present.  Cardiovascular: Normal rate, regular rhythm, S1 normal and S2 normal.   Murmur heard.  Medium-pitched systolic murmur is present with a grade of 2/6  at the upper right sternal border Pulmonary/Chest: Effort normal. She has no wheezes. She has no rales.  Abdominal: Soft. There is no tenderness.  Musculoskeletal: She exhibits no edema.  Neurological: She is alert and oriented to person, place, and time.  Skin: Skin is warm and dry.  Psychiatric: She has a normal mood and affect.    ASSESSMENT:    1. Exertional angina (HCC)   2. Murmur   3. Essential hypertension   4. Gastroesophageal reflux disease without esophagitis    PLAN:    In order of problems listed above:  1. Chest pain - She has risk factors of age, FHx CAD, HTN, glucose intol and prior smoking.  She had a neg heart cath years ago.  But, I cannot pull up a report. So, it must have been > 10 years.  She has a lot of exertional symptoms that are different from her GERD.  She likely describes CCS Class 3 angina.  Stress testing may be  problematic given her size and nuclear images may not be interpretable.  Her symptoms seem to be progressive over the past few months.  I reviewed with Dr. Delane Ginger who also saw the patient today.  We have recommended proceeding with cardiac catheterization to further evaluate.  Risks and benefits of cardiac catheterization have been discussed with the patient.  These include bleeding, infection, kidney damage, stroke, heart attack, death.  The patient understands these risks and is willing to proceed.   -  Start ASA, prn NTG  -  Continue beta-blocker.  -  LHC later this week.  2. Murmur - Obtain an Echocardiogram.  3. HTN - BP controlled.  4. GERD - Continue PPI.    Medication Adjustments/Labs and Tests Ordered: Current medicines are reviewed at length with the patient today.  Concerns regarding medicines are outlined above.  Medication changes, Labs and Tests ordered today are outlined in the Patient Instructions noted below. Patient Instructions  Medication Instructions:  Your physician has recommended you make the following change in your medication:  1. START ASPIRIN 81 MG DAILY 2. YOU HAVE BEEN GIVEN AN RX FOR NITROGLYCERIN AND HAVE BEEN ADVISED AS TO HOW AND WHEN TO USE NTG  Labwork: TODAY BMET, CBC W/DIFF, PT/INR  Testing/Procedures: Your physician has requested that you have a cardiac catheterization. Cardiac catheterization is used to diagnose and/or treat various heart conditions. Doctors may recommend this procedure for a number of different reasons. The most common reason is to evaluate chest pain. Chest pain can be a symptom of coronary artery disease (CAD), and cardiac catheterization can show whether plaque is narrowing or blocking your heart's arteries. This procedure is also used to evaluate the valves, as well as measure the blood flow and oxygen levels in different parts of your heart. For further information please visit [email or web address] Please follow instruction  sheet, as given.  Your physician has requested that you have an echocardiogram. Echocardiography is a painless test that uses sound waves to create images of your heart. It provides your doctor with information about the size and shape of your heart and how well your heart's chambers and valves are working. This procedure takes approximately one hour. There are no restrictions for this procedure.  Follow-Up: Tereso Newcomer, [facility] 03/15/16 @ 9:15 POST PROCEDURE FOLLOW UP   Any Other Special Instructions Will Be Listed Below (If Applicable).  If you need a refill on your cardiac medications before your next appointment, please call your pharmacy.  Signed, Tereso Newcomer, PA-C  02/24/2016 5:16 PM    [facility] [address] [place], [place], [place]  [address] Phone: [phone]; Fax: [phone]

## 2016-02-27 NOTE — Discharge Instructions (Signed)
Radial Site Care °Refer to this sheet in the next few weeks. These instructions provide you with information about caring for yourself after your procedure. Your health care provider may also give you more specific instructions. Your treatment has been planned according to current medical practices, but problems sometimes occur. Call your health care provider if you have any problems or questions after your procedure. °WHAT TO EXPECT AFTER THE PROCEDURE °After your procedure, it is typical to have the following: °· Bruising at the radial site that usually fades within 1-2 weeks. °· Blood collecting in the tissue (hematoma) that may be painful to the touch. It should usually decrease in size and tenderness within 1-2 weeks. °HOME CARE INSTRUCTIONS °· Take medicines only as directed by your health care provider. °· You may shower 24-48 hours after the procedure or as directed by your health care provider. Remove the bandage (dressing) and gently wash the site with plain soap and water. Pat the area dry with a clean towel. Do not rub the site, because this may cause bleeding. °· Do not take baths, swim, or use a hot tub until your health care provider approves. °· Check your insertion site every day for redness, swelling, or drainage. °· Do not apply powder or lotion to the site. °· Do not flex or bend the affected arm for 24 hours or as directed by your health care provider. °· Do not push or pull heavy objects with the affected arm for 24 hours or as directed by your health care provider. °· Do not lift over 10 lb (4.5 kg) for 5 days after your procedure or as directed by your health care provider. °· Ask your health care provider when it is okay to: °¨ Return to work or school. °¨ Resume usual physical activities or sports. °¨ Resume sexual activity. °· Do not drive home if you are discharged the same day as the procedure. Have someone else drive you. °· You may drive 24 hours after the procedure unless otherwise  instructed by your health care provider. °· Do not operate machinery or power tools for 24 hours after the procedure. °· If your procedure was done as an outpatient procedure, which means that you went home the same day as your procedure, a responsible adult should be with you for the first 24 hours after you arrive home. °· Keep all follow-up visits as directed by your health care provider. This is important. °SEEK MEDICAL CARE IF: °· You have a fever. °· You have chills. °· You have increased bleeding from the radial site. Hold pressure on the site. °SEEK IMMEDIATE MEDICAL CARE IF: °· You have unusual pain at the radial site. °· You have redness, warmth, or swelling at the radial site. °· You have drainage (other than a small amount of blood on the dressing) from the radial site. °· The radial site is bleeding, and the bleeding does not stop after 30 minutes of holding steady pressure on the site. °· Your arm or hand becomes pale, cool, tingly, or numb. °  °This information is not intended to replace advice given to you by your health care provider. Make sure you discuss any questions you have with your health care provider. °  °Document Released: 05/22/2010 Document Revised: 05/10/2014 Document Reviewed: 11/05/2013 °Elsevier Interactive Patient Education ©2016 Elsevier Inc. ° °

## 2016-02-27 NOTE — Interval H&P Note (Signed)
Cath Lab Visit (complete for each Cath Lab visit)  Clinical Evaluation Leading to the Procedure:   ACS: No.  Non-ACS:    Anginal Classification: CCS III  Anti-ischemic medical therapy: Minimal Therapy (1 class of medications)  Non-Invasive Test Results: No non-invasive testing performed  Prior CABG: No previous CABG      History and Physical Interval Note:  02/27/2016 10:29 AM  Cheryl Castro  has presented today for surgery, with the diagnosis of Angina  The various methods of treatment have been discussed with the patient and family. After consideration of risks, benefits and other options for treatment, the patient has consented to  Procedure(s): Left Heart Cath and Coronary Angiography (N/A) as a surgical intervention .  The patient's history has been reviewed, patient examined, no change in status, stable for surgery.  I have reviewed the patient's chart and labs.  Questions were answered to the patient's satisfaction.     Lance MussJayadeep Kemisha Bonnette

## 2016-03-15 ENCOUNTER — Ambulatory Visit: Payer: Self-pay | Admitting: Physician Assistant

## 2016-03-15 DIAGNOSIS — I2510 Atherosclerotic heart disease of native coronary artery without angina pectoris: Secondary | ICD-10-CM | POA: Insufficient documentation

## 2016-03-15 NOTE — Progress Notes (Deleted)
Cardiology Office Note:    Date:  03/15/2016   ID:  Cheryl PontoMarsha C Castro, DOB [birth year], MRN [id number]  PCP:  Howard PouchLauren Feng, MD  Cardiologist:  Dr. Delane GingerPhil Nahser   Electrophysiologist:  n/a  Referring MD: Howard PouchFeng, Lauren, MD   No chief complaint on file. ***  History of Present Illness:    Cheryl Castro is a 65 y.o. female with a hx of HTN, obesity, anxiety, GERD/hiatal hernia, hypothyroidism. She was admitted in 10/17 with chest discomfort. She followed up in the office with me and Dr. Elease HashimotoNahser 10/24. Cardiac catheterization was recommended. 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  Of note, the patient did have a murmur on exam and an echocardiogram is still pending.   She returns for FU.  ***  Prior CV studies that were reviewed today include:    LHC 02/27/16 LAD mid 10 RCA mid 10 EF 55-65  Past Medical History:  Diagnosis Date  . Anxiety   . GERD (gastroesophageal reflux disease)   . Glucose intolerance (impaired glucose tolerance)   . Hiatal hernia   . Hypertension   . Hypothyroidism   . Sleep apnea    no longer using CPAP    Past Surgical History:  Procedure Laterality Date  . CARDIAC CATHETERIZATION N/A 02/27/2016   Procedure: Left Heart Cath and Coronary Angiography;  Surgeon: Corky CraftsJayadeep S Varanasi, MD;  [facility];  Service: Cardiovascular;  Laterality: N/A;    Current Medications: No outpatient prescriptions have been marked as taking for the 03/15/16 encounter (Appointment) with Beatrice LecherScott T Tashera Montalvo, PA-C.     Allergies:   Patient has no known allergies.   Social History   Social History  . Marital status: Married    Spouse name: N/A  . Number of children: N/A  . Years of education: N/A   Social History Main Topics  . Smoking status: Former Games developermoker  . Smokeless tobacco: Never Used  . Alcohol use No  . Drug use: No  . Sexual activity: Not on file   Other Topics Concern  . 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   ROS:   Please see the history of present illness.    ROS All other systems reviewed and are negative.   EKGs/Labs/Other Test Reviewed:    EKG:  EKG is *** ordered today.  The ekg ordered today demonstrates ***  Recent Labs: 02/05/2016: TSH 0.229 02/24/2016: BUN 14; Creat 0.74; Hemoglobin 13.9; Platelets 348; Potassium 4.2; Sodium 137   Recent Lipid Panel    Component Value Date/Time   CHOL 162 02/05/2016 0743   TRIG 92 02/05/2016 0743   HDL 39 (L) 02/05/2016 0743   CHOLHDL 4.2 02/05/2016 0743   VLDL 18 02/05/2016 0743   LDLCALC 105 (H) 02/05/2016 0743   LDLDIRECT 113 (H) 11/22/2011 1703     Physical Exam:    VS:  There were no vitals taken for this visit.    Wt Readings from Last 3 Encounters:  02/27/16 260 lb (117.9 kg)  02/24/16 262 lb 6.4 oz (119 kg)  02/16/16 265 lb (120.2 kg)     ***Physical Exam  ASSESSMENT:    No  diagnosis found. PLAN:    In order of problems listed above:  1. CAD - Minimal plaque on LHC.  She has had non-cardiac chest pain. Continue ASA.  LDL in 10/17 was 105.  ***Statin Rx. 2. HTN - *** 3. Murmur - ***Echo is still pending and will be done this week.   Medication Adjustments/Labs and Tests Ordered: Current medicines are reviewed at length with the patient today.  Concerns regarding medicines are outlined above.  Medication changes, Labs and Tests ordered today are outlined in the Patient Instructions noted below. There are no Patient Instructions on file for this visit. Signed, Tereso NewcomerScott Soffia Doshier, PA-C  03/15/2016 7:46 AM    [facility] [address], [place], [place]  [address] Phone: [phone]; Fax: [phone]

## 2016-03-19 ENCOUNTER — Other Ambulatory Visit (HOSPITAL_COMMUNITY): Payer: Self-pay

## 2016-05-24 ENCOUNTER — Other Ambulatory Visit: Payer: Self-pay | Admitting: *Deleted

## 2016-05-25 MED ORDER — PAROXETINE HCL 40 MG PO TABS: 40.0000 mg | ORAL_TABLET | Freq: Every day | ORAL | 3 refills | Status: DC

## 2016-06-09 ENCOUNTER — Encounter: Payer: Self-pay | Admitting: Family Medicine

## 2016-06-09 ENCOUNTER — Ambulatory Visit (INDEPENDENT_AMBULATORY_CARE_PROVIDER_SITE_OTHER): Payer: Self-pay | Admitting: Family Medicine

## 2016-06-09 VITALS — BP 120/74 | HR 69 | Temp 98.6°F | Ht 65.0 in | Wt 275.0 lb

## 2016-06-09 DIAGNOSIS — E039 Hypothyroidism, unspecified: Secondary | ICD-10-CM

## 2016-06-09 DIAGNOSIS — Z111 Encounter for screening for respiratory tuberculosis: Secondary | ICD-10-CM

## 2016-06-09 LAB — TSH: TSH: 0.05 mIU/L — ABNORMAL LOW

## 2016-06-09 NOTE — Progress Notes (Signed)
   CC: wants TSH checked  HPI Originally had hyperthyroidism, was treated with radioactive iodine. Dr. Jarvis NewcomerGrunz had lowered dose because she had lost weight through a weight loss program. Has now gained weight. Has actually been taking 1.5x her rx'd dose. Has been taking this for 2-3 months.   Typical hypo symptoms: HA, lightheaded, feeling drained.  Denies fevers or unintentional weight loss.  CC, SH/smoking status, and VS noted  Objective: BP 120/74 (BP Location: Right Arm, Patient Position: Sitting, Cuff Size: Normal)   Pulse 69   Temp 98.6 F (37 C) (Oral)   Ht 5\' 5"  (1.651 m)   Wt 275 lb (124.7 kg)   SpO2 99%   BMI 45.76 kg/m  Gen: NAD, alert, cooperative, and pleasant overweight female. HEENT: NCAT, EOMI, PERRL. No goiter appreciated.  CV: RRR, no murmur Resp: CTAB, no wheezes, non-labored Abd: SNTND, BS present, no guarding or organomegaly Ext: No edema, warm Neuro: Alert and oriented, Speech clear, No gross deficits  Assessment and plan:  Hypothyroidism Patient concerned that she is currently hypothyroid, s/p radioactive iodine. Will check TSH and titrate as needed. Has been taking ~250mg  of synthroid x 2-3 months (self increased). Hx of cardiac concerns, cath in 10/17 with minimal CAD. TSH resulted at 0.05, suggesting that synthroid dose is too high. Called and left VM to discuss dose titration (plan to go down to 175mcg again and recheck in 6-8weeks).    Orders Placed This Encounter  Procedures  . TSH  . PPD    Order Specific Question:   Has patient ever tested positive?    Answer:   No   Health Maintenance: Overdue for colonoscopy (never), mammo, and zostavax. Given papers for colonoscopy and mammo, zostavax ordered. 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   Loni MuseKate Timberlake, MD, PGY1 06/10/2016 10:08 AM

## 2016-06-09 NOTE — Patient Instructions (Signed)
It was a pleasure to see you today! Thank you for choosing [facility] for your primary care. Marylen PontoMarsha C Roskos was seen for checking thyroid levels and adjusting thyroid medicine.   Best,  Dr. Chanetta Marshallimberlake

## 2016-06-10 NOTE — Assessment & Plan Note (Addendum)
Patient concerned that she is currently hypothyroid, s/p radioactive iodine. Will check TSH and titrate as needed. Has been taking ~250mg  of synthroid x 2-3 months (self increased). Hx of cardiac concerns, cath in 10/17 with minimal CAD. TSH resulted at 0.05, suggesting that synthroid dose is too high. Called and left VM to discuss dose titration (plan to go down to 175mcg again and recheck in 6-8weeks).

## 2016-06-11 ENCOUNTER — Ambulatory Visit (INDEPENDENT_AMBULATORY_CARE_PROVIDER_SITE_OTHER): Payer: Self-pay | Admitting: *Deleted

## 2016-06-11 ENCOUNTER — Encounter: Payer: Self-pay | Admitting: *Deleted

## 2016-06-11 DIAGNOSIS — Z712 Person consulting for explanation of examination or test findings: Secondary | ICD-10-CM

## 2016-06-11 DIAGNOSIS — Z7189 Other specified counseling: Secondary | ICD-10-CM

## 2016-06-11 DIAGNOSIS — Z111 Encounter for screening for respiratory tuberculosis: Secondary | ICD-10-CM

## 2016-06-11 LAB — TB SKIN TEST
Induration: 0 mm
TB Skin Test: NEGATIVE

## 2016-06-11 NOTE — Progress Notes (Signed)
  PPD Reading Note PPD read and results entered in EpicCare. Result: 0 mm induration. Interpretation: Negative If test not read within 48-72 hours of initial placement, patient advised to repeat in other arm 1-3 weeks after this test. Allergic reaction: no  Patient also given her TSH level 0.05 which has dropped from previous level.  Patient increased her dosage of 250 mcg.  Advised patient that Dr. Chanetta Marshallimberlake wanted her to go back to original dosage of 175 mcg and follow up with her PCP in 6-8 weeks.  Advised patient not to increase her medication without consulting her PCP first.  Self increasing medication can be damaging and dangerous.  Advised patient if she develop any symptoms of hyperthyroidism or hypothyroidism to call clinic for an appointment or go to ED.    Clovis PuMartin, Tamika L, RN

## 2016-10-15 ENCOUNTER — Telehealth: Payer: Self-pay | Admitting: Student in an Organized Health Care Education/Training Program

## 2016-10-15 NOTE — Telephone Encounter (Signed)
Pt is calling for a refill on her Levothyroxine to be called in. jw  ° °

## 2016-10-20 ENCOUNTER — Other Ambulatory Visit: Payer: Self-pay | Admitting: Student in an Organized Health Care Education/Training Program

## 2016-10-20 MED ORDER — LEVOTHYROXINE SODIUM 175 MCG PO TABS: 175.0000 ug | ORAL_TABLET | Freq: Every day | ORAL | 3 refills | Status: DC

## 2016-10-20 NOTE — Progress Notes (Signed)
Levothyroxine refilled at [facility] on [address]

## 2016-12-22 ENCOUNTER — Emergency Department (HOSPITAL_COMMUNITY)
Admission: EM | Admit: 2016-12-22 | Discharge: 2016-12-22 | Disposition: A | Discharge status: Home / Self Care | Payer: Self-pay | Attending: Physician Assistant | Admitting: Physician Assistant

## 2016-12-22 ENCOUNTER — Emergency Department (HOSPITAL_COMMUNITY): Payer: Self-pay

## 2016-12-22 ENCOUNTER — Encounter (HOSPITAL_COMMUNITY): Payer: Self-pay

## 2016-12-22 DIAGNOSIS — M722 Plantar fascial fibromatosis: Secondary | ICD-10-CM | POA: Insufficient documentation

## 2016-12-22 DIAGNOSIS — I10 Essential (primary) hypertension: Secondary | ICD-10-CM | POA: Insufficient documentation

## 2016-12-22 DIAGNOSIS — R1013 Epigastric pain: Secondary | ICD-10-CM | POA: Insufficient documentation

## 2016-12-22 DIAGNOSIS — Z7982 Long term (current) use of aspirin: Secondary | ICD-10-CM | POA: Insufficient documentation

## 2016-12-22 DIAGNOSIS — Z87891 Personal history of nicotine dependence: Secondary | ICD-10-CM | POA: Insufficient documentation

## 2016-12-22 DIAGNOSIS — Z79899 Other long term (current) drug therapy: Secondary | ICD-10-CM | POA: Insufficient documentation

## 2016-12-22 DIAGNOSIS — R0789 Other chest pain: Secondary | ICD-10-CM | POA: Insufficient documentation

## 2016-12-22 LAB — COMPREHENSIVE METABOLIC PANEL
ALK PHOS: 87 U/L (ref 38–126)
ALT: 19 U/L (ref 14–54)
AST: 23 U/L (ref 15–41)
Albumin: 3.9 g/dL (ref 3.5–5.0)
Anion gap: 7 (ref 5–15)
BUN: 9 mg/dL (ref 6–20)
CALCIUM: 9.4 mg/dL (ref 8.9–10.3)
CO2: 27 mmol/L (ref 22–32)
CREATININE: 0.67 mg/dL (ref 0.44–1.00)
Chloride: 106 mmol/L (ref 101–111)
Glucose, Bld: 101 mg/dL — ABNORMAL HIGH (ref 65–99)
Potassium: 4.4 mmol/L (ref 3.5–5.1)
Sodium: 140 mmol/L (ref 135–145)
Total Bilirubin: 0.3 mg/dL (ref 0.3–1.2)
Total Protein: 7.4 g/dL (ref 6.5–8.1)

## 2016-12-22 LAB — CBC
HCT: 44.6 % (ref 36.0–46.0)
Hemoglobin: 14.7 g/dL (ref 12.0–15.0)
MCH: 28.2 pg (ref 26.0–34.0)
MCHC: 33 g/dL (ref 30.0–36.0)
MCV: 85.4 fL (ref 78.0–100.0)
PLATELETS: 339 10*3/uL (ref 150–400)
RBC: 5.22 MIL/uL — ABNORMAL HIGH (ref 3.87–5.11)
RDW: 13.3 % (ref 11.5–15.5)
WBC: 5.4 10*3/uL (ref 4.0–10.5)

## 2016-12-22 LAB — URINALYSIS, ROUTINE W REFLEX MICROSCOPIC
BILIRUBIN URINE: NEGATIVE
Glucose, UA: NEGATIVE mg/dL
Ketones, ur: NEGATIVE mg/dL
Leukocytes, UA: NEGATIVE
NITRITE: NEGATIVE
PH: 6 (ref 5.0–8.0)
Protein, ur: NEGATIVE mg/dL
SPECIFIC GRAVITY, URINE: 1.004 — AB (ref 1.005–1.030)

## 2016-12-22 LAB — LIPASE, BLOOD: Lipase: 35 U/L (ref 11–51)

## 2016-12-22 MED ORDER — ACETAMINOPHEN 325 MG PO TABS
650.0000 mg | ORAL_TABLET | Freq: Once | ORAL | Status: AC
  Administered 2016-12-22: 650 mg via ORAL
  Filled 2016-12-22: qty 2

## 2016-12-22 MED ORDER — FAMOTIDINE 20 MG PO TABS
20.0000 mg | ORAL_TABLET | Freq: Once | ORAL | Status: AC
  Administered 2016-12-22: 20 mg via ORAL
  Filled 2016-12-22: qty 1

## 2016-12-22 MED ORDER — DICYCLOMINE HCL 10 MG PO CAPS: 10.0000 mg | ORAL_CAPSULE | Freq: Three times a day (TID) | ORAL | 0 refills | Status: DC

## 2016-12-22 MED ORDER — RABEPRAZOLE SODIUM 20 MG PO TBEC: 20.0000 mg | DELAYED_RELEASE_TABLET | Freq: Every day | ORAL | 1 refills | Status: DC

## 2016-12-22 MED ORDER — MELOXICAM 15 MG PO TABS: 15.0000 mg | ORAL_TABLET | Freq: Every day | ORAL | 0 refills | Status: DC

## 2016-12-22 NOTE — ED Provider Notes (Signed)
[facility]ENCY DEPT Provider Note   CSN: [id number] Arrival date & time: 12/22/16  1321     History   Chief Complaint Chief Complaint  Patient presents with  . Abdominal Pain    HPI Cheryl Castro is a 66 y.o. female.He presents emergency Department with several complaints. Chiefly, the patient complains of pain in her right flank and chest wall. Patient states that this has been going on for about a week. She feels like it is inside. It is associated with pain in her right upper quadrant of the abdomen. She is unsure if it is postprandial. It seems to be worse when she lies flat and better when she sits up. She denies hemoptysis, unilateral leg swelling, pleuritic chest pain. She states it is been going on for about a week and a half. Sometimes she feels that it radiates into the right groin. She denies urinary symptoms, fevers, chills. She has no history of kidney stones. She denies nausea or vomiting. The patient has had several years of pain in her epigastrium radiating behind her sternum. She states that she had a negative heart catheterization. She is a history of hiatal hernia and reflux. She does not take anything for her reflux. It is worse when she lies flat and better when she sits up, also worse at night. Lastly, the patient complains of pain in her left heel. She has a history of pain in the foot that has been going on for several months. It is worse with her first step and worse after he rests. She has no pain unless she ambulates. She noticed an area on her foot that feels achy "knot." She denies any numbness or tingling, injuries to the foot.  HPI  Past Medical History:  Diagnosis Date  . Anxiety   . GERD (gastroesophageal reflux disease)   . Glucose intolerance (impaired glucose tolerance)   . Hiatal hernia   . Hypertension   . Hypothyroidism   . Sleep apnea    no longer using CPAP    Patient Active Problem List   Diagnosis Date Noted  . Minimal CAD on Cardiac  Catheterization 03/2016 03/15/2016  . Numbness and tingling in left arm 02/17/2016  . Chest pain with low risk for cardiac etiology 02/05/2016  . Gastroesophageal reflux disease   . Pain in joint, ankle and foot 05/01/2015  . Healthcare maintenance 10/09/2014  . Palpitations 10/09/2014  . Bipolar II disorder (HCC) 01/30/2013  . Generalized anxiety disorder 01/02/2013  . Reflux gastritis 11/22/2011  . Hypothyroidism 04/10/2010  . PELVIC PAIN, CHRONIC 04/02/2010  . Obesity 02/25/2010  . Depression 12/30/2009  . Pruritus of skin 07/28/2009  . BACK PAIN, CHRONIC 07/28/2009  . Essential hypertension, benign 06/24/2009    Past Surgical History:  Procedure Laterality Date  . CARDIAC CATHETERIZATION N/A 02/27/2016   Procedure: Left Heart Cath and Coronary Angiography;  Surgeon: Corky Crafts, MD;  Location: [facility] INVASIVE CV LAB;  Service: Cardiovascular;  Laterality: N/A;    OB History    No data available       Home Medications    Prior to Admission medications   Medication Sig Start Date End Date Taking? Authorizing Provider  aspirin EC 81 MG tablet Take 1 tablet (81 mg total) by mouth daily. 02/24/16   Tereso Newcomer T, PA-C  aspirin-acetaminophen-caffeine (EXCEDRIN MIGRAINE) [phone] MG tablet Take 2 tablets by mouth 2 (two) times daily as needed for headache.    [provider]  atenolol (TENORMIN) 50 MG tablet  Take 1 tablet (50 mg total) by mouth 2 (two) times daily. 02/20/16   Wendee Beavers, DO  diphenhydrAMINE (BENADRYL) 25 MG tablet Take 12.5 mg by mouth at bedtime.    [provider]  levothyroxine (SYNTHROID, LEVOTHROID) 175 MCG tablet Take 1 tablet (175 mcg total) by mouth daily. 10/20/16   Howard Pouch, MD  nitroGLYCERIN (NITROSTAT) 0.4 MG SL tablet Place 1 tablet (0.4 mg total) under the tongue every 5 (five) minutes as needed. 02/24/16   Tereso Newcomer T, PA-C  Omega-3 Fatty Acids (OMEGA-3 PO) Take 1 tablet by mouth 3 (three) times daily.     [provider]  pantoprazole (PROTONIX) 40 MG tablet Take 40 mg by mouth 2 (two) times daily as needed (acid reflux).    [provider]  PARoxetine (PAXIL) 40 MG tablet Take 1 tablet (40 mg total) by mouth daily. 05/25/16   Howard Pouch, MD    Family History Family History  Problem Relation Age of Onset  . Diabetes Other   . Hypertension Other   . CAD Other   . Heart attack Father        died of MI age 15  . Coronary artery disease Brother     Social History Social History  Substance Use Topics  . Smoking status: Former Games developer  . Smokeless tobacco: Never Used  . Alcohol use No     Allergies   Patient has no known allergies.   Review of Systems Review of Systems  Ten systems reviewed and are negative for acute change, except as noted in the HPI.   Physical Exam Updated Vital Signs BP 139/87 (BP Location: Right Arm)   Pulse 75   Temp 98 F (36.7 C) (Oral)   Resp 18   Ht 5\' 5"  (1.651 m)   Wt 113.4 kg (250 lb)   SpO2 99%   BMI 41.60 kg/m   Physical Exam  Constitutional: She is oriented to person, place, and time. She appears well-developed and well-nourished. No distress.  HENT:  Head: Normocephalic and atraumatic.  Eyes: Conjunctivae are normal. No scleral icterus.  Neck: Normal range of motion.  Cardiovascular: Normal rate, regular rhythm and normal heart sounds.  Exam reveals no gallop and no friction rub.   No murmur heard. Pulmonary/Chest: Effort normal and breath sounds normal. No respiratory distress.  Abdominal: Soft. Bowel sounds are normal. She exhibits no distension and no mass. There is tenderness. There is no guarding.  ttp right upper quadrant. Murphy sign is equivocal. No CVA tenderness  Musculoskeletal:  No midline spinal tenderness, full range of motion of the spine without pain.  Left heel pain with deep palpation of the plantar fascia, and dorsiflexion. There is a large callus on the left heel  Neurological: She is alert  and oriented to person, place, and time.  Skin: Skin is warm and dry. She is not diaphoretic.  Psychiatric: Her behavior is normal.  Nursing note and vitals reviewed.    ED Treatments / Results  Labs (all labs ordered are listed, but only abnormal results are displayed) Labs Reviewed  COMPREHENSIVE METABOLIC PANEL - Abnormal; Notable for the following:       Result Value   Glucose, Bld 101 (*)    All other components within normal limits  CBC - Abnormal; Notable for the following:    RBC 5.22 (*)    All other components within normal limits  LIPASE, BLOOD    EKG  EKG Interpretation None  Radiology No results found.  Procedures Procedures (including critical care time)  Medications Ordered in ED Medications - No data to display   Initial Impression / Assessment and Plan / ED Course  I have reviewed the triage vital signs and the nursing notes.  Pertinent labs & imaging results that were available during my care of the patient were reviewed by me and considered in my medical decision making (see chart for details).     The patient's workup negative for any acute abnormalities. I believe that her pain is musculoskeletal. She has no hypoxia or tachycardia to suggest pulmonary embolus as a cause of her pain. No evidence of herpes zoster. He has been going on for more than a week. She is not short of breath and has no pleuritic pain. Patient is obese with large pendulous breasts, which I think are contributing to her pain. Patient also appears to have plantar fasciitis. Referral to podiatry given. Patient also given a referral for management of her hypertension. She is also to start taking AcipHex for her reflux. She appears safe for discharge at this time.  Final Clinical Impressions(s) / ED Diagnoses   Final diagnoses:  Chest wall pain  Epigastric abdominal pain  Plantar fasciitis of left foot  Hypertension, unspecified type    New Prescriptions New  Prescriptions   No medications on file     Arthor Captain, PA-C 12/23/16 0050    Abelino Derrick, MD 12/24/16 [phone]

## 2016-12-22 NOTE — Discharge Instructions (Addendum)
Your work up is negative for any acute abnormalities Your gallbladder was also normal Your pain does not appear to be from a life, limb, or organ threatening illness. Your pain appears to be muscular. Please treat your reflux. Follow up with the doctors . I have given you a referral to. Return for any chest pain, shortness of breath, bloody cough, fevers,or if you loose consciousness

## 2016-12-22 NOTE — ED Triage Notes (Signed)
Pt presents for evaluation of RUQ abd pain worse after eating x 1 week. Pt reports gas and bloating. Denies N/V/D.

## 2017-02-28 ENCOUNTER — Telehealth: Payer: Self-pay | Admitting: Family Medicine

## 2017-02-28 ENCOUNTER — Telehealth: Payer: Self-pay | Admitting: *Deleted

## 2017-02-28 MED ORDER — ATENOLOL 50 MG PO TABS: 50.0000 mg | ORAL_TABLET | Freq: Two times a day (BID) | ORAL | 11 refills | Status: DC

## 2017-02-28 NOTE — Telephone Encounter (Signed)
Patient calling requesting refill on atenolol. Patient states she only take 50mg  daily. Appointment scheduled for 11/6 for bp concerns.

## 2017-02-28 NOTE — Telephone Encounter (Signed)
**  After Hours/ Emergency Line Call*  Received a call to report that Cheryl Castro would like a refill on her Atenolol. She states that she hasn't been with her medication all day. The pharmacist gave her a couple pills to last her over the weekend. States that she has "side effects" if she doesn't have her medication.  Recommended that patient give PCP more notice when she needs refills on her medications and that we typically do not refill medication over the emergency line. Discussed I would send her 1 refill this time.  Red flags discussed.  Will forward to PCP.  Beaulah Dinninghristina M Etter Royall, MD PGY-3, [facility]

## 2017-03-08 ENCOUNTER — Ambulatory Visit: Payer: Self-pay | Admitting: Family Medicine

## 2017-03-30 DIAGNOSIS — E039 Hypothyroidism, unspecified: Secondary | ICD-10-CM | POA: Diagnosis not present

## 2017-03-30 DIAGNOSIS — Z23 Encounter for immunization: Secondary | ICD-10-CM | POA: Diagnosis not present

## 2017-03-30 DIAGNOSIS — M722 Plantar fascial fibromatosis: Secondary | ICD-10-CM | POA: Diagnosis not present

## 2017-03-30 DIAGNOSIS — M25519 Pain in unspecified shoulder: Secondary | ICD-10-CM | POA: Diagnosis not present

## 2017-03-30 DIAGNOSIS — M25559 Pain in unspecified hip: Secondary | ICD-10-CM | POA: Diagnosis not present

## 2017-03-30 DIAGNOSIS — I10 Essential (primary) hypertension: Secondary | ICD-10-CM | POA: Diagnosis not present

## 2017-03-30 DIAGNOSIS — Z131 Encounter for screening for diabetes mellitus: Secondary | ICD-10-CM | POA: Diagnosis not present

## 2017-03-30 DIAGNOSIS — Z13220 Encounter for screening for lipoid disorders: Secondary | ICD-10-CM | POA: Diagnosis not present

## 2017-03-30 DIAGNOSIS — Z1239 Encounter for other screening for malignant neoplasm of breast: Secondary | ICD-10-CM | POA: Diagnosis not present

## 2017-03-30 DIAGNOSIS — Z111 Encounter for screening for respiratory tuberculosis: Secondary | ICD-10-CM | POA: Diagnosis not present

## 2017-04-01 ENCOUNTER — Other Ambulatory Visit: Payer: Self-pay | Admitting: Internal Medicine

## 2017-04-01 DIAGNOSIS — E2839 Other primary ovarian failure: Secondary | ICD-10-CM

## 2017-04-14 ENCOUNTER — Other Ambulatory Visit: Payer: Self-pay | Admitting: Internal Medicine

## 2017-04-14 DIAGNOSIS — Z1231 Encounter for screening mammogram for malignant neoplasm of breast: Secondary | ICD-10-CM

## 2017-04-20 DIAGNOSIS — I10 Essential (primary) hypertension: Secondary | ICD-10-CM | POA: Diagnosis not present

## 2017-04-20 DIAGNOSIS — M25519 Pain in unspecified shoulder: Secondary | ICD-10-CM | POA: Diagnosis not present

## 2017-05-06 ENCOUNTER — Encounter: Payer: Self-pay | Admitting: Podiatry

## 2017-05-06 ENCOUNTER — Ambulatory Visit (INDEPENDENT_AMBULATORY_CARE_PROVIDER_SITE_OTHER): Payer: Medicare Other

## 2017-05-06 ENCOUNTER — Ambulatory Visit (INDEPENDENT_AMBULATORY_CARE_PROVIDER_SITE_OTHER): Payer: Medicare Other | Admitting: Podiatry

## 2017-05-06 VITALS — BP 155/88 | HR 61 | Resp 16

## 2017-05-06 DIAGNOSIS — M722 Plantar fascial fibromatosis: Secondary | ICD-10-CM | POA: Diagnosis not present

## 2017-05-06 DIAGNOSIS — M205X9 Other deformities of toe(s) (acquired), unspecified foot: Secondary | ICD-10-CM

## 2017-05-06 MED ORDER — TRIAMCINOLONE ACETONIDE 10 MG/ML IJ SUSP
10.0000 mg | Freq: Once | INTRAMUSCULAR | Status: AC
  Administered 2017-05-06: 10 mg

## 2017-05-06 NOTE — Progress Notes (Signed)
Subjective:   Patient ID: Cheryl Castro, female   DOB: 67 y.o.   MRN: [id number]   HPI Patient presents with quite a bit of pain in the left plantar medial also has discomfort at times in the big toe joint but not hurt her for the last several months   Review of Systems  All other systems reviewed and are negative.       Objective:  Physical Exam  Constitutional: She appears well-developed and well-nourished.  Cardiovascular: Intact distal pulses.  Pulmonary/Chest: Effort normal.  Musculoskeletal: Normal range of motion.  Neurological: She is alert.  Skin: Skin is warm.  Nursing note and vitals reviewed. Neurovascular status found to be intact with muscle strength adequate with patient noted to have moderate reduction motion first MPJ bilateral with crepitus of the joint and acute inflammation and pain of the plantar heel left with discomfort upon insertion.  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

## 2017-05-06 NOTE — Progress Notes (Signed)
   Subjective:    Patient ID: Cheryl PontoMarsha C Bergerson, female    DOB: February 25, 1951, 67 y.o.   MRN: [id number]  HPI    Review of Systems  All other systems reviewed and are negative.      Objective:   Physical Exam        Assessment & Plan:

## 2017-05-06 NOTE — Patient Instructions (Addendum)
Gout Gout is painful swelling that can happen in some of your joints. Gout is a type of arthritis. This condition is caused by having too much uric acid in your body. Uric acid is a chemical that is made when your body breaks down substances called purines. If your body has too much uric acid, sharp crystals can form and build up in your joints. This causes pain and swelling. Gout attacks can happen quickly and be very painful (acute gout). Over time, the attacks can affect more joints and happen more often (chronic gout). Follow these instructions at home: During a Gout Attack  If directed, put ice on the painful area: ? Put ice in a plastic bag. ? Place a towel between your skin and the bag. ? Leave the ice on for 20 minutes, 2-3 times a day.  Rest the joint as much as possible. If the joint is in your leg, you may be given crutches to use.  Raise (elevate) the painful joint above the level of your heart as often as you can.  Drink enough fluids to keep your pee (urine) clear or pale yellow.  Take over-the-counter and prescription medicines only as told by your doctor.  Do not drive or use heavy machinery while taking prescription pain medicine.  Follow instructions from your doctor about what you can or cannot eat and drink.  Return to your normal activities as told by your doctor. Ask your doctor what activities are safe for you. Avoiding Future Gout Attacks  Follow a low-purine diet as told by a specialist (dietitian) or your doctor. Avoid foods and drinks that have a lot of purines, such as: ? Liver. ? Kidney. ? Anchovies. ? Asparagus. ? Herring. ? Mushrooms ? Mussels. ? Beer.  Limit alcohol intake to no more than 1 drink a day for nonpregnant women and 2 drinks a day for men. One drink equals 12 oz of beer, 5 oz of wine, or 1 oz of hard liquor.  Stay at a healthy weight or lose weight if you are overweight. If you want to lose weight, talk with your doctor. It is  important that you do not lose weight too fast.  Start or continue an exercise plan as told by your doctor.  Drink enough fluids to keep your pee clear or pale yellow.  Take over-the-counter and prescription medicines only as told by your doctor.  Keep all follow-up visits as told by your doctor. This is important. Contact a doctor if:  You have another gout attack.  You still have symptoms of a gout attack after10 days of treatment.  You have problems (side effects) because of your medicines.  You have chills or a fever.  You have burning pain when you pee (urinate).  You have pain in your lower back or belly. Get help right away if:  You have very bad pain.  Your pain cannot be controlled.  You cannot pee. This information is not intended to replace advice given to you by your health care provider. Make sure you discuss any questions you have with your health care provider. Document Released: 01/27/2008 Document Revised: 09/25/2015 Document Reviewed: 01/30/2015 Elsevier Interactive Patient Education  2018 [employer]    Plantar Fasciitis (Heel Spur Syndrome) with Rehab The plantar fascia is a fibrous, ligament-like, soft-tissue structure that spans the bottom of the foot. Plantar fasciitis is a condition that causes pain in the foot due to inflammation of the tissue. SYMPTOMS   Pain and tenderness on  the underneath side of the foot.  Pain that worsens with standing or walking. CAUSES  Plantar fasciitis is caused by irritation and injury to the plantar fascia on the underneath side of the foot. Common mechanisms of injury include:  Direct trauma to bottom of the foot.  Damage to a small nerve that runs under the foot where the main fascia attaches to the heel bone.  Stress placed on the plantar fascia due to bone spurs. RISK INCREASES WITH:   Activities that place stress on the plantar fascia (running, jumping, pivoting, or cutting).  Poor strength and  flexibility.  Improperly fitted shoes.  Tight calf muscles.  Flat feet.  Failure to warm-up properly before activity.  Obesity. PREVENTION  Warm up and stretch properly before activity.  Allow for adequate recovery between workouts.  Maintain physical fitness:  Strength, flexibility, and endurance.  Cardiovascular fitness.  Maintain a health body weight.  Avoid stress on the plantar fascia.  Wear properly fitted shoes, including arch supports for individuals who have flat feet.  PROGNOSIS  If treated properly, then the symptoms of plantar fasciitis usually resolve without surgery. However, occasionally surgery is necessary.  RELATED COMPLICATIONS   Recurrent symptoms that may result in a chronic condition.  Problems of the lower back that are caused by compensating for the injury, such as limping.  Pain or weakness of the foot during push-off following surgery.  Chronic inflammation, scarring, and partial or complete fascia tear, occurring more often from repeated injections.  TREATMENT  Treatment initially involves the use of ice and medication to help reduce pain and inflammation. The use of strengthening and stretching exercises may help reduce pain with activity, especially stretches of the Achilles tendon. These exercises may be performed at home or with a therapist. Your caregiver may recommend that you use heel cups of arch supports to help reduce stress on the plantar fascia. Occasionally, corticosteroid injections are given to reduce inflammation. If symptoms persist for greater than 6 months despite non-surgical (conservative), then surgery may be recommended.   MEDICATION   If pain medication is necessary, then nonsteroidal anti-inflammatory medications, such as aspirin and ibuprofen, or other minor pain relievers, such as acetaminophen, are often recommended.  Do not take pain medication within 7 days before surgery.  Prescription pain relievers may be  given if deemed necessary by your caregiver. Use only as directed and only as much as you need.  Corticosteroid injections may be given by your caregiver. These injections should be reserved for the most serious cases, because they may only be given a certain number of times.  HEAT AND COLD  Cold treatment (icing) relieves pain and reduces inflammation. Cold treatment should be applied for 10 to 15 minutes every 2 to 3 hours for inflammation and pain and immediately after any activity that aggravates your symptoms. Use ice packs or massage the area with a piece of ice (ice massage).  Heat treatment may be used prior to performing the stretching and strengthening activities prescribed by your caregiver, physical therapist, or athletic trainer. Use a heat pack or soak the injury in warm water.  SEEK IMMEDIATE MEDICAL CARE IF:  Treatment seems to offer no benefit, or the condition worsens.  Any medications produce adverse side effects.  EXERCISES- RANGE OF MOTION (ROM) AND STRETCHING EXERCISES - Plantar Fasciitis (Heel Spur Syndrome) These exercises may help you when beginning to rehabilitate your injury. Your symptoms may resolve with or without further involvement from your physician, physical therapist or athletic  trainer. While completing these exercises, remember:   Restoring tissue flexibility helps normal motion to return to the joints. This allows healthier, less painful movement and activity.  An effective stretch should be held for at least 30 seconds.  A stretch should never be painful. You should only feel a gentle lengthening or release in the stretched tissue.  RANGE OF MOTION - Toe Extension, Flexion  Sit with your right / left leg crossed over your opposite knee.  Grasp your toes and gently pull them back toward the top of your foot. You should feel a stretch on the bottom of your toes and/or foot.  Hold this stretch for 10 seconds.  Now, gently pull your toes toward  the bottom of your foot. You should feel a stretch on the top of your toes and or foot.  Hold this stretch for 10 seconds. Repeat  times. Complete this stretch 3 times per day.   RANGE OF MOTION - Ankle Dorsiflexion, Active Assisted  Remove shoes and sit on a chair that is preferably not on a carpeted surface.  Place right / left foot under knee. Extend your opposite leg for support.  Keeping your heel down, slide your right / left foot back toward the chair until you feel a stretch at your ankle or calf. If you do not feel a stretch, slide your bottom forward to the edge of the chair, while still keeping your heel down.  Hold this stretch for 10 seconds. Repeat 3 times. Complete this stretch 2 times per day.   STRETCH  Gastroc, Standing  Place hands on wall.  Extend right / left leg, keeping the front knee somewhat bent.  Slightly point your toes inward on your back foot.  Keeping your right / left heel on the floor and your knee straight, shift your weight toward the wall, not allowing your back to arch.  You should feel a gentle stretch in the right / left calf. Hold this position for 10 seconds. Repeat 3 times. Complete this stretch 2 times per day.  STRETCH  Soleus, Standing  Place hands on wall.  Extend right / left leg, keeping the other knee somewhat bent.  Slightly point your toes inward on your back foot.  Keep your right / left heel on the floor, bend your back knee, and slightly shift your weight over the back leg so that you feel a gentle stretch deep in your back calf.  Hold this position for 10 seconds. Repeat 3 times. Complete this stretch 2 times per day.  STRETCH  Gastrocsoleus, Standing  Note: This exercise can place a lot of stress on your foot and ankle. Please complete this exercise only if specifically instructed by your caregiver.   Place the ball of your right / left foot on a step, keeping your other foot firmly on the same step.  Hold on to  the wall or a rail for balance.  Slowly lift your other foot, allowing your body weight to press your heel down over the edge of the step.  You should feel a stretch in your right / left calf.  Hold this position for 10 seconds.  Repeat this exercise with a slight bend in your right / left knee. Repeat 3 times. Complete this stretch 2 times per day.   STRENGTHENING EXERCISES - Plantar Fasciitis (Heel Spur Syndrome)  These exercises may help you when beginning to rehabilitate your injury. They may resolve your symptoms with or without further involvement from your  physician, physical therapist or athletic trainer. While completing these exercises, remember:   Muscles can gain both the endurance and the strength needed for everyday activities through controlled exercises.  Complete these exercises as instructed by your physician, physical therapist or athletic trainer. Progress the resistance and repetitions only as guided.  STRENGTH - Towel Curls  Sit in a chair positioned on a non-carpeted surface.  Place your foot on a towel, keeping your heel on the floor.  Pull the towel toward your heel by only curling your toes. Keep your heel on the floor. Repeat 3 times. Complete this exercise 2 times per day.  STRENGTH - Ankle Inversion  Secure one end of a rubber exercise band/tubing to a fixed object (table, pole). Loop the other end around your foot just before your toes.  Place your fists between your knees. This will focus your strengthening at your ankle.  Slowly, pull your big toe up and in, making sure the band/tubing is positioned to resist the entire motion.  Hold this position for 10 seconds.  Have your muscles resist the band/tubing as it slowly pulls your foot back to the starting position. Repeat 3 times. Complete this exercises 2 times per day.  Document Released: 04/19/2005 Document Revised: 07/12/2011 Document Reviewed: 08/01/2008 [facility] Patient Information 2014  [place], [place].

## 2017-05-17 ENCOUNTER — Ambulatory Visit
Admission: RE | Admit: 2017-05-17 | Discharge: 2017-05-17 | Disposition: A | Discharge status: Home / Self Care | Payer: Medicare Other | Source: Ambulatory Visit | Attending: Internal Medicine | Admitting: Internal Medicine

## 2017-05-17 DIAGNOSIS — Z1231 Encounter for screening mammogram for malignant neoplasm of breast: Secondary | ICD-10-CM | POA: Diagnosis not present

## 2017-05-17 DIAGNOSIS — Z780 Asymptomatic menopausal state: Secondary | ICD-10-CM | POA: Diagnosis not present

## 2017-05-17 DIAGNOSIS — E2839 Other primary ovarian failure: Secondary | ICD-10-CM

## 2017-05-18 ENCOUNTER — Other Ambulatory Visit: Payer: Self-pay | Admitting: Internal Medicine

## 2017-05-18 DIAGNOSIS — R928 Other abnormal and inconclusive findings on diagnostic imaging of breast: Secondary | ICD-10-CM

## 2017-05-23 ENCOUNTER — Ambulatory Visit: Payer: Medicare Other | Admitting: Podiatry

## 2017-05-23 ENCOUNTER — Other Ambulatory Visit: Payer: Medicare Other

## 2017-05-23 DIAGNOSIS — M1612 Unilateral primary osteoarthritis, left hip: Secondary | ICD-10-CM | POA: Diagnosis not present

## 2017-05-23 DIAGNOSIS — M25512 Pain in left shoulder: Secondary | ICD-10-CM | POA: Diagnosis not present

## 2017-05-26 ENCOUNTER — Ambulatory Visit
Admission: RE | Admit: 2017-05-26 | Discharge: 2017-05-26 | Disposition: A | Discharge status: Home / Self Care | Payer: Medicare Other | Source: Ambulatory Visit | Attending: Internal Medicine | Admitting: Internal Medicine

## 2017-05-26 DIAGNOSIS — R928 Other abnormal and inconclusive findings on diagnostic imaging of breast: Secondary | ICD-10-CM

## 2017-05-26 DIAGNOSIS — N6311 Unspecified lump in the right breast, upper outer quadrant: Secondary | ICD-10-CM | POA: Diagnosis not present

## 2017-05-27 ENCOUNTER — Telehealth: Payer: Self-pay | Admitting: *Deleted

## 2017-05-27 ENCOUNTER — Telehealth: Payer: Self-pay | Admitting: Podiatry

## 2017-05-27 DIAGNOSIS — K219 Gastro-esophageal reflux disease without esophagitis: Secondary | ICD-10-CM | POA: Diagnosis not present

## 2017-05-27 DIAGNOSIS — E039 Hypothyroidism, unspecified: Secondary | ICD-10-CM | POA: Diagnosis not present

## 2017-05-27 DIAGNOSIS — M169 Osteoarthritis of hip, unspecified: Secondary | ICD-10-CM | POA: Diagnosis not present

## 2017-05-27 DIAGNOSIS — I10 Essential (primary) hypertension: Secondary | ICD-10-CM | POA: Diagnosis not present

## 2017-05-27 NOTE — Telephone Encounter (Signed)
Cheryl Castro [employer] states pt is in office and they need her last office notes.

## 2017-05-27 NOTE — Telephone Encounter (Signed)
This is Research officer, trade unionJanelle a Engineer, civil (consulting)nurse at [facility]. We are needing the last office visit notes on Mrs. Cheryl Castro for continuation of care. If you could please fax those to us at [phone] and if you have any questions or need anything else you can call us at [phone]. Thank you.

## 2017-05-27 NOTE — Telephone Encounter (Signed)
I faxed 05/06/2017 clinical notes from our office to Attn: [facility].

## 2017-06-07 DIAGNOSIS — H524 Presbyopia: Secondary | ICD-10-CM | POA: Diagnosis not present

## 2017-06-07 DIAGNOSIS — H25813 Combined forms of age-related cataract, bilateral: Secondary | ICD-10-CM | POA: Diagnosis not present

## 2017-06-07 DIAGNOSIS — H52223 Regular astigmatism, bilateral: Secondary | ICD-10-CM | POA: Diagnosis not present

## 2017-06-07 DIAGNOSIS — H5213 Myopia, bilateral: Secondary | ICD-10-CM | POA: Diagnosis not present

## 2017-07-05 ENCOUNTER — Other Ambulatory Visit: Payer: Self-pay | Admitting: *Deleted

## 2017-07-05 MED ORDER — PAROXETINE HCL 40 MG PO TABS: 40.0000 mg | ORAL_TABLET | Freq: Every day | ORAL | 3 refills | Status: DC

## 2017-08-16 DIAGNOSIS — H25043 Posterior subcapsular polar age-related cataract, bilateral: Secondary | ICD-10-CM | POA: Diagnosis not present

## 2017-08-16 DIAGNOSIS — I10 Essential (primary) hypertension: Secondary | ICD-10-CM | POA: Diagnosis not present

## 2017-08-16 DIAGNOSIS — H2513 Age-related nuclear cataract, bilateral: Secondary | ICD-10-CM | POA: Diagnosis not present

## 2017-08-16 DIAGNOSIS — H25013 Cortical age-related cataract, bilateral: Secondary | ICD-10-CM | POA: Diagnosis not present

## 2017-08-16 DIAGNOSIS — H2512 Age-related nuclear cataract, left eye: Secondary | ICD-10-CM | POA: Diagnosis not present

## 2017-09-23 ENCOUNTER — Encounter (HOSPITAL_COMMUNITY): Payer: Self-pay | Admitting: Emergency Medicine

## 2017-09-23 ENCOUNTER — Ambulatory Visit (HOSPITAL_COMMUNITY)
Admission: EM | Admit: 2017-09-23 | Discharge: 2017-09-23 | Disposition: A | Discharge status: Home / Self Care | Payer: Medicare Other | Attending: Family Medicine | Admitting: Family Medicine

## 2017-09-23 DIAGNOSIS — K0889 Other specified disorders of teeth and supporting structures: Secondary | ICD-10-CM | POA: Diagnosis not present

## 2017-09-23 DIAGNOSIS — R1013 Epigastric pain: Secondary | ICD-10-CM

## 2017-09-23 DIAGNOSIS — R0789 Other chest pain: Secondary | ICD-10-CM

## 2017-09-23 MED ORDER — OMEPRAZOLE 40 MG PO CPDR: 40.0000 mg | DELAYED_RELEASE_CAPSULE | Freq: Every day | ORAL | 1 refills | Status: DC

## 2017-09-23 MED ORDER — IBUPROFEN 800 MG PO TABS: 800.0000 mg | ORAL_TABLET | Freq: Three times a day (TID) | ORAL | 0 refills | Status: DC | PRN

## 2017-09-23 MED ORDER — PENICILLIN V POTASSIUM 500 MG PO TABS: 500.0000 mg | ORAL_TABLET | Freq: Four times a day (QID) | ORAL | 0 refills | Status: AC

## 2017-09-23 NOTE — ED Provider Notes (Signed)
[facility]    CSN: [id number] Arrival date & time: 09/23/17  1001     History   Chief Complaint Chief Complaint  Patient presents with  . Dental Pain  . Chest Pain    HPI Cheryl Castro is a 67 y.o. female.   HPI  Here for two problems Pain under L ribs for 4 weeks Comes and goes Wakes her at night Some nausea Used to be on aciphex for GERD Stopped for unknown reason No pain with breathing or exertion  Also has dental pain and pain in the right face Known dental problems but slow to get to an dentist.  Gums are red.  Past Medical History:  Diagnosis Date  . Anxiety   . GERD (gastroesophageal reflux disease)   . Glucose intolerance (impaired glucose tolerance)   . Hiatal hernia   . Hypertension   . Hypothyroidism   . Sleep apnea    no longer using CPAP    Patient Active Problem List   Diagnosis Date Noted  . 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 Numbness and tingling in left arm 02/17/2016  . Chest pain with low risk for cardiac etiology 02/05/2016  . Gastroesophageal reflux disease   . Pain in joint, ankle and foot 05/01/2015  . Healthcare maintenance 10/09/2014  . Palpitations 10/09/2014  . Bipolar II disorder (HCC) 01/30/2013  . Generalized anxiety disorder 01/02/2013  . Reflux gastritis 11/22/2011  . Hypothyroidism 04/10/2010  . PELVIC PAIN, CHRONIC 04/02/2010  . Obesity 02/25/2010  . Depression 12/30/2009  . Pruritus of skin 07/28/2009  . BACK PAIN, CHRONIC 07/28/2009  . Essential hypertension, benign 06/24/2009    Past Surgical History:  Procedure Laterality Date  . CARDIAC CATHETERIZATION N/A 02/27/2016   Procedure: Left Heart Cath and Coronary Angiography;  Surgeon: Corky Crafts, MD;  Location: [facility] INVASIVE CV LAB;  Service: Cardiovascular;  Laterality: N/A;    OB History   None      Home Medications    Prior to Admission medications   Medication Sig Start Date End Date Taking?  Authorizing Provider  aspirin EC 325 MG tablet Take 325 mg by mouth daily.    [provider]  aspirin EC 81 MG tablet Take 1 tablet (81 mg total) by mouth daily. 02/24/16   Tereso Newcomer T, PA-C  aspirin-acetaminophen-caffeine (EXCEDRIN MIGRAINE) [phone] MG tablet Take 2 tablets by mouth 2 (two) times daily as needed for headache.    [provider]  atenolol (TENORMIN) 50 MG tablet Take 1 tablet (50 mg total) by mouth 2 (two) times daily. 02/28/17   Beaulah Dinning, MD  diphenhydrAMINE (BENADRYL) 25 MG tablet Take 12.5 mg by mouth at bedtime.    [provider]  ibuprofen (ADVIL,MOTRIN) 800 MG tablet Take 1 tablet (800 mg total) by mouth every 8 (eight) hours as needed for moderate pain. 09/23/17   Eustace Moore, MD  levothyroxine (SYNTHROID, LEVOTHROID) 175 MCG tablet Take 1 tablet (175 mcg total) by mouth daily. 10/20/16   Howard Pouch, MD  nitroGLYCERIN (NITROSTAT) 0.4 MG SL tablet Place 1 tablet (0.4 mg total) under the tongue every 5 (five) minutes as needed. Patient taking differently: Place 0.4 mg under the tongue every 5 (five) minutes as needed for chest pain.  02/24/16   Tereso Newcomer T, PA-C  omeprazole (PRILOSEC) 40 MG capsule Take 1 capsule (40 mg total) by mouth daily. 09/23/17   Eustace Moore, MD  PARoxetine (PAXIL) 40 MG tablet  Take 1 tablet (40 mg total) by mouth daily. 07/05/17   Howard Pouch, MD  penicillin v potassium (VEETID) 500 MG tablet Take 1 tablet (500 mg total) by mouth 4 (four) times daily for 10 days. 09/23/17 10/03/17  Eustace Moore, MD    Family History Family History  Problem Relation Age of Onset  . Diabetes Other   . Hypertension Other   . CAD Other   . Heart attack Father        died of MI age 61  . Coronary artery disease Brother     Social History Social History   Tobacco Use  . Smoking status: Former Games developer  . Smokeless tobacco: Never Used  Substance Use Topics  . Alcohol use: No  . Drug use: No      Allergies   Patient has no known allergies.   Review of Systems Review of Systems   Physical Exam Triage Vital Signs ED Triage Vitals [09/23/17 1015]  Enc Vitals Group     BP (!) 148/80     Pulse Rate 63     Resp 18     Temp 98.4 F (36.9 C)     Temp Source Oral     SpO2 98 %     Weight      Height      Head Circumference      Peak Flow      Pain Score      Pain Loc      Pain Edu?      Excl. in GC?    No data found.  Updated Vital Signs BP (!) 148/80 (BP Location: Left Arm) Comment: reported BP to Nurse Cornelius Moras  Pulse 63   Temp 98.4 F (36.9 C) (Oral)   Resp 18   SpO2 98%   Visual Acuity Right Eye Distance:   Left Eye Distance:   Bilateral Distance:    Right Eye Near:   Left Eye Near:    Bilateral Near:     Physical Exam  Constitutional: She appears well-developed and well-nourished. No distress.  HENT:  Head: Normocephalic and atraumatic.  Right Ear: Hearing, tympanic membrane and ear canal normal.  Left Ear: Hearing, tympanic membrane and ear canal normal.  Mouth/Throat: Oropharynx is clear and moist and mucous membranes are normal.    Eyes: Pupils are equal, round, and reactive to light. Conjunctivae are normal.  Neck: Normal range of motion.  Cardiovascular: Normal rate and regular rhythm.    No systolic murmur is present. Pulmonary/Chest: Effort normal and breath sounds normal. No respiratory distress.  No chest wall tenderness  Abdominal: She exhibits no distension. There is tenderness.  Mild epigastric  Musculoskeletal: Normal range of motion. She exhibits no edema.  Lymphadenopathy:    She has no cervical adenopathy.  Neurological: She is alert.  Skin: Skin is warm and dry.  Psychiatric: She has a normal mood and affect.     UC Treatments / Results  Labs (all labs ordered are listed, but only abnormal results are displayed) Labs Reviewed - No data to display  EKG None  Radiology No results  found.  Procedures Procedures (including critical care time)  Medications Ordered in UC Medications - No data to display  Initial Impression / Assessment and Plan / UC Course  I have reviewed the triage vital signs and the nursing notes.  Pertinent labs & imaging results that were available during my care of the patient were reviewed by me and considered in  my medical decision making (see chart for details).      Final Clinical Impressions(s) / UC Diagnoses   Final diagnoses:  Pain, dental  Chest wall pain  Abdominal pain, epigastric     Discharge Instructions     Take the penicillin 4 times a day for dental pain.  Take 5 days initially, save the other 5 days in case the pain comes back. See a dentist as soon as you are able to schedule an appointment  Take the ibuprofen 800 mg 3 times a day with food.  This is for pain and inflammation Take the omeprazole 40 mg a day once a day.  Take on an empty stomach.  Follow-up with your primary care doctor   ED Prescriptions    Medication Sig Dispense Auth. Provider   ibuprofen (ADVIL,MOTRIN) 800 MG tablet Take 1 tablet (800 mg total) by mouth every 8 (eight) hours as needed for moderate pain. 90 tablet Eustace Moore, MD   penicillin v potassium (VEETID) 500 MG tablet Take 1 tablet (500 mg total) by mouth 4 (four) times daily for 10 days. 40 tablet Eustace Moore, MD   omeprazole (PRILOSEC) 40 MG capsule Take 1 capsule (40 mg total) by mouth daily. 30 capsule Eustace Moore, MD     Controlled Substance Prescriptions [place] Controlled Substance Registry consulted? Not Applicable   Eustace Moore, MD 09/23/17 1136

## 2017-09-23 NOTE — ED Triage Notes (Signed)
Pt c/o R tooth pain. Pt also states its painful underneath her L breast. Pt states pain is worse with movement, lying on a pillow on that side helps with the pain.

## 2017-09-23 NOTE — Discharge Instructions (Addendum)
Take the penicillin 4 times a day for dental pain.  Take 5 days initially, save the other 5 days in case the pain comes back. See a dentist as soon as you are able to schedule an appointment  Take the ibuprofen 800 mg 3 times a day with food.  This is for pain and inflammation Take the omeprazole 40 mg a day once a day.  Take on an empty stomach.  Follow-up with your primary care doctor

## 2017-10-10 DIAGNOSIS — Z961 Presence of intraocular lens: Secondary | ICD-10-CM | POA: Diagnosis not present

## 2017-10-10 DIAGNOSIS — H2512 Age-related nuclear cataract, left eye: Secondary | ICD-10-CM | POA: Diagnosis not present

## 2017-10-10 HISTORY — PX: CATARACT EXTRACTION: SUR2

## 2017-10-11 DIAGNOSIS — Z961 Presence of intraocular lens: Secondary | ICD-10-CM | POA: Diagnosis not present

## 2017-10-11 DIAGNOSIS — H25041 Posterior subcapsular polar age-related cataract, right eye: Secondary | ICD-10-CM | POA: Diagnosis not present

## 2017-10-11 DIAGNOSIS — H25011 Cortical age-related cataract, right eye: Secondary | ICD-10-CM | POA: Diagnosis not present

## 2017-10-11 DIAGNOSIS — H2511 Age-related nuclear cataract, right eye: Secondary | ICD-10-CM | POA: Diagnosis not present

## 2017-10-24 DIAGNOSIS — H2511 Age-related nuclear cataract, right eye: Secondary | ICD-10-CM | POA: Diagnosis not present

## 2017-11-21 ENCOUNTER — Ambulatory Visit: Payer: Medicare Other | Admitting: Podiatry

## 2017-12-02 ENCOUNTER — Ambulatory Visit (HOSPITAL_COMMUNITY)
Admission: EM | Admit: 2017-12-02 | Discharge: 2017-12-02 | Disposition: A | Discharge status: Home / Self Care | Payer: Medicare Other | Attending: Family Medicine | Admitting: Family Medicine

## 2017-12-02 ENCOUNTER — Other Ambulatory Visit: Payer: Self-pay

## 2017-12-02 ENCOUNTER — Encounter (HOSPITAL_COMMUNITY): Payer: Self-pay

## 2017-12-02 DIAGNOSIS — M722 Plantar fascial fibromatosis: Secondary | ICD-10-CM

## 2017-12-02 MED ORDER — MELOXICAM 15 MG PO TABS: 15.0000 mg | ORAL_TABLET | Freq: Every day | ORAL | 1 refills | Status: DC

## 2017-12-02 NOTE — ED Provider Notes (Signed)
[facility]    CSN: [id number] Arrival date & time: 12/02/17  1854     History   Chief Complaint Chief Complaint  Patient presents with  . Foot Pain    HPI JOHNANNA Castro is a 67 y.o. female.   Patient presents with pain in her left foot primarily in the volar surface of the heel.  About 6 months ago she was treated by podiatrist with an injection and some sort of strapping for what sounds like plantar fasciitis and/or heel spur.  Symptoms improved but have now recurred.  She denies any history of diabetes lumbar disc disease or neuropathy in the foot.  HPI  Past Medical History:  Diagnosis Date  . Anxiety   . GERD (gastroesophageal reflux disease)   . Glucose intolerance (impaired glucose tolerance)   . Hiatal hernia   . Hypertension   . Hypothyroidism   . Sleep apnea    no longer using CPAP    Patient Active Problem List   Diagnosis Date Noted  . 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 Numbness and tingling in left arm 02/17/2016  . Chest pain with low risk for cardiac etiology 02/05/2016  . Gastroesophageal reflux disease   . Pain in joint, ankle and foot 05/01/2015  . Healthcare maintenance 10/09/2014  . Palpitations 10/09/2014  . Bipolar II disorder (HCC) 01/30/2013  . Generalized anxiety disorder 01/02/2013  . Reflux gastritis 11/22/2011  . Hypothyroidism 04/10/2010  . PELVIC PAIN, CHRONIC 04/02/2010  . Obesity 02/25/2010  . Depression 12/30/2009  . Pruritus of skin 07/28/2009  . BACK PAIN, CHRONIC 07/28/2009  . Essential hypertension, benign 06/24/2009    Past Surgical History:  Procedure Laterality Date  . CARDIAC CATHETERIZATION N/A 02/27/2016   Procedure: Left Heart Cath and Coronary Angiography;  Surgeon: Corky Crafts, MD;  Location: [facility] INVASIVE CV LAB;  Service: Cardiovascular;  Laterality: N/A;  . CATARACT EXTRACTION Bilateral 10/10/2017    OB History   None      Home Medications    Prior to  Admission medications   Medication Sig Start Date End Date Taking? Authorizing Provider  aspirin EC 325 MG tablet Take 325 mg by mouth daily.   Yes [provider]  atenolol (TENORMIN) 50 MG tablet Take 1 tablet (50 mg total) by mouth 2 (two) times daily. 02/28/17  Yes Beaulah Dinning, MD  levothyroxine (SYNTHROID, LEVOTHROID) 175 MCG tablet Take 1 tablet (175 mcg total) by mouth daily. 10/20/16  Yes Howard Pouch, MD  PARoxetine (PAXIL) 40 MG tablet Take 1 tablet (40 mg total) by mouth daily. 07/05/17  Yes Howard Pouch, MD  aspirin EC 81 MG tablet Take 1 tablet (81 mg total) by mouth daily. 02/24/16   Tereso Newcomer T, PA-C  aspirin-acetaminophen-caffeine (EXCEDRIN MIGRAINE) [phone] MG tablet Take 2 tablets by mouth 2 (two) times daily as needed for headache.    [provider]  diphenhydrAMINE (BENADRYL) 25 MG tablet Take 12.5 mg by mouth at bedtime.    [provider]  ibuprofen (ADVIL,MOTRIN) 800 MG tablet Take 1 tablet (800 mg total) by mouth every 8 (eight) hours as needed for moderate pain. 09/23/17   Eustace Moore, MD  nitroGLYCERIN (NITROSTAT) 0.4 MG SL tablet Place 1 tablet (0.4 mg total) under the tongue every 5 (five) minutes as needed. Patient taking differently: Place 0.4 mg under the tongue every 5 (five) minutes as needed for chest pain.  02/24/16   Tereso Newcomer T, PA-C  omeprazole (  PRILOSEC) 40 MG capsule Take 1 capsule (40 mg total) by mouth daily. 09/23/17   Eustace Moore, MD    Family History Family History  Problem Relation Age of Onset  . Diabetes Other   . Hypertension Other   . CAD Other   . Heart attack Father        died of MI age 103  . Coronary artery disease Brother     Social History Social History   Tobacco Use  . Smoking status: Former Games developer  . Smokeless tobacco: Never Used  Substance Use Topics  . Alcohol use: No  . Drug use: No     Allergies   Patient has no known allergies.   Review of Systems Review  of Systems  Constitutional: Negative.  Negative for chills and fever.  HENT: Negative for ear pain and sore throat.   Eyes: Negative for pain and visual disturbance.  Respiratory: Negative for cough and shortness of breath.   Cardiovascular: Negative for chest pain and palpitations.  Gastrointestinal: Negative for abdominal pain and vomiting.  Genitourinary: Negative for dysuria and hematuria.  Musculoskeletal: Positive for gait problem. Negative for arthralgias and back pain.       Left foot pain  Skin: Negative for color change and rash.  Neurological: Negative for seizures and syncope.  All other systems reviewed and are negative.    Physical Exam Triage Vital Signs ED Triage Vitals  Enc Vitals Group     BP 12/02/17 1934 (!) 156/82     Pulse Rate 12/02/17 1934 68     Resp 12/02/17 1934 16     Temp 12/02/17 1934 97.8 F (36.6 C)     Temp Source 12/02/17 1934 Oral     SpO2 12/02/17 1934 98 %     Weight --      Height --      Head Circumference --      Peak Flow --      Pain Score 12/02/17 1935 8     Pain Loc --      Pain Edu? --      Excl. in GC? --    No data found.  Updated Vital Signs BP (!) 156/82 (BP Location: Left Arm)   Pulse 68   Temp 97.8 F (36.6 C) (Oral)   Resp 16   SpO2 98%   Visual Acuity Right Eye Distance:   Left Eye Distance:   Bilateral Distance:    Right Eye Near:   Left Eye Near:    Bilateral Near:     Physical Exam  Constitutional: She appears well-developed and well-nourished.  Musculoskeletal:  There is specific tenderness the posterior aspect of the left foot.  No pain in the Achilles tendon or retrocalcaneal bursa.  No pain in the forefoot.     UC Treatments / Results  Labs (all labs ordered are listed, but only abnormal results are displayed) Labs Reviewed - No data to display  EKG None  Radiology No results found.  Procedures Procedures (including critical care time)  Medications Ordered in UC Medications - No  data to display  Initial Impression / Assessment and Plan / UC Course  I have reviewed the triage vital signs and the nursing notes.  Pertinent labs & imaging results that were available during my care of the patient were reviewed by me and considered in my medical decision making (see chart for details).      Final Clinical Impressions(s) / UC Diagnoses  Foot pain,  secondary to plantar fasciitis and/or heel spur.  We discussed treatment options such as injection or anti-inflammatory with a heel protector (Tuli cup.  Also suggested stretching exercises with rolling pin or other round object. Final diagnoses:  None   Discharge Instructions   None    ED Prescriptions    None     Controlled Substance Prescriptions [place] Controlled Substance Registry consulted? No   Frederica KusterMiller, Elspeth Blucher M, MD 12/02/17 [id number]

## 2017-12-02 NOTE — ED Triage Notes (Signed)
[facility], pt complains of sharp, throbbing, and tingling pain

## 2017-12-02 NOTE — Discharge Instructions (Signed)
Recommend Tuli cup Recommend stretching with rolling foot over rolling panel can twice daily

## 2018-02-28 ENCOUNTER — Ambulatory Visit (HOSPITAL_COMMUNITY)
Admission: EM | Admit: 2018-02-28 | Discharge: 2018-02-28 | Disposition: A | Discharge status: Home / Self Care | Payer: Medicare Other | Attending: Family Medicine | Admitting: Family Medicine

## 2018-02-28 ENCOUNTER — Encounter (HOSPITAL_COMMUNITY): Payer: Self-pay | Admitting: Emergency Medicine

## 2018-02-28 ENCOUNTER — Other Ambulatory Visit: Payer: Self-pay

## 2018-02-28 DIAGNOSIS — I10 Essential (primary) hypertension: Secondary | ICD-10-CM | POA: Insufficient documentation

## 2018-02-28 DIAGNOSIS — F3181 Bipolar II disorder: Secondary | ICD-10-CM | POA: Diagnosis not present

## 2018-02-28 DIAGNOSIS — G4730 Sleep apnea, unspecified: Secondary | ICD-10-CM | POA: Insufficient documentation

## 2018-02-28 DIAGNOSIS — R002 Palpitations: Secondary | ICD-10-CM | POA: Insufficient documentation

## 2018-02-28 DIAGNOSIS — E669 Obesity, unspecified: Secondary | ICD-10-CM | POA: Diagnosis not present

## 2018-02-28 DIAGNOSIS — K219 Gastro-esophageal reflux disease without esophagitis: Secondary | ICD-10-CM | POA: Diagnosis not present

## 2018-02-28 DIAGNOSIS — Z7982 Long term (current) use of aspirin: Secondary | ICD-10-CM | POA: Insufficient documentation

## 2018-02-28 DIAGNOSIS — Z8249 Family history of ischemic heart disease and other diseases of the circulatory system: Secondary | ICD-10-CM | POA: Insufficient documentation

## 2018-02-28 DIAGNOSIS — F411 Generalized anxiety disorder: Secondary | ICD-10-CM | POA: Insufficient documentation

## 2018-02-28 DIAGNOSIS — Z79890 Hormone replacement therapy (postmenopausal): Secondary | ICD-10-CM | POA: Diagnosis not present

## 2018-02-28 DIAGNOSIS — F419 Anxiety disorder, unspecified: Secondary | ICD-10-CM | POA: Diagnosis not present

## 2018-02-28 DIAGNOSIS — Z87891 Personal history of nicotine dependence: Secondary | ICD-10-CM | POA: Insufficient documentation

## 2018-02-28 DIAGNOSIS — I2510 Atherosclerotic heart disease of native coronary artery without angina pectoris: Secondary | ICD-10-CM | POA: Diagnosis not present

## 2018-02-28 DIAGNOSIS — E039 Hypothyroidism, unspecified: Secondary | ICD-10-CM | POA: Insufficient documentation

## 2018-02-28 DIAGNOSIS — Z79899 Other long term (current) drug therapy: Secondary | ICD-10-CM | POA: Diagnosis not present

## 2018-02-28 LAB — POCT I-STAT, CHEM 8
BUN: 17 mg/dL (ref 8–23)
CALCIUM ION: 1.1 mmol/L — AB (ref 1.15–1.40)
Chloride: 97 mmol/L — ABNORMAL LOW (ref 98–111)
Creatinine, Ser: 0.9 mg/dL (ref 0.44–1.00)
GLUCOSE: 97 mg/dL (ref 70–99)
HCT: 45 % (ref 36.0–46.0)
Hemoglobin: 15.3 g/dL — ABNORMAL HIGH (ref 12.0–15.0)
Potassium: 4.4 mmol/L (ref 3.5–5.1)
SODIUM: 134 mmol/L — AB (ref 135–145)
TCO2: 28 mmol/L (ref 22–32)

## 2018-02-28 LAB — TSH: TSH: 5.521 u[IU]/mL — AB (ref 0.350–4.500)

## 2018-02-28 MED ORDER — HYDROXYZINE HCL 25 MG PO TABS: 25.0000 mg | ORAL_TABLET | Freq: Four times a day (QID) | ORAL | 0 refills | Status: DC

## 2018-02-28 NOTE — Discharge Instructions (Signed)
Please follow up with primary care for further evaluation of anxiety, murmur and palpitations I recommend follow up with cardiology for further eval of you murmur  Use hydroxyzine every 6-8 hours as needed for anxiety This may cause drowsiness, be careful not to use when needing to drive  Follow up here or in Ed if developing worsening chest discomfort, rapid heart rate, shortness of breath, dizziness or lightheadedness

## 2018-02-28 NOTE — ED Triage Notes (Signed)
Pt sts palpitations x several days; pt tearful

## 2018-03-01 ENCOUNTER — Telehealth (HOSPITAL_COMMUNITY): Payer: Self-pay | Admitting: Emergency Medicine

## 2018-03-01 NOTE — ED Provider Notes (Signed)
[facility]    CSN: [id number] Arrival date & time: 02/28/18  1209     History   Chief Complaint Chief Complaint  Patient presents with  . Palpitations    HPI Cheryl Castro is a 67 y.o. female history of anxiety, GERD, hypertension, hypothyroid presenting today for evaluation of a rapid heart rate.  Patient states that over the past couple weeks she has felt like her heart has been stronger than normal and occasionally fast.  When this happens she feels jittery and anxious.  She has some associated nausea, but denies vomiting.  Patient states that she is on Synthroid for hypothyroidism, but she has not had a medication change in a while.  She does not have a current PCP.  She is also on fluoxetine for anxiety.  She states that she does drink diet Pepsi frequently, but no other caffeine.  Denies herbal supplements.  Denies chest pain or shortness of breath.  Denies left arm pain.  She has felt the symptoms daily of recently.  Denies lightheaded numbness or dizziness.  HPI  Past Medical History:  Diagnosis Date  . Anxiety   . GERD (gastroesophageal reflux disease)   . Glucose intolerance (impaired glucose tolerance)   . Hiatal hernia   . Hypertension   . Hypothyroidism   . Sleep apnea    no longer using CPAP    Patient Active Problem List   Diagnosis Date Noted  . 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 Numbness and tingling in left arm 02/17/2016  . Chest pain with low risk for cardiac etiology 02/05/2016  . Gastroesophageal reflux disease   . Pain in joint, ankle and foot 05/01/2015  . Healthcare maintenance 10/09/2014  . Palpitations 10/09/2014  . Bipolar II disorder (HCC) 01/30/2013  . Generalized anxiety disorder 01/02/2013  . Reflux gastritis 11/22/2011  . Hypothyroidism 04/10/2010  . PELVIC PAIN, CHRONIC 04/02/2010  . Obesity 02/25/2010  . Depression 12/30/2009  . Pruritus of skin 07/28/2009  . BACK PAIN, CHRONIC  07/28/2009  . Essential hypertension, benign 06/24/2009    Past Surgical History:  Procedure Laterality Date  . CARDIAC CATHETERIZATION N/A 02/27/2016   Procedure: Left Heart Cath and Coronary Angiography;  Surgeon: Corky Crafts, MD;  Location: [facility] INVASIVE CV LAB;  Service: Cardiovascular;  Laterality: N/A;  . CATARACT EXTRACTION Bilateral 10/10/2017    OB History   None      Home Medications    Prior to Admission medications   Medication Sig Start Date End Date Taking? Authorizing Provider  aspirin EC 325 MG tablet Take 325 mg by mouth daily.    [provider]  aspirin EC 81 MG tablet Take 1 tablet (81 mg total) by mouth daily. 02/24/16   Tereso Newcomer T, PA-C  aspirin-acetaminophen-caffeine (EXCEDRIN MIGRAINE) [phone] MG tablet Take 2 tablets by mouth 2 (two) times daily as needed for headache.    [provider]  atenolol (TENORMIN) 50 MG tablet Take 1 tablet (50 mg total) by mouth 2 (two) times daily. 02/28/17   Beaulah Dinning, MD  diphenhydrAMINE (BENADRYL) 25 MG tablet Take 12.5 mg by mouth at bedtime.    [provider]  hydrOXYzine (ATARAX/VISTARIL) 25 MG tablet Take 1 tablet (25 mg total) by mouth every 6 (six) hours. 02/28/18   Tamlyn Sides C, PA-C  ibuprofen (ADVIL,MOTRIN) 800 MG tablet Take 1 tablet (800 mg total) by mouth every 8 (eight) hours as needed for moderate pain. 09/23/17   Delton See,  Letta Pate, MD  levothyroxine (SYNTHROID, LEVOTHROID) 175 MCG tablet Take 1 tablet (175 mcg total) by mouth daily. 10/20/16   Howard Pouch, MD  meloxicam (MOBIC) 15 MG tablet Take 1 tablet (15 mg total) by mouth daily. 12/02/17   Frederica Kuster, MD  nitroGLYCERIN (NITROSTAT) 0.4 MG SL tablet Place 1 tablet (0.4 mg total) under the tongue every 5 (five) minutes as needed. Patient taking differently: Place 0.4 mg under the tongue every 5 (five) minutes as needed for chest pain.  02/24/16   Tereso Newcomer T, PA-C  omeprazole (PRILOSEC) 40 MG  capsule Take 1 capsule (40 mg total) by mouth daily. 09/23/17   Eustace Moore, MD  PARoxetine (PAXIL) 40 MG tablet Take 1 tablet (40 mg total) by mouth daily. 07/05/17   Howard Pouch, MD    Family History Family History  Problem Relation Age of Onset  . Diabetes Other   . Hypertension Other   . CAD Other   . Heart attack Father        died of MI age 26  . Coronary artery disease Brother     Social History Social History   Tobacco Use  . Smoking status: Former Games developer  . Smokeless tobacco: Never Used  Substance Use Topics  . Alcohol use: No  . Drug use: No     Allergies   Patient has no known allergies.   Review of Systems Review of Systems  Constitutional: Negative for fatigue and fever.  HENT: Negative for congestion, sinus pressure and sore throat.   Eyes: Negative for photophobia, pain and visual disturbance.  Respiratory: Negative for cough and shortness of breath.   Cardiovascular: Positive for palpitations. Negative for chest pain and leg swelling.  Gastrointestinal: Negative for abdominal pain, nausea and vomiting.  Genitourinary: Negative for decreased urine volume and hematuria.  Musculoskeletal: Negative for myalgias, neck pain and neck stiffness.  Neurological: Negative for dizziness, syncope, facial asymmetry, speech difficulty, weakness, light-headedness, numbness and headaches.  Psychiatric/Behavioral: The patient is nervous/anxious.      Physical Exam Triage Vital Signs ED Triage Vitals  Enc Vitals Group     BP 02/28/18 1227 (!) 168/94     Pulse Rate 02/28/18 1227 68     Resp 02/28/18 1227 18     Temp 02/28/18 1227 97.9 F (36.6 C)     Temp Source 02/28/18 1227 Oral     SpO2 02/28/18 1227 100 %     Weight --      Height --      Head Circumference --      Peak Flow --      Pain Score 02/28/18 1228 0     Pain Loc --      Pain Edu? --      Excl. in GC? --    No data found.  Updated Vital Signs BP (!) 168/94 (BP Location: Right Arm)    Pulse 68   Temp 97.9 F (36.6 C) (Oral)   Resp 18   SpO2 100%   Visual Acuity Right Eye Distance:   Left Eye Distance:   Bilateral Distance:    Right Eye Near:   Left Eye Near:    Bilateral Near:     Physical Exam  Constitutional: She is oriented to person, place, and time. She appears well-developed and well-nourished. No distress.  HENT:  Head: Normocephalic and atraumatic.  Eyes: Pupils are equal, round, and reactive to light. Conjunctivae and EOM are normal.  Neck: Neck supple.  Cardiovascular: Normal rate and regular rhythm.  Murmur heard. Holosystolic murmur heard best over right second rib space  Pulmonary/Chest: Effort normal and breath sounds normal. No respiratory distress.  Breathing comfortably at rest, CTABL, no wheezing, rales or other adventitious sounds auscultated  Abdominal: Soft. There is no tenderness.  Musculoskeletal: She exhibits no edema.  Neurological: She is alert and oriented to person, place, and time. She displays normal reflexes. No cranial nerve deficit. Coordination normal.  Patient A&O x3, cranial nerves II-XII grossly intact, strength at shoulders, hips and knees 5/5, equal bilaterally, patellar reflex 2+ bilaterally.  Gait without abnormality.  Skin: Skin is warm and dry.  Psychiatric: She has a normal mood and affect.  Nursing note and vitals reviewed.    UC Treatments / Results  Labs (all labs ordered are listed, but only abnormal results are displayed) Labs Reviewed  TSH - Abnormal; Notable for the following components:      Result Value   TSH 5.521 (*)    All other components within normal limits  POCT I-STAT, CHEM 8 - Abnormal; Notable for the following components:   Sodium 134 (*)    Chloride 97 (*)    Calcium, Ion 1.10 (*)    Hemoglobin 15.3 (*)    All other components within normal limits    EKG None  Radiology No results found.  Procedures Procedures (including critical care time)  Medications Ordered in  UC Medications - No data to display  Initial Impression / Assessment and Plan / UC Course  I have reviewed the triage vital signs and the nursing notes.  Pertinent labs & imaging results that were available during my care of the patient were reviewed by me and considered in my medical decision making (see chart for details).     I-STAT with mild signs of dehydration, TSH slightly elevated.  EKG normal sinus rhythm, no acute signs of ischemia or infarction, arrhythmia or abnormal beats.  Symptoms likely related to anxiety.  Also advised to limit caffeine.  Provided hydroxyzine to use as needed for anxiety.  Patient is stable, symptoms have been persisting for 2 weeks off-and-on, do not feel patient warrants emergent evaluation at this time.  Recommended patient establishing care with primary care and also following up with cardiology as she has never had her murmur evaluated.  Provided primary care recommendation to establish care.  Advised to go to emergency room or return here if symptoms returning or worsening.Discussed strict return precautions. Patient verbalized understanding and is agreeable with plan.  Final Clinical Impressions(s) / UC Diagnoses   Final diagnoses:  Palpitations  Anxiety     Discharge Instructions     Please follow up with primary care for further evaluation of anxiety, murmur and palpitations I recommend follow up with cardiology for further eval of you murmur  Use hydroxyzine every 6-8 hours as needed for anxiety This may cause drowsiness, be careful not to use when needing to drive  Follow up here or in Ed if developing worsening chest discomfort, rapid heart rate, shortness of breath, dizziness or lightheadedness   ED Prescriptions    Medication Sig Dispense Auth. Provider   hydrOXYzine (ATARAX/VISTARIL) 25 MG tablet Take 1 tablet (25 mg total) by mouth every 6 (six) hours. 30 tablet Geselle Cardosa, Lillie C, PA-C     Controlled Substance Prescriptions [place]  Controlled Substance Registry consulted? Not Applicable   Lew Dawes, [place] 03/01/18 1102

## 2018-03-01 NOTE — Telephone Encounter (Signed)
Patient called and informed of slightly elevated TSH, informed patient may be slightly hypothyroid; patient plans to get established with primary care at [facility] for further work-up and evaluation.  Advised patient to return here emergency room if symptoms of palpitations/anxiety changing or worsening.  Patient verbalized understanding.

## 2018-03-01 NOTE — Telephone Encounter (Signed)
Pt called saying someone here called her yesterday and asked her to return the call.    Pt was told about her TSH level and she stated she would follow up with her new PCP.  There was not a note in the computer as to who originally called her so this may need a follow up if this is not what she was called about.  Thanks,  Harlin Heys A  This will be forwarded to Dr. Delton See and Patterson Hammersmith, PA for further review.  Contains abnormal data TSH  Order: [id number]  Status:  Final result  Visible to patient:  No (Not Released)  Next appt:  None   Ref Range & Units 1d ago  TSH 0.350 - 4.500 uIU/mL 5.521High    Comment: Performed by a 3rd Generation assay with a functional sensitivity of <=0.01 uIU/mL.  Performed at [facility], [address], [place], [place] [address]   Resulting Agency  [facility]      Specimen Collected: 02/28/18 16:55  Last Resulted: 02/28/18 18:00     Lab Flowsheet    Order Details    View Encounter    Lab and Collection Details    Routing    Result History          Other Results from 02/28/2018   Contains abnormal data I-STAT, chem 8  Order: [id number]   Status:  Final result  Visible to patient:  No (Not Released)  Next appt:  None   Ref Range & Units 1d ago  Sodium 135 - 145 mmol/L 134Low    Potassium 3.5 - 5.1 mmol/L 4.4   Chloride 98 - 111 mmol/L 97Low    BUN 8 - 23 mg/dL 17   Creatinine, Ser 9.56 - 1.00 mg/dL 3.87   Glucose, Bld 70 - 99 mg/dL 97   Calcium, Ion 5.64 - 1.40 mmol/L 1.10Low    TCO2 22 - 32 mmol/L 28   Hemoglobin 12.0 - 15.0 g/dL 15.3High    HCT 36.0 - 46.0 % 45.0   Resulting Agency  [facility]      Specimen Collected: 02/28/18 14:13  Last Resulted: 02/28/18 14:15     Lab Flowsheet    Order Details    View Encounter    Lab and Collection Details    Routing    Result History          Result Notes for TSH   Notes recorded by Eustace Moore, MD on 02/28/2018 at 9:55 PM EDT TSH was  done to evaluate palpitations. Palpitations can occur with hyperthyroidism, not hypothyroidism. This does not explain her anxiety and palpitations. It is only minimally abnormal and will not require treatment, but should be followed every 6 months to year.

## 2018-03-02 ENCOUNTER — Ambulatory Visit: Payer: Medicare Other | Admitting: Family Medicine

## 2018-03-07 ENCOUNTER — Encounter: Payer: Self-pay | Admitting: Family Medicine

## 2018-03-07 ENCOUNTER — Ambulatory Visit (INDEPENDENT_AMBULATORY_CARE_PROVIDER_SITE_OTHER): Payer: Medicare Other | Admitting: Family Medicine

## 2018-03-07 VITALS — BP 146/83 | HR 60 | Temp 97.8°F | Resp 17 | Ht 64.0 in | Wt 255.6 lb

## 2018-03-07 DIAGNOSIS — R739 Hyperglycemia, unspecified: Secondary | ICD-10-CM | POA: Diagnosis not present

## 2018-03-07 DIAGNOSIS — F411 Generalized anxiety disorder: Secondary | ICD-10-CM

## 2018-03-07 DIAGNOSIS — Z6841 Body Mass Index (BMI) 40.0 and over, adult: Secondary | ICD-10-CM

## 2018-03-07 DIAGNOSIS — E039 Hypothyroidism, unspecified: Secondary | ICD-10-CM

## 2018-03-07 DIAGNOSIS — Z7689 Persons encountering health services in other specified circumstances: Secondary | ICD-10-CM

## 2018-03-07 DIAGNOSIS — R002 Palpitations: Secondary | ICD-10-CM

## 2018-03-07 DIAGNOSIS — R011 Cardiac murmur, unspecified: Secondary | ICD-10-CM

## 2018-03-07 MED ORDER — BUSPIRONE HCL 10 MG PO TABS: 10.0000 mg | ORAL_TABLET | Freq: Two times a day (BID) | ORAL | 1 refills | Status: DC

## 2018-03-07 MED ORDER — LEVOTHYROXINE SODIUM 175 MCG PO TABS: 175.0000 ug | ORAL_TABLET | Freq: Every day | ORAL | 0 refills | Status: DC

## 2018-03-07 NOTE — Patient Instructions (Addendum)
Thank you for choosing Primary Care at [facility] to be your medical home!    Cheryl Castro was seen by Joaquin Courts, FNP today.   [name] Goins-Craigo's primary care provider is Bing Neighbors, FNP.   For the best care possible, you should try to see Joaquin Courts, FNP-C whenever you come to the clinic.   We look forward to seeing you again soon!  If you have any questions about your visit today, please call us at [phone] or feel free to reach your primary care provider via MyChart.    You will be notified of any abnormal labs or changes in medication.  Heart Murmur A heart murmur is an extra sound that is caused by chaotic blood flow. The murmur can be heard as a "hum" or "whoosh" sound when blood flows through the heart. The heart has four areas called chambers. Valves separate the upper and lower chambers from each other (tricuspid valve and mitral valve) and separate the lower chambers of the heart from pathways that lead away from the heart (aortic valve and pulmonary valve). Normally, the valves open to let blood flow through or out of your heart, and then they shut to keep the blood from flowing backward. There are two types of heart murmurs:  Innocent murmurs. Most people with this type of heart murmur do not have a heart problem. Many children have innocent heart murmurs. Your health care provider may suggest some basic testing to find out whether your murmur is an innocent murmur. If an innocent heart murmur is found, there is no need for further tests or treatment and no need to restrict activities or stop playing sports.  Abnormal murmurs. These types of murmurs can occur in children and adults. Abnormal murmurs may be a sign of a more serious heart condition, such as a heart defect present at birth (congenital defect) or heart valve disease.  What are the causes? This condition is caused by heart valves that are not working properly. In children,  abnormal heart murmurs are typically caused by congenital defects. In adults, abnormal murmurs are usually from heart valve problems caused by disease, infection, or aging. Three types of heart valve defects can cause a murmur:  Regurgitation. This is when blood leaks back through the valve in the wrong direction.  Mitral valve prolapse. This is when the mitral valve of the heart has a loose flap and does not close tightly.  Stenosis. This is when a valve does not open enough and blocks blood flow.  This condition may also be caused by:  Pregnancy.  Fever.  Overactive thyroid gland.  Anemia.  Exercise.  Rapid growth spurts (in children).  What are the signs or symptoms? Innocent murmurs do not cause symptoms, and many people with abnormal murmurs may or may not have symptoms. If symptoms do develop, they may include:  Shortness of breath.  Blue coloring of the skin, especially on the fingertips.  Chest pain.  Palpitations, or feeling a fluttering or skipped heartbeat.  Fainting.  Persistent cough.  Getting tired much faster than expected.  Swelling in the abdomen, feet, or ankles.  How is this diagnosed? This condition may be diagnosed during a routine physical or other exam. If your health care provider hears a murmur with a stethoscope, he or she will listen for:  Where the murmur is located in your heart.  How long the murmur lasts (duration).  When the murmur is heard during the heartbeat.  How  loud the murmur is. This may help the health care provider figure out what is causing the murmur.  You may be referred to a heart specialist (cardiologist). You may also have other tests, including:  Electrocardiogram (ECG or EKG). This test measures the electrical activity of your heart.  Echocardiogram. This test uses high frequency sound waves to make pictures of your heart.  MRI or chest X-ray.  Cardiac catheterization. This test looks at blood flow through  the heart.  For children and adults who have an abnormal heart murmur and want to stay active, it is important to complete testing, review test results, and receive recommendations from your health care provider. If heart disease is present, it may not be safe to play or be active. How is this treated? Heart murmurs themselves do not need treatment. In some cases, a heart murmur may go away on its own. If an underlying problem or disease is causing the murmur, you may need treatment. If treatment is needed, it will depend on the type and severity of the disease or heart problem causing the murmur. Treatment may include:  Medicine.  Surgery.  Dietary and lifestyle changes.  Follow these instructions at home:  Talk with your health care provider before participating in sports or other activities that require a lot of effort and energy (are strenuous).  Learn as much as possible about your condition and any related diseases. Ask your health care provider if you may at risk for any medical emergencies.  Talk with your health care provider about what symptoms you should look out for.  It is up to you to get your test results. Ask your health care provider, or the department that is doing the test, when your results will be ready.  Keep all follow-up visits as told by your health care provider. This is important. Contact a health care provider if:  You feel light-headed.  You are frequently short of breath.  You feel more tired than usual.  You are having a hard time keeping up with normal activities or fitness routines.  You have swelling in your ankles or feet.  You have chest pain.  You notice that your heart often beats irregularly.  You develop any new symptoms. Get help right away if:  You develop severe chest pain.  You are having trouble breathing.  You have fainting spells.  Your symptoms suddenly get worse. These symptoms may represent a serious problem that is an  emergency. Do not wait to see if the symptoms will go away. Get medical help right away. Call your local emergency services (911 in the [place] Do not drive yourself to the hospital. Summary  Normally, the heart valves open to let blood flow through or out of your heart, and then they shut to keep the blood from flowing backward.  Heart murmur is caused by heart valves that are not working properly.  You may need treatment if an underlying problem or disease is causing the heart murmur. Treatment may include medicine, surgery, or dietary and lifestyle changes.  Talk with your health care provider before participating in sports or other activities that require a lot of effort and energy (are strenuous).  Talk with your health care provider about what symptoms you should watch out for. This information is not intended to replace advice given to you by your health care provider. Make sure you discuss any questions you have with your health care provider. Document Released: 05/27/2004 Document Revised: 04/07/2016 Document Reviewed:  04/07/2016 Elsevier Interactive Patient Education  [employer].

## 2018-03-07 NOTE — Progress Notes (Signed)
Cheryl Castro, is a 67 y.o. female  ZOX:[id number]  WJX:[id number]  DOB - 08/29/1950  CC: No chief complaint on file.      HPI: Cheryl Castro is a 67 y.o. female is here today to establish care.   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    Today's visit:  Cheryl Castro presents today for a urgent care follow-up.  Patient has an extensive medical history- see problem list.  Patient presented to [facility] urgent care with complaint of chest tightness, palpitations, and increased anxiety.  She last saw her PCP over a year ago and reports that during that time Synthroid thyroid medication had been decreased from 200 mcg to 175 mcg.  Although she notes even prior to that time she has had some issues with chronic chest tightness and chest pain.  She has a history of a heart catheterization back in 2017 although notes there was no blockages found.  She reports is a heaviness sensation on the left side of her chest and the sensation of palpitations which sometimes are intense.  Recent EKG at urgent care was negative of any acute findings.  She has not had any associated shortness of breath.  Labs collected at urgent care were significant for a increase in TSH level indicating a non-therapeutic level of Synthroid.    Cheryl Castro suffers from hypertension.  She reports no home monitoring of blood pressure. Reports adherence to blood pressure medications. Reports efforts to adhere to low sodium diet.  At present she does not engage in routine physical activity. She is a nonsmoker.  Denies any episodes of dizziness, headaches, shortness of breath, or chest pain.  She has been followed by cardiology several years ago after her heart catheterization although reports she is not cardiologist since that time.   Current medications: Current Outpatient Medications:  .  aspirin EC 325 MG tablet, Take 325 mg by mouth daily., Disp: , Rfl:  .  aspirin EC 81 MG tablet, Take 1 tablet (81 mg total) by mouth daily., Disp: , Rfl:  .  aspirin-acetaminophen-caffeine (EXCEDRIN MIGRAINE) 250-250-65 MG tablet, Take 2 tablets by mouth 2 (two) times daily as needed for headache., Disp: , Rfl:  .  atenolol (TENORMIN) 50 MG tablet, Take 1 tablet (50 mg total) by mouth 2 (two) times daily., Disp: 60 tablet, Rfl: 11 .  diphenhydrAMINE (BENADRYL) 25 MG tablet, Take 12.5 mg by mouth at bedtime., Disp: , Rfl:  .  hydrOXYzine (ATARAX/VISTARIL) 25 MG tablet, Take 1 tablet (25 mg total) by mouth every 6 (six) hours., Disp: 30 tablet, Rfl: 0 .  ibuprofen (ADVIL,MOTRIN) 800 MG tablet, Take 1 tablet (800 mg total) by mouth every 8 (eight) hours as needed for moderate pain., Disp: 90 tablet, Rfl: 0 .  levothyroxine (SYNTHROID, LEVOTHROID) 175 MCG tablet, Take 1 tablet (175 mcg total) by mouth daily., Disp: 90 tablet, Rfl: 3 .  meloxicam (MOBIC) 15 MG tablet, Take 1 tablet (15 mg total) by mouth daily., Disp: 15 tablet, Rfl: 1 .  nitroGLYCERIN (NITROSTAT) 0.4 MG SL tablet, Place 1 tablet (0.4 mg total) under the tongue every 5 (five) minutes as needed. (Patient taking differently: Place 0.4 mg under the  tongue every 5 (five) minutes as needed for chest pain. ), Disp: 25 tablet, Rfl: 3 .  omeprazole (PRILOSEC) 40 MG capsule, Take 1 capsule (40 mg total) by mouth daily., Disp: 30 capsule, Rfl: 1 .  PARoxetine (PAXIL) 40 MG tablet, Take 1 tablet (40 mg total) by mouth daily., Disp: 90 tablet, Rfl: 3   Pertinent family medical history: family history includes CAD in her other; Coronary artery disease in her brother;  Diabetes in her other; Heart attack in her father; Hypertension in her other.   No Known Allergies  Social History   Socioeconomic History  . Marital status: Married    Spouse name: Not on file  . Number of children: Not on file  . Years of education: Not on file  . Highest education level: Not on file  Occupational History  . Not on file  Social Needs  . Financial resource strain: Not on file  . Food insecurity:    Worry: Not on file    Inability: Not on file  . Transportation needs:    Medical: Not on file    Non-medical: Not on file  Tobacco Use  . Smoking status: Former Games developer  . Smokeless tobacco: Never Used  Substance and Sexual Activity  . Alcohol use: No  . Drug use: No  . Sexual activity: Not on file  Lifestyle  . Physical activity:    Days per week: Not on file    Minutes per session: Not on file  . Stress: Not on file  Relationships  . Social connections:    Talks on phone: Not on file    Gets together: Not on file    Attends religious service: Not on file    Active member of club or organization: Not on file    Attends meetings of clubs or organizations: Not on file    Relationship status: Not on file  . Intimate partner violence:    Fear of current or ex partner: Not on file    Emotionally abused: Not on file    Physically abused: Not on file    Forced sexual activity: Not on file  Other Topics Concern  . 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 Respiratory: Negative for cough, choking, chest tightness, shortness of breath, wheezing and stridor.  Cardiovascular: Negative for chest pain, palpitations and leg swelling. Gastrointestinal: Negative for abdominal distention. Genitourinary: Negative for dysuria, urgency, frequency, hematuria, flank pain,  decreased urine volume, difficulty urinating. Musculoskeletal: Negative for back pain, joint swelling, arthralgia and gait problem. Neurological: Negative for dizziness, tremors, seizures, syncope, facial asymmetry, speech difficulty, weakness, light-headedness, numbness and headaches.  Hematological: Negative for adenopathy. Does not bruise/bleed easily. Psychiatric/Behavioral: Negative for hallucinations, behavioral problems, confusion, dysphoric mood, decreased concentration and agitation.    Objective:  There were no vitals filed for this visit.  BP Readings from Last 3 Encounters:  02/28/18 (!) 168/94  12/02/17 (!) 156/82  09/23/17 (!) 148/80    There were no vitals filed for this visit.    Physical Exam: Constitutional: Patient appears well-developed and well-nourished. No distress. HENT: Normocephalic, atraumatic, External right and left ear normal. Oropharynx is clear and moist.  Eyes: Conjunctivae and EOM are normal. PERRLA, no scleral icterus. Neck: Normal ROM. Neck supple. No JVD. No tracheal deviation.  No thyromegaly. CVS: RRR, S1/S2 +,  Grade 2 murmur auscultated, no gallops, no carotid bruit. Radial +2 BL Pulmonary: Effort and breath sounds normal, no stridor, rhonchi, wheezes, rales.  Abdominal: Soft. BS +, no distension, tenderness, rebound or guarding.  Musculoskeletal: Normal range of motion. No edema and no tenderness.  Neuro: Alert. Normal muscle tone coordination. Normal gait. BUE and BLE strength 5/5. Bilateral hand grips symmetrical. No cranial nerve deficit. Skin: Skin is warm and dry. No rash noted. Not diaphoretic. No erythema. No pallor. Psychiatric: Normal mood and affect. Behavior, judgment, thought content normal.  Lab Results (prior encounters)  Lab Results  Component Value Date   WBC 5.4 12/22/2016   HGB 15.3 (H) 02/28/2018   HCT 45.0 02/28/2018   MCV 85.4 12/22/2016   PLT 339 12/22/2016   Lab Results  Component Value Date   CREATININE 0.90  02/28/2018   BUN 17 02/28/2018   NA 134 (L) 02/28/2018   K 4.4 02/28/2018   CL 97 (L) 02/28/2018   CO2 27 12/22/2016    Lab Results  Component Value Date   HGBA1C 6.1 (H) 02/05/2016       Component Value Date/Time   CHOL 162 02/05/2016 0743   TRIG 92 02/05/2016 0743   HDL 39 (L) 02/05/2016 0743   CHOLHDL 4.2 02/05/2016 0743   VLDL 18 02/05/2016 0743   LDLCALC 105 (H) 02/05/2016 0743        Assessment and plan:  1. Hypothyroidism, unspecified type, recent TSH elevated, Repeat today. If TSH is elevated increase Levothyroxine 200 mcg. - Thyroid Panel With TSH   2. Morbid obesity with BMI of 40.0-44.9, adult (HCC) Encouraged efforts to reduce weight include engaging in physical activity as tolerated with goal of 150 minutes per week. Improve dietary choices and eat a meal regimen consistent with a Mediterranean or DASH diet. Reduce simple carbohydrates. Do not skip meals and eat healthy snacks throughout the day to avoid over-eating at dinner. Set a goal weight loss that is achievable for you. Checking A1C   3. Palpitations, patient hx of heart cath without intervention. Given recent symptoms of chest heaviness, palpitations, and hx of angina, along with multiple risk factors for CAD, referring patient to cardiology for further evaluation.  -CBC with Differential, rule out anemia and  Ambulatory referral to Cardiology  4. Encounter to establish care   5. Undiagnosed cardiac murmurs, audible grade 2 murmur noted near  3rd and 4th ICS  - Ambulatory referral to Cardiology  6. hx of prediabetes -repeat  Hemoglobin A1c  7. GAD, will add Buspar twice daily to decreases anxiousness. Currently compliant with SSRI therapy and will add Buspar as adjunctive therapy.   Meds ordered this encounter  Medications  . busPIRone (BUSPAR) 10 MG tablet    Sig: Take 1 tablet (10 mg total) by mouth 2 (two) times daily.    Dispense:  60 tablet    Refill:  1  . DISCONTD: levothyroxine  (SYNTHROID, LEVOTHROID) 175 MCG tablet    Sig: Take 1 tablet (175 mcg total) by mouth daily before breakfast.    Dispense:  5 tablet    Refill:  0  . levothyroxine (SYNTHROID, LEVOTHROID) 200 MCG tablet    Sig: Take 1 tablet (200 mcg total) by mouth daily before breakfast.    Dispense:  90 tablet    Refill:  1    Orders Placed This Encounter  Procedures  . Thyroid Panel With TSH  . CBC with Differential  . Hemoglobin  A1c  . Ambulatory referral to Cardiology    A total of spent, greater than 50 % of this time was spent counseling and coordination of care.   Return in about 1 month (around 04/06/2018), or Thyroid, palpatiation, anxiety .   The patient was given clear instructions to go to ER or return to medical center if symptoms don't improve, worsen or new problems develop. The patient verbalized understanding. The patient was advised  to call and obtain lab results if they haven't heard anything from out office within 7-10 business days.  Joaquin Courts, FNP Primary Care at [facility] [address], [place] [address] [phone]: [phone]    This note has been created with Dragon speech recognition software and Paediatric nurse. Any transcriptional errors are unintentional.

## 2018-03-08 ENCOUNTER — Telehealth: Payer: Self-pay | Admitting: Family Medicine

## 2018-03-08 LAB — THYROID PANEL WITH TSH
Free Thyroxine Index: 1.5 (ref 1.2–4.9)
T3 Uptake Ratio: 21 % — ABNORMAL LOW (ref 24–39)
T4 TOTAL: 7.2 ug/dL (ref 4.5–12.0)
TSH: 7.28 u[IU]/mL — AB (ref 0.450–4.500)

## 2018-03-08 LAB — CBC WITH DIFFERENTIAL/PLATELET
BASOS: 1 %
Basophils Absolute: 0.1 10*3/uL (ref 0.0–0.2)
EOS (ABSOLUTE): 0.1 10*3/uL (ref 0.0–0.4)
EOS: 2 %
HEMATOCRIT: 44.4 % (ref 34.0–46.6)
Hemoglobin: 14.9 g/dL (ref 11.1–15.9)
Immature Grans (Abs): 0 10*3/uL (ref 0.0–0.1)
Immature Granulocytes: 0 %
LYMPHS ABS: 1.8 10*3/uL (ref 0.7–3.1)
Lymphs: 25 %
MCH: 29.3 pg (ref 26.6–33.0)
MCHC: 33.6 g/dL (ref 31.5–35.7)
MCV: 87 fL (ref 79–97)
MONOS ABS: 0.6 10*3/uL (ref 0.1–0.9)
Monocytes: 9 %
NEUTROS ABS: 4.5 10*3/uL (ref 1.4–7.0)
Neutrophils: 63 %
Platelets: 335 10*3/uL (ref 150–450)
RBC: 5.09 x10E6/uL (ref 3.77–5.28)
RDW: 12.9 % (ref 12.3–15.4)
WBC: 7.1 10*3/uL (ref 3.4–10.8)

## 2018-03-08 LAB — HEMOGLOBIN A1C
Est. average glucose Bld gHb Est-mCnc: 117 mg/dL
Hgb A1c MFr Bld: 5.7 % — ABNORMAL HIGH (ref 4.8–5.6)

## 2018-03-08 MED ORDER — LEVOTHYROXINE SODIUM 200 MCG PO TABS: 200.0000 ug | ORAL_TABLET | Freq: Every day | ORAL | 1 refills | Status: DC

## 2018-03-08 NOTE — Telephone Encounter (Signed)
Advise patient that her thyroid panel was outside of therapeutic range. I will increase her levothyroxine to 200 mcg. I send electronically to her pharmacy. She is prediabetic although not currently at a level that requires medication management. I encourage physical activity (walking), increase vegetables and fruits -reduce bread and starchy foods. Other results were within expected limits   Joaquin Courts, FNP Primary Care at [facility] [address], [place] [address] [phone]: [phone]

## 2018-03-09 NOTE — Telephone Encounter (Signed)
Left voice mail to call back 

## 2018-03-10 ENCOUNTER — Encounter: Payer: Self-pay | Admitting: Cardiovascular Disease

## 2018-03-10 NOTE — Telephone Encounter (Signed)
Patient notified of lab results & recommendations. Expressed understanding. 

## 2018-03-11 DIAGNOSIS — Z23 Encounter for immunization: Secondary | ICD-10-CM | POA: Diagnosis not present

## 2018-03-13 ENCOUNTER — Ambulatory Visit (INDEPENDENT_AMBULATORY_CARE_PROVIDER_SITE_OTHER): Payer: Medicare Other | Admitting: Cardiovascular Disease

## 2018-03-13 ENCOUNTER — Encounter: Payer: Self-pay | Admitting: Cardiovascular Disease

## 2018-03-13 VITALS — BP 160/80 | HR 66 | Ht 64.0 in | Wt 253.6 lb

## 2018-03-13 DIAGNOSIS — R0789 Other chest pain: Secondary | ICD-10-CM

## 2018-03-13 DIAGNOSIS — R002 Palpitations: Secondary | ICD-10-CM

## 2018-03-13 DIAGNOSIS — R011 Cardiac murmur, unspecified: Secondary | ICD-10-CM | POA: Diagnosis not present

## 2018-03-13 MED ORDER — HYDROCHLOROTHIAZIDE 25 MG PO TABS: 25.0000 mg | ORAL_TABLET | Freq: Every day | ORAL | 3 refills | Status: DC

## 2018-03-13 MED ORDER — POTASSIUM CHLORIDE CRYS ER 20 MEQ PO TBCR: 20.0000 meq | EXTENDED_RELEASE_TABLET | Freq: Every day | ORAL | 3 refills | Status: DC

## 2018-03-13 NOTE — Progress Notes (Signed)
Cardiology Office Note:    Date:  03/13/2018   ID:  [name], DOB 05-Dec-1950, MRN [id number]  PCP:  Scot Jun, FNP  Cardiologist:  Mertie Moores, MD  Electrophysiologist:  None   Referring MD: Scot Jun, FNP   Problem list 1.  Essential hypertension 2.  Morbid obesity 3.  Anxiety 4.  Gastroesophageal reflux disease/hiatal hernia 5.  Hypothyroidism 6.  Systolic murmur  7.   Hx of OSA,   - does not wear her CPAP any more after losign weight   Chief Complaint  Patient presents with  . Heart Murmur         Cheryl Castro is a 67 y.o. female with a hx of  Non cardiac cp. I met her several years ago - recommended cath which did not show any  Significant  CAD  Has GERD.  Recently saw her primary DM who found a murmur A similar murmur was heard by Richardson Dopp , PA  Echo was ordered - but was not done   Eats a poor diet - eating habits are poor Eats and then goes to bed right away .   Has not been exercising .    Tries to avoid salty foods.  BP is elevated today  - in only on Atenolol for her BP    Past Medical History:  Diagnosis Date  . Anxiety   . GERD (gastroesophageal reflux disease)   . Glucose intolerance (impaired glucose tolerance)   . Hiatal hernia   . Hypertension   . Hypothyroidism   . Sleep apnea    no longer using CPAP    Past Surgical History:  Procedure Laterality Date  . CARDIAC CATHETERIZATION N/A 02/27/2016   Procedure: Left Heart Cath and Coronary Angiography;  Surgeon: Jettie Booze, MD;  Location: [facility];  Service: Cardiovascular;  Laterality: N/A;  . CATARACT EXTRACTION Bilateral 10/10/2017    Current Medications: Current Meds  Medication Sig  . aspirin-acetaminophen-caffeine (EXCEDRIN MIGRAINE) 250-250-65 MG tablet Take 2 tablets by mouth 2 (two) times daily as needed for headache.  Marland Kitchen atenolol (TENORMIN) 50 MG tablet Take 1 tablet (50 mg total) by mouth 2 (two) times daily.  .  busPIRone (BUSPAR) 10 MG tablet Take 10 mg by mouth 2 (two) times daily as needed (ANXIETY).  Marland Kitchen diphenhydrAMINE (BENADRYL) 25 MG tablet Take 12.5 mg by mouth at bedtime.  . hydrOXYzine (ATARAX/VISTARIL) 25 MG tablet Take 1 tablet (25 mg total) by mouth every 6 (six) hours.  Marland Kitchen ibuprofen (ADVIL,MOTRIN) 800 MG tablet Take 1 tablet (800 mg total) by mouth every 8 (eight) hours as needed for moderate pain.  Marland Kitchen levothyroxine (SYNTHROID, LEVOTHROID) 200 MCG tablet Take 1 tablet (200 mcg total) by mouth daily before breakfast.  . nitroGLYCERIN (NITROSTAT) 0.4 MG SL tablet Place 1 tablet (0.4 mg total) under the tongue every 5 (five) minutes as needed.  Marland Kitchen omeprazole (PRILOSEC) 40 MG capsule Take 1 capsule (40 mg total) by mouth daily.  Marland Kitchen PARoxetine (PAXIL) 40 MG tablet Take 1 tablet (40 mg total) by mouth daily.  . [DISCONTINUED] aspirin EC 325 MG tablet Take 325 mg by mouth daily.     Allergies:   Patient has no known allergies.   Social History   Socioeconomic History  . Marital status: Married    Spouse name: Not on file  . Number of children: Not on file  . Years of education: Not on file  . Highest education level: Not on  file  Occupational History  . Not on file  Social Needs  . Financial resource strain: Not on file  . Food insecurity:    Worry: Not on file    Inability: Not on file  . Transportation needs:    Medical: Not on file    Non-medical: Not on file  Tobacco Use  . Smoking status: Former Research scientist (life sciences)  . Smokeless tobacco: Never Used  Substance and Sexual Activity  . Alcohol use: No  . Drug use: No  . Sexual activity: Not on file  Lifestyle  . Physical activity:    Days per week: Not on file    Minutes per session: Not on file  . Stress: Not on file  Relationships  . Social connections:    Talks on phone: Not on file    Gets together: Not on file    Attends religious service: Not on file    Active member of club or organization: Not on file    Attends meetings of clubs  or organizations: Not on file    Relationship status: Not on file  Other Topics Concern  . 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  ROS:   Please see the history of present illness.     All other systems reviewed and are negative.  EKGs/Labs/Other Studies Reviewed:    The following studies were reviewed today:   EKG:    Recent Labs: 02/28/2018: BUN 17; Creatinine, Ser 0.90; Potassium 4.4; Sodium 134 03/07/2018: Hemoglobin 14.9; Platelets 335; TSH 7.280  Recent Lipid Panel    Component Value Date/Time   CHOL 162 02/05/2016 0743   TRIG 92 02/05/2016 0743   HDL 39 (L) 02/05/2016 0743   CHOLHDL 4.2 02/05/2016 0743   VLDL 18 02/05/2016 0743   LDLCALC 105 (H) 02/05/2016 0743   LDLDIRECT 113 (H) 11/22/2011 1703    Physical Exam:    VS:  BP (!) 160/80   Pulse 66   Ht _0  (1.626 m)   Wt 253 lb 9.6 oz (115 kg)   SpO2 96%   BMI 43.53 kg/m     Wt Readings from Last 3 Encounters:  03/13/18 253 lb 9.6 oz (115 kg)  03/07/18 255 lb 9.6 oz (115.9 kg)  12/22/16 250 lb (113.4 kg)     GEN:   Obese, middle age female,  HEENT: Normal NECK: No JVD; No carotid bruits LYMPHATICS: No lymphadenopathy CARDIAC:   RR , very soft systolic murmur  RESPIRATORY:  Clear to auscultation without rales, wheezing or rhonchi  ABDOMEN: Soft, non-tender, non-distended MUSCULOSKELETAL:  No edema; No deformity  SKIN: Warm and dry NEUROLOGIC:  Alert and oriented x 3 PSYCHIATRIC:  Normal affect   ASSESSMENT:    1. Palpitations   2. Heart murmur   3. Atypical chest pain    PLAN:    In order of problems listed above:  1. Essential hypertension/hypertensive heart disease without congestive heart failure: Tomi Bamberger presents with  hypertension.  I think that she would benefit with the addition of HCTZ 25 mg a day and potassium chloride 20 mill equivalent today.  We will have her return in 3 weeks for a basic metabolic profile.  Of advised her  to work on diet, exercise, weight loss program.  2.    Palpitations: She is had palpitations that occur almost every day.  We will have her wear a 48-hour monitor.  3.  Murmur: She was found to have a murmur by Richardson Dopp, PA to several years ago.  An echocardiogram was ordered but was never performed.  We will reorder the echocardiogram.  The murmur sounds fairly benign.   Medication Adjustments/Labs and Tests Ordered: Current medicines are reviewed at length with the patient today.  Concerns regarding medicines are outlined above.  Orders Placed This Encounter  Procedures  . Basic Metabolic Panel (BMET)  . Holter monitor - 48 hour  . ECHOCARDIOGRAM COMPLETE   Meds ordered this encounter  Medications  . potassium chloride SA (K-DUR,KLOR-CON) 20 MEQ tablet    Sig: Take 1 tablet (20 mEq total) by mouth daily.    Dispense:  90 tablet    Refill:  3  . hydrochlorothiazide (HYDRODIURIL) 25 MG tablet    Sig: Take 1 tablet (25 mg total) by mouth daily.    Dispense:  90 tablet    Refill:  3    Patient Instructions  Medication Instructions:  Your physician has recommended you make the following change in your medication:   START HCTZ (Hydrochlorothiazdie) 25 mg once daily START Kdur (Potassium chloride) 20 mEq once daily STOP Aspirin  If you need a refill on your cardiac medications before your next appointment, please call your pharmacy.    Lab work: Your physician recommends that you return for lab work in: 3 weeks for basic metabolic panel  If you have labs (blood work) drawn today and your tests are completely normal, you will receive your results only by: Marland Kitchen MyChart Message (if you have MyChart) OR . A paper copy in the mail If you have any lab test that is  abnormal or we need to change your treatment, we will call you to review the results.   Testing/Procedures: Your physician has recommended that you wear a holter monitor. Holter monitors are medical devices that record the heart's electrical activity. Doctors most often use these monitors to diagnose arrhythmias. Arrhythmias are problems with the speed or rhythm of the heartbeat. The monitor is a small, portable device. You can wear one while you do your normal daily activities. This is usually used to diagnose what is causing palpitations/syncope (passing out).  Your physician has requested that you have an echocardiogram. Echocardiography is a painless test that uses sound waves to create images of your heart. It provides your doctor with information about the size and shape of your heart and how well your heart's chambers and valves are working. This procedure takes approximately one hour. There are no restrictions for this procedure.    Follow-Up: At [facility], you and your health needs are our priority.  As part of our continuing mission to provide you with exceptional heart care, we have created designated Provider Care Teams.  These Care Teams include your primary Cardiologist (physician) and Advanced Practice Providers (APPs -  Physician Assistants and Nurse Practitioners) who all work together to provide you with the care you need, when you need it. You will need a follow up appointment in:  3 months.  Please call our office 2 months in advance to schedule this appointment.  You may see Mertie Moores, MD or one of the following Advanced Practice Providers on your designated Care Team: Richardson Dopp, PA-C [place], [place] . Daune Perch, NP  Signed, Mertie Moores, MD  03/13/2018 5:40 PM    [facility]

## 2018-03-13 NOTE — Patient Instructions (Addendum)
Medication Instructions:  Your physician has recommended you make the following change in your medication:   START HCTZ (Hydrochlorothiazdie) 25 mg once daily START Kdur (Potassium chloride) 20 mEq once daily STOP Aspirin  If you need a refill on your cardiac medications before your next appointment, please call your pharmacy.    Lab work: Your physician recommends that you return for lab work in: 3 weeks for basic metabolic panel  If you have labs (blood work) drawn today and your tests are completely normal, you will receive your results only by: Marland Kitchen MyChart Message (if you have MyChart) OR . A paper copy in the mail If you have any lab test that is abnormal or we need to change your treatment, we will call you to review the results.   Testing/Procedures: Your physician has recommended that you wear a holter monitor. Holter monitors are medical devices that record the heart's electrical activity. Doctors most often use these monitors to diagnose arrhythmias. Arrhythmias are problems with the speed or rhythm of the heartbeat. The monitor is a small, portable device. You can wear one while you do your normal daily activities. This is usually used to diagnose what is causing palpitations/syncope (passing out).  Your physician has requested that you have an echocardiogram. Echocardiography is a painless test that uses sound waves to create images of your heart. It provides your doctor with information about the size and shape of your heart and how well your heart's chambers and valves are working. This procedure takes approximately one hour. There are no restrictions for this procedure.    Follow-Up: At [facility], you and your health needs are our priority.  As part of our continuing mission to provide you with exceptional heart care, we have created designated Provider Care Teams.  These Care Teams include your primary Cardiologist (physician) and Advanced Practice Providers (APPs -   Physician Assistants and Nurse Practitioners) who all work together to provide you with the care you need, when you need it. You will need a follow up appointment in:  3 months.  Please call our office 2 months in advance to schedule this appointment.  You may see Kristeen Miss, MD or one of the following Advanced Practice Providers on your designated Care Team: Tereso Newcomer, PA-C [place], [place] . Berton Bon, NP

## 2018-03-21 ENCOUNTER — Other Ambulatory Visit: Payer: Self-pay | Admitting: Family Medicine

## 2018-03-22 NOTE — Telephone Encounter (Signed)
Patient called requesting atenolol (TENORMIN) 50 MG tablet [[id number]][[id number]]  Patient uses [facility] on [address]. Please follow up.

## 2018-03-22 NOTE — Telephone Encounter (Signed)
Requested medication (s) are due for refill today: Yes  Requested medication (s) are on the active medication list: Yes  Last refill:  Yes  Future visit scheduled: Yes  Notes to clinic:  05/31/16    Requested Prescriptions  Pending Prescriptions Disp Refills   atenolol (TENORMIN) 50 MG tablet [Pharmacy Med Name: ATENOLOL 50MG  TABLETS] 60 tablet 0    Sig: TAKE 1 TABLET BY MOUTH TWICE DAILY     There is no refill protocol information for this order

## 2018-03-27 ENCOUNTER — Other Ambulatory Visit (HOSPITAL_COMMUNITY): Payer: Medicare Other

## 2018-04-03 ENCOUNTER — Other Ambulatory Visit: Payer: Medicare Other

## 2018-04-06 ENCOUNTER — Ambulatory Visit: Payer: Medicare Other | Admitting: Family Medicine

## 2018-04-10 ENCOUNTER — Other Ambulatory Visit (HOSPITAL_COMMUNITY): Payer: Medicare Other

## 2018-04-13 ENCOUNTER — Encounter: Payer: Self-pay | Admitting: Cardiovascular Disease

## 2018-04-28 ENCOUNTER — Other Ambulatory Visit (HOSPITAL_COMMUNITY): Payer: Medicare Other

## 2018-05-07 ENCOUNTER — Encounter (HOSPITAL_COMMUNITY): Payer: Self-pay | Admitting: *Deleted

## 2018-05-07 ENCOUNTER — Ambulatory Visit (HOSPITAL_COMMUNITY)
Admission: EM | Admit: 2018-05-07 | Discharge: 2018-05-07 | Disposition: A | Discharge status: Home / Self Care | Payer: Medicare HMO | Attending: Physician Assistant | Admitting: Physician Assistant

## 2018-05-07 ENCOUNTER — Other Ambulatory Visit: Payer: Self-pay

## 2018-05-07 DIAGNOSIS — K0889 Other specified disorders of teeth and supporting structures: Secondary | ICD-10-CM | POA: Insufficient documentation

## 2018-05-07 HISTORY — DX: Cardiac murmur, unspecified: R01.1

## 2018-05-07 MED ORDER — AMOXICILLIN-POT CLAVULANATE 875-125 MG PO TABS: 1.0000 | ORAL_TABLET | Freq: Two times a day (BID) | ORAL | 0 refills | Status: DC

## 2018-05-07 MED ORDER — MELOXICAM 7.5 MG PO TABS: 7.5000 mg | ORAL_TABLET | Freq: Every day | ORAL | 0 refills | Status: DC

## 2018-05-07 MED ORDER — CLINDAMYCIN HCL 150 MG PO CAPS: 450.0000 mg | ORAL_CAPSULE | Freq: Three times a day (TID) | ORAL | 0 refills | Status: AC

## 2018-05-07 MED ORDER — CHLORHEXIDINE GLUCONATE 0.12 % MT SOLN: 15.0000 mL | Freq: Two times a day (BID) | OROMUCOSAL | 0 refills | Status: DC

## 2018-05-07 NOTE — Discharge Instructions (Signed)
Start Augmentin as directed for dental infection. Start Mobic. Do not take ibuprofen (motrin/advil)/ naproxen (aleve) while on mobic. Tylenol 1000mg  three times a day for additional pain relief. Peridex as directed. Follow up with dentist for further treatment and evaluation. If experiencing swelling of the throat, trouble breathing, trouble swallowing, leaning forward to breath, drooling, go to the emergency department for further evaluation.

## 2018-05-07 NOTE — ED Provider Notes (Signed)
[facility]    CSN: [id number] Arrival date & time: 05/07/18  1000     History   Chief Complaint Chief Complaint  Patient presents with  . Facial Pain    HPI Cheryl Castro is a 68 y.o. female.   68 year old female comes in for 2 week history of gradually worsening sinus pain/tooth pain. Has dental pain, but unsure where. States has throbbing pain to the sinuses. No obvious rhinorrhea, nasal congestion, cough. Denies fever, chills, night sweats. Patient has not followed up with a dentist, states "I'm not sure if it is my teeth". When asked about seeing her PCP, states "I was waiting to see my dentist". She has been taking ibuprofen without relief.      Past Medical History:  Diagnosis Date  . Anxiety   . GERD (gastroesophageal reflux disease)   . Glucose intolerance (impaired glucose tolerance)   . Heart murmur   . Hiatal hernia   . Hypertension   . Hypothyroidism   . Sleep apnea    no longer using CPAP    Patient Active Problem List   Diagnosis Date Noted  . 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 Numbness and tingling in left arm 02/17/2016  . Chest pain with low risk for cardiac etiology 02/05/2016  . Gastroesophageal reflux disease   . Pain in joint, ankle and foot 05/01/2015  . Healthcare maintenance 10/09/2014  . Palpitations 10/09/2014  . Bipolar II disorder (HCC) 01/30/2013  . Generalized anxiety disorder 01/02/2013  . Reflux gastritis 11/22/2011  . Hypothyroidism 04/10/2010  . PELVIC PAIN, CHRONIC 04/02/2010  . Obesity 02/25/2010  . Depression 12/30/2009  . Pruritus of skin 07/28/2009  . BACK PAIN, CHRONIC 07/28/2009  . Essential hypertension, benign 06/24/2009    Past Surgical History:  Procedure Laterality Date  . CARDIAC CATHETERIZATION N/A 02/27/2016   Procedure: Left Heart Cath and Coronary Angiography;  Surgeon: Corky CraftsJayadeep S Varanasi, MD;  [facility];  Service: Cardiovascular;   Laterality: N/A;  . CATARACT EXTRACTION Bilateral 10/10/2017    OB History   No obstetric history on file.      Home Medications    Prior to Admission medications   Medication Sig Start Date End Date Taking? Authorizing Provider  atenolol (TENORMIN) 50 MG tablet TAKE 1 TABLET BY MOUTH TWICE DAILY 03/22/18  Yes Bing NeighborsHarris, Kimberly S, FNP  busPIRone (BUSPAR) 10 MG tablet Take 10 mg by mouth 2 (two) times daily as needed (ANXIETY).   Yes [provider]  hydrochlorothiazide (HYDRODIURIL) 25 MG tablet Take 1 tablet (25 mg total) by mouth daily. 03/13/18 03/08/19 Yes Nahser, Deloris PingPhilip J, MD  hydrOXYzine (ATARAX/VISTARIL) 25 MG tablet Take 1 tablet (25 mg total) by mouth every 6 (six) hours. 02/28/18  Yes Wieters, Hallie C, PA-C  ibuprofen (ADVIL,MOTRIN) 800 MG tablet Take 1 tablet (800 mg total) by mouth every 8 (eight) hours as needed for moderate pain. 09/23/17  Yes Eustace MooreNelson, Yvonne Sue, MD  levothyroxine (SYNTHROID, LEVOTHROID) 200 MCG tablet Take 1 tablet (200 mcg total) by mouth daily before breakfast. 03/08/18  Yes Bing NeighborsHarris, Kimberly S, FNP  PARoxetine (PAXIL) 40 MG tablet Take 1 tablet (40 mg total) by mouth daily. 07/05/17  Yes Howard PouchFeng, Lauren, MD  aspirin-acetaminophen-caffeine (EXCEDRIN MIGRAINE) [id number] MG tablet Take 2 tablets by mouth 2 (two) times daily as needed for headache.    [provider]  chlorhexidine (PERIDEX) 0.12 % solution Use as directed 15 mLs in the mouth or throat  2 (two) times daily. 05/07/18   Cathie Hoops, Amy V, PA-C  clindamycin (CLEOCIN) 150 MG capsule Take 3 capsules (450 mg total) by mouth 3 (three) times daily for 7 days. 05/07/18 05/14/18  Belinda Fisher, PA-C  meloxicam (MOBIC) 7.5 MG tablet Take 1 tablet (7.5 mg total) by mouth daily. 05/07/18   Cathie Hoops, Amy V, PA-C  nitroGLYCERIN (NITROSTAT) 0.4 MG SL tablet Place 1 tablet (0.4 mg total) under the tongue every 5 (five) minutes as needed. 02/24/16   Tereso Newcomer T, PA-C  omeprazole (PRILOSEC) 40 MG capsule Take 1 capsule (40  mg total) by mouth daily. 09/23/17   Eustace Moore, MD    Family History Family History  Problem Relation Age of Onset  . Diabetes Other   . Hypertension Other   . CAD Other   . Heart attack Father        died of MI age 58  . Coronary artery disease Brother     Social History Social History   Tobacco Use  . Smoking status: Former Games developer  . Smokeless tobacco: Never Used  Substance Use Topics  . Alcohol use: No  . Drug use: No     Allergies   Patient has no known allergies.   Review of Systems Review of Systems  Reason unable to perform ROS: See HPI as above.     Physical Exam Triage Vital Signs ED Triage Vitals  Enc Vitals Group     BP 05/07/18 1011 (!) 188/88     Pulse Rate 05/07/18 1010 66     Resp 05/07/18 1010 (!) 22     Temp 05/07/18 1010 (!) 97.3 F (36.3 C)     Temp Source 05/07/18 1010 Oral     SpO2 05/07/18 1010 99 %     Weight --      Height --      Head Circumference --      Peak Flow --      Pain Score 05/07/18 1010 7     Pain Loc --      Pain Edu? --      Excl. in GC? --    No data found.  Updated Vital Signs BP (!) 188/88   Pulse 66   Temp (!) 97.3 F (36.3 C) (Oral)   Resp (!) 22   SpO2 99%   Physical Exam Constitutional:      General: She is not in acute distress.    Appearance: She is well-developed. She is not ill-appearing, toxic-appearing or diaphoretic.  HENT:     Head: Normocephalic and atraumatic.     Right Ear: Tympanic membrane, ear canal and external ear normal. Tympanic membrane is not erythematous or bulging.     Left Ear: Tympanic membrane, ear canal and external ear normal. Tympanic membrane is not erythematous or bulging.     Nose: Nose normal.     Mouth/Throat:     Mouth: Mucous membranes are moist.     Pharynx: Oropharynx is clear. Uvula midline. No uvula swelling.     Tonsils: No tonsillar exudate or tonsillar abscesses.     Comments: Patient with diffuse tenderness to light palpation of the face that  is distractible. She has tenderness to palpation of the right upper incisor with swelling around the gums. Floor of mouth soft to palpation. No facial swelling.  Eyes:     Conjunctiva/sclera: Conjunctivae normal.     Pupils: Pupils are equal, round, and reactive to light.  Neck:  Musculoskeletal: Normal range of motion and neck supple.  Cardiovascular:     Rate and Rhythm: Normal rate and regular rhythm.     Heart sounds: Normal heart sounds. No murmur. No friction rub. No gallop.   Pulmonary:     Effort: Pulmonary effort is normal.     Breath sounds: Normal breath sounds. No decreased breath sounds, wheezing, rhonchi or rales.  Lymphadenopathy:     Cervical: No cervical adenopathy.  Skin:    General: Skin is warm and dry.  Neurological:     Mental Status: She is alert and oriented to person, place, and time.  Psychiatric:        Behavior: Behavior normal.        Judgment: Judgment normal.      UC Treatments / Results  Labs (all labs ordered are listed, but only abnormal results are displayed) Labs Reviewed - No data to display  EKG None  Radiology No results found.  Procedures Procedures (including critical care time)  Medications Ordered in UC Medications - No data to display  Initial Impression / Assessment and Plan / UC Course  I have reviewed the triage vital signs and the nursing notes.  Pertinent labs & imaging results that were available during my care of the patient were reviewed by me and considered in my medical decision making (see chart for details).    Augmentin initially prescribed for possible dental infection. Patient informed she has been taking left over dosage of PCN on and off. Will switch to Clindamycin as directed. Other symptomatic treatment discussed. Return precautions given.  Final Clinical Impressions(s) / UC Diagnoses   Final diagnoses:  Pain, dental    ED Prescriptions    Medication Sig Dispense Auth. Provider    amoxicillin-clavulanate (AUGMENTIN) 875-125 MG tablet  (Status: Discontinued) Take 1 tablet by mouth every 12 (twelve) hours. 14 tablet Yu, Amy V, PA-C   meloxicam (MOBIC) 7.5 MG tablet Take 1 tablet (7.5 mg total) by mouth daily. 15 tablet Yu, Amy V, PA-C   chlorhexidine (PERIDEX) 0.12 % solution Use as directed 15 mLs in the mouth or throat 2 (two) times daily. 120 mL Yu, Amy V, PA-C   clindamycin (CLEOCIN) 150 MG capsule Take 3 capsules (450 mg total) by mouth 3 (three) times daily for 7 days. [address] capsule [place], [place] 05/07/18 1850

## 2018-05-07 NOTE — ED Triage Notes (Signed)
C/O pressure/throbbing in maxillary and frontal sinus area with upper palate pain x 2 wks with gradual worsening.

## 2018-05-10 ENCOUNTER — Telehealth: Payer: Self-pay

## 2018-05-10 NOTE — Telephone Encounter (Signed)
New message    Just an FYI. We have made several attempts to contact this patient including sending a letter to schedule or reschedule their echocardiogram. We will be removing the patient from the echo WQ.   Thank you 

## 2018-05-29 DIAGNOSIS — Z823 Family history of stroke: Secondary | ICD-10-CM | POA: Diagnosis not present

## 2018-05-29 DIAGNOSIS — E039 Hypothyroidism, unspecified: Secondary | ICD-10-CM | POA: Diagnosis not present

## 2018-05-29 DIAGNOSIS — Z6841 Body Mass Index (BMI) 40.0 and over, adult: Secondary | ICD-10-CM | POA: Diagnosis not present

## 2018-05-29 DIAGNOSIS — Z87891 Personal history of nicotine dependence: Secondary | ICD-10-CM | POA: Diagnosis not present

## 2018-05-29 DIAGNOSIS — R69 Illness, unspecified: Secondary | ICD-10-CM | POA: Diagnosis not present

## 2018-05-29 DIAGNOSIS — Z8249 Family history of ischemic heart disease and other diseases of the circulatory system: Secondary | ICD-10-CM | POA: Diagnosis not present

## 2018-05-29 DIAGNOSIS — I10 Essential (primary) hypertension: Secondary | ICD-10-CM | POA: Diagnosis not present

## 2018-07-20 ENCOUNTER — Telehealth: Payer: Self-pay | Admitting: Family Medicine

## 2018-07-20 MED ORDER — ATENOLOL 50 MG PO TABS: 50.0000 mg | ORAL_TABLET | Freq: Two times a day (BID) | ORAL | 2 refills | Status: DC

## 2018-07-20 NOTE — Telephone Encounter (Signed)
Caller Name: maleia lanie   Reason for Call: med refill atenolol (TENORMIN) 50 MG tablet [[id number]]    If this is a medication request: confirm pharmacy  [facility]    Verify call back number:  [phone]   Action taken by recipient of request:

## 2018-07-27 DIAGNOSIS — E038 Other specified hypothyroidism: Secondary | ICD-10-CM | POA: Diagnosis not present

## 2018-07-27 DIAGNOSIS — R011 Cardiac murmur, unspecified: Secondary | ICD-10-CM | POA: Diagnosis not present

## 2018-07-27 DIAGNOSIS — R7301 Impaired fasting glucose: Secondary | ICD-10-CM | POA: Diagnosis not present

## 2018-07-27 DIAGNOSIS — Z1331 Encounter for screening for depression: Secondary | ICD-10-CM | POA: Diagnosis not present

## 2018-07-27 DIAGNOSIS — H268 Other specified cataract: Secondary | ICD-10-CM | POA: Diagnosis not present

## 2018-07-27 DIAGNOSIS — I10 Essential (primary) hypertension: Secondary | ICD-10-CM | POA: Diagnosis not present

## 2018-07-27 DIAGNOSIS — R69 Illness, unspecified: Secondary | ICD-10-CM | POA: Diagnosis not present

## 2018-07-27 DIAGNOSIS — K219 Gastro-esophageal reflux disease without esophagitis: Secondary | ICD-10-CM | POA: Diagnosis not present

## 2018-07-27 DIAGNOSIS — M5489 Other dorsalgia: Secondary | ICD-10-CM | POA: Diagnosis not present

## 2018-08-02 ENCOUNTER — Other Ambulatory Visit: Payer: Self-pay | Admitting: Student in an Organized Health Care Education/Training Program

## 2018-09-13 ENCOUNTER — Other Ambulatory Visit: Payer: Self-pay | Admitting: Student in an Organized Health Care Education/Training Program

## 2018-10-13 ENCOUNTER — Telehealth: Payer: Self-pay | Admitting: Cardiovascular Disease

## 2018-10-13 DIAGNOSIS — R011 Cardiac murmur, unspecified: Secondary | ICD-10-CM

## 2018-10-13 NOTE — Telephone Encounter (Signed)
This was ordered in 03/2018. Will see if Dr. Acie Fredrickson still wants this done.

## 2018-10-13 NOTE — Telephone Encounter (Signed)
Follow up     For some reason this order is not showing up in our workqueue.  I think it has expired.  Please put in a new order for 48hr holter and it will be mailed to patient.   Thanks

## 2018-10-13 NOTE — Telephone Encounter (Signed)
I will send to [facility] to reschedule testing.

## 2018-10-13 NOTE — Telephone Encounter (Signed)
Call patient and see if she is still having palpitations. If so, please order the 48 hr monitor ( can be 3 days so she can use the patch monitor which is more convenient )

## 2018-10-13 NOTE — Telephone Encounter (Signed)
I have sent message to [facility] and Neil Crouch to reschedule testing.

## 2018-10-13 NOTE — Telephone Encounter (Signed)
New Message     Pt said she was suppose to come for a 48 hour holter monitor appt and Echo and was unable to come but would like to reschedule them    Please call

## 2018-10-13 NOTE — Telephone Encounter (Signed)
Left message for patient to call back  

## 2018-10-16 NOTE — Telephone Encounter (Signed)
Spoke with patient. She has not been having palpitations for at least the last 1-2 months.  Adv her to call back if they would start back up, so we could schedule a 3 day Zio patch for her.    New echo order placed and echo scheduled per ov note in Nov for murmur.    Covid screen done today but she is not scheduled for over 1 week.      COVID-19 Pre-Screening Questions:  . In the past 7 to 10 days have you had a cough,  shortness of breath, headache, congestion, fever (100 or greater) body aches, chills, sore throat, or sudden loss of taste or sense of smell?  NO . Have you been around anyone with known Covid 19.  NO . Have you been around anyone who is awaiting Covid 19 test results in the past 7 to 10 days?  NO . Have you been around anyone who has been exposed to Covid 19, or has mentioned symptoms of Covid 19 within the past 7 to 10 days?  NO  If you have any concerns/questions about symptoms patients report during screening (either on the phone or at [facility]). Contact the provider seeing the patient or DOD for further guidance.  If neither are available contact a member of the leadership team.

## 2018-10-19 DIAGNOSIS — R1011 Right upper quadrant pain: Secondary | ICD-10-CM | POA: Diagnosis not present

## 2018-10-19 DIAGNOSIS — I10 Essential (primary) hypertension: Secondary | ICD-10-CM | POA: Diagnosis not present

## 2018-10-19 DIAGNOSIS — K219 Gastro-esophageal reflux disease without esophagitis: Secondary | ICD-10-CM | POA: Diagnosis not present

## 2018-10-20 ENCOUNTER — Other Ambulatory Visit (HOSPITAL_COMMUNITY): Payer: Medicare HMO

## 2018-10-23 ENCOUNTER — Telehealth (HOSPITAL_COMMUNITY): Payer: Self-pay

## 2018-10-23 NOTE — Telephone Encounter (Signed)

## 2018-10-24 ENCOUNTER — Other Ambulatory Visit (HOSPITAL_COMMUNITY): Payer: Medicare HMO

## 2018-10-30 ENCOUNTER — Encounter (HOSPITAL_COMMUNITY): Payer: Self-pay | Admitting: Internal Medicine

## 2018-11-13 ENCOUNTER — Other Ambulatory Visit: Payer: Self-pay | Admitting: Student in an Organized Health Care Education/Training Program

## 2018-11-14 ENCOUNTER — Telehealth: Payer: Self-pay

## 2018-11-14 NOTE — Telephone Encounter (Signed)
Called patient to do their pre-visit COVID screening.  Call went to voicemail. Unable to do prescreening.  

## 2018-11-15 ENCOUNTER — Ambulatory Visit: Payer: Medicare HMO | Admitting: Family Medicine

## 2018-11-22 ENCOUNTER — Telehealth: Payer: Self-pay

## 2018-11-22 NOTE — Telephone Encounter (Signed)
Called patient to do their pre-visit COVID screening.  Have you been tested for COVID or are you currently waiting for COVID test results? no  Have you recently traveled internationally([place], [place], [place], [place], [place]) or within the [place] to a hotspot area([place], [place], [place], [place], [place])? no  Are you currently experiencing any of the following: fever, cough, SHOB, fatigue, body aches, loss of smell, rash, diarrhea, vomiting, severe headaches, weakness, sore throat? no  Have you been in contact with anyone who has recently travelled? no  Have you been in contact with anyone who is experiencing any of the above symptoms or been diagnosed with COVID  or works in or has recently visited a SNF? no  Asked patient to come in fasting for repeat labs.

## 2018-11-23 ENCOUNTER — Ambulatory Visit (INDEPENDENT_AMBULATORY_CARE_PROVIDER_SITE_OTHER): Payer: Medicare HMO | Admitting: Family Medicine

## 2018-11-23 ENCOUNTER — Encounter: Payer: Self-pay | Admitting: Family Medicine

## 2018-11-23 ENCOUNTER — Other Ambulatory Visit: Payer: Self-pay

## 2018-11-23 VITALS — BP 170/104 | HR 63 | Temp 98.6°F | Resp 17 | Ht 64.0 in | Wt 255.0 lb

## 2018-11-23 DIAGNOSIS — R1011 Right upper quadrant pain: Secondary | ICD-10-CM | POA: Diagnosis not present

## 2018-11-23 DIAGNOSIS — Z6841 Body Mass Index (BMI) 40.0 and over, adult: Secondary | ICD-10-CM | POA: Diagnosis not present

## 2018-11-23 DIAGNOSIS — R739 Hyperglycemia, unspecified: Secondary | ICD-10-CM | POA: Diagnosis not present

## 2018-11-23 DIAGNOSIS — R252 Cramp and spasm: Secondary | ICD-10-CM

## 2018-11-23 DIAGNOSIS — I10 Essential (primary) hypertension: Secondary | ICD-10-CM

## 2018-11-23 DIAGNOSIS — R69 Illness, unspecified: Secondary | ICD-10-CM | POA: Diagnosis not present

## 2018-11-23 DIAGNOSIS — E039 Hypothyroidism, unspecified: Secondary | ICD-10-CM | POA: Diagnosis not present

## 2018-11-23 DIAGNOSIS — F411 Generalized anxiety disorder: Secondary | ICD-10-CM

## 2018-11-23 LAB — POCT URINALYSIS DIP (CLINITEK)
Bilirubin, UA: NEGATIVE
Glucose, UA: NEGATIVE mg/dL
Ketones, POC UA: NEGATIVE mg/dL
Leukocytes, UA: NEGATIVE
Nitrite, UA: NEGATIVE
POC PROTEIN,UA: 30 — AB
Spec Grav, UA: >=1.03 — AB (ref 1.010–1.025)
Urobilinogen, UA: 0.2 E.U./dL
pH, UA: 5.5 (ref 5.0–8.0)

## 2018-11-23 MED ORDER — HYDROXYZINE HCL 25 MG PO TABS: 25.0000 mg | ORAL_TABLET | Freq: Three times a day (TID) | ORAL | 6 refills | Status: AC | PRN

## 2018-11-23 MED ORDER — ATENOLOL 50 MG PO TABS: 50.0000 mg | ORAL_TABLET | Freq: Two times a day (BID) | ORAL | 6 refills | Status: DC

## 2018-11-23 MED ORDER — HYDROCHLOROTHIAZIDE 25 MG PO TABS: 25.0000 mg | ORAL_TABLET | Freq: Every day | ORAL | 6 refills | Status: DC

## 2018-11-23 MED ORDER — TIZANIDINE HCL 4 MG PO TABS: 4.0000 mg | ORAL_TABLET | Freq: Three times a day (TID) | ORAL | 1 refills | Status: DC | PRN

## 2018-11-23 MED ORDER — PAROXETINE HCL 40 MG PO TABS: 40.0000 mg | ORAL_TABLET | Freq: Every day | ORAL | 6 refills | Status: AC

## 2018-11-23 NOTE — Patient Instructions (Signed)
° °Calorie Counting for Weight Loss °Calories are units of energy. Your body needs a certain amount of calories from food to keep you going throughout the day. When you eat more calories than your body needs, your body stores the extra calories as fat. When you eat fewer calories than your body needs, your body burns fat to get the energy it needs. °Calorie counting means keeping track of how many calories you eat and drink each day. Calorie counting can be helpful if you need to lose weight. If you make sure to eat fewer calories than your body needs, you should lose weight. Ask your health care provider what a healthy weight is for you. °For calorie counting to work, you will need to eat the right number of calories in a day in order to lose a healthy amount of weight per week. A dietitian can help you determine how many calories you need in a day and will give you suggestions on how to reach your calorie goal. °· A healthy amount of weight to lose per week is usually 1-2 lb (0.5-0.9 kg). This usually means that your daily calorie intake should be reduced by 500-750 calories. °· Eating 1,200 - 1,500 calories per day can help most women lose weight. °· Eating 1,500 - 1,800 calories per day can help most men lose weight. °What is my plan? °My goal is to have __________ calories per day. °If I have this many calories per day, I should lose around __________ pounds per week. °What do I need to know about calorie counting? °In order to meet your daily calorie goal, you will need to: °· Find out how many calories are in each food you would like to eat. Try to do this before you eat. °· Decide how much of the food you plan to eat. °· Write down what you ate and how many calories it had. Doing this is called keeping a food log. °To successfully lose weight, it is important to balance calorie counting with a healthy lifestyle that includes regular activity. Aim for 150 minutes of moderate exercise (such as walking) or 75  minutes of vigorous exercise (such as running) each week. °Where do I find calorie information? ° °The number of calories in a food can be found on a Nutrition Facts label. If a food does not have a Nutrition Facts label, try to look up the calories online or ask your dietitian for help. °Remember that calories are listed per serving. If you choose to have more than one serving of a food, you will have to multiply the calories per serving by the amount of servings you plan to eat. For example, the label on a package of bread might say that a serving size is 1 slice and that there are 90 calories in a serving. If you eat 1 slice, you will have eaten 90 calories. If you eat 2 slices, you will have eaten 180 calories. °How do I keep a food log? °Immediately after each meal, record the following information in your food log: °· What you ate. Don't forget to include toppings, sauces, and other extras on the food. °· How much you ate. This can be measured in cups, ounces, or number of items. °· How many calories each food and drink had. °· The total number of calories in the meal. °Keep your food log near you, such as in a small notebook in your pocket, or use a mobile app or website. Some programs will   calculate calories for you and show you how many calories you have left for the day to meet your goal. °What are some calorie counting tips? ° °· Use your calories on foods and drinks that will fill you up and not leave you hungry: °? Some examples of foods that fill you up are nuts and nut butters, vegetables, lean proteins, and high-fiber foods like whole grains. High-fiber foods are foods with more than 5 g fiber per serving. °? Drinks such as sodas, specialty coffee drinks, alcohol, and juices have a lot of calories, yet do not fill you up. °· Eat nutritious foods and avoid empty calories. Empty calories are calories you get from foods or beverages that do not have many vitamins or protein, such as candy, sweets, and  soda. It is better to have a nutritious high-calorie food (such as an avocado) than a food with few nutrients (such as a bag of chips). °· Know how many calories are in the foods you eat most often. This will help you calculate calorie counts faster. °· Pay attention to calories in drinks. Low-calorie drinks include water and unsweetened drinks. °· Pay attention to nutrition labels for "low fat" or "fat free" foods. These foods sometimes have the same amount of calories or more calories than the full fat versions. They also often have added sugar, starch, or salt, to make up for flavor that was removed with the fat. °· Find a way of tracking calories that works for you. Get creative. Try different apps or programs if writing down calories does not work for you. °What are some portion control tips? °· Know how many calories are in a serving. This will help you know how many servings of a certain food you can have. °· Use a measuring cup to measure serving sizes. You could also try weighing out portions on a kitchen scale. With time, you will be able to estimate serving sizes for some foods. °· Take some time to put servings of different foods on your favorite plates, bowls, and cups so you know what a serving looks like. °· Try not to eat straight from a bag or box. Doing this can lead to overeating. Put the amount you would like to eat in a cup or on a plate to make sure you are eating the right portion. °· Use smaller plates, glasses, and bowls to prevent overeating. °· Try not to multitask (for example, watch TV or use your computer) while eating. If it is time to eat, sit down at a table and enjoy your food. This will help you to know when you are full. It will also help you to be aware of what you are eating and how much you are eating. °What are tips for following this plan? °Reading food labels °· Check the calorie count compared to the serving size. The serving size may be smaller than what you are used to  eating. °· Check the source of the calories. Make sure the food you are eating is high in vitamins and protein and low in saturated and trans fats. °Shopping °· Read nutrition labels while you shop. This will help you make healthy decisions before you decide to purchase your food. °· Make a grocery list and stick to it. °Cooking °· Try to cook your favorite foods in a healthier way. For example, try baking instead of frying. °· Use low-fat dairy products. °Meal planning °· Use more fruits and vegetables. Half of your plate should be   fruits and vegetables. °· Include lean proteins like poultry and fish. °How do I count calories when eating out? °· Ask for smaller portion sizes. °· Consider sharing an entree and sides instead of getting your own entree. °· If you get your own entree, eat only half. Ask for a box at the beginning of your meal and put the rest of your entree in it so you are not tempted to eat it. °· If calories are listed on the menu, choose the lower calorie options. °· Choose dishes that include vegetables, fruits, whole grains, low-fat dairy products, and lean protein. °· Choose items that are boiled, broiled, grilled, or steamed. Stay away from items that are buttered, battered, fried, or served with cream sauce. Items labeled "crispy" are usually fried, unless stated otherwise. °· Choose water, low-fat milk, unsweetened iced tea, or other drinks without added sugar. If you want an alcoholic beverage, choose a lower calorie option such as a glass of wine or light beer. °· Ask for dressings, sauces, and syrups on the side. These are usually high in calories, so you should limit the amount you eat. °· If you want a salad, choose a garden salad and ask for grilled meats. Avoid extra toppings like bacon, cheese, or fried items. Ask for the dressing on the side, or ask for olive oil and vinegar or lemon to use as dressing. °· Estimate how many servings of a food you are given. For example, a serving of  cooked rice is ½ cup or about the size of half a baseball. Knowing serving sizes will help you be aware of how much food you are eating at restaurants. The list below tells you how big or small some common portion sizes are based on everyday objects: °? 1 oz--4 stacked dice. °? 3 oz--1 deck of cards. °? 1 tsp--1 die. °? 1 Tbsp--½ a ping-pong ball. °? 2 Tbsp--1 ping-pong ball. °? ½ cup--½ baseball. °? 1 cup--1 baseball. °Summary °· Calorie counting means keeping track of how many calories you eat and drink each day. If you eat fewer calories than your body needs, you should lose weight. °· A healthy amount of weight to lose per week is usually 1-2 lb (0.5-0.9 kg). This usually means reducing your daily calorie intake by 500-750 calories. °· The number of calories in a food can be found on a Nutrition Facts label. If a food does not have a Nutrition Facts label, try to look up the calories online or ask your dietitian for help. °· Use your calories on foods and drinks that will fill you up, and not on foods and drinks that will leave you hungry. °· Use smaller plates, glasses, and bowls to prevent overeating. °This information is not intended to replace advice given to you by your health care provider. Make sure you discuss any questions you have with your health care provider. °Document Released: 04/19/2005 Document Revised: 01/06/2018 Document Reviewed: 03/19/2016 °Elsevier Patient Education © 2020 [employer] ° °

## 2018-11-23 NOTE — Progress Notes (Signed)
Subjective:  Patient ID: Cheryl Castro, female    DOB: 03-22-1951  Age: 68 y.o. MRN: [id number]  CC: [place] Pain and Hypertension   HPI Cheryl Castro is a 68 year old female with a history of CAD, hypertension, hypothyroidism, morbid obesity who presents today for follow-up visit. Her last visit to the clinic was in 03/2018.  Today she is concerned about right-sided abdominal pain which sometimes radiates to her back and has been intermittent and rated as a 6/10.  She denies dysuria, hematuria, urinary frequency.  She also denies nausea, vomiting, diarrhea and is unsure if she is constipated. She works at the senior center at night and has noticed these episodes occurring at work.  It usually feels like a catch in the right side of her abdomen and she has to sit still until it resolves. She also noticed leg cramps which have been intermittent occurring at night for the last 1 week when she has to get up and walk around for relief. She is also concerned about hyperpigmented rash on her neck and left underarm and is unsure of its duration.  Denies pruritus or increase in size of rash.  Her blood pressure is elevated and she is yet to take antihypertensive as she is fasting in anticipation of labs.  She endorses compliance with levothyroxine and her anxiety medications but had to discontinue BuSpar as she did not like how it made her feel.  Wonders if she can receive a prescription for an appetite suppressant; she has not been compliant with exercise and does not pay any particular attention to her meals. Last visit with her cardiologist, Dr Acie Fredrickson was in 03/2018 and she denies chest pains, palpitations or pedal edema.  Past Medical History:  Diagnosis Date  . Anxiety   . GERD (gastroesophageal reflux disease)   . Glucose intolerance (impaired glucose tolerance)   . Heart murmur   . Hiatal hernia   . Hypertension   . Hypothyroidism   . Sleep apnea    no longer using CPAP     Past Surgical History:  Procedure Laterality Date  . CARDIAC CATHETERIZATION N/A 02/27/2016   Procedure: Left Heart Cath and Coronary Angiography;  Surgeon: Jettie Booze, MD;  Location: [facility];  Service: Cardiovascular;  Laterality: N/A;  . CATARACT EXTRACTION Bilateral 10/10/2017    Family History  Problem Relation Age of Onset  . Diabetes Other   . Hypertension Other   . CAD Other   . Heart attack Father        died of MI age 33  . Coronary artery disease Brother     No Known Allergies  Outpatient Medications Prior to Visit  Medication Sig Dispense Refill  . levothyroxine (SYNTHROID) 25 MCG tablet TAKE 1 TABLET EVERY DAY IN ADDITION TO 200 MCG ON EMPTY STOMACH SEPARATE FROM ANY CALCIUM OR MAGNESIUM BY AT LEAST 30 MINUTES    . levothyroxine (SYNTHROID, LEVOTHROID) 200 MCG tablet Take 1 tablet (200 mcg total) by mouth daily before breakfast. 90 tablet 1  . nitroGLYCERIN (NITROSTAT) 0.4 MG SL tablet Place 1 tablet (0.4 mg total) under the tongue every 5 (five) minutes as needed. 25 tablet 3  . omeprazole (PRILOSEC) 40 MG capsule Take 1 capsule (40 mg total) by mouth daily. 30 capsule 1  . atenolol (TENORMIN) 50 MG tablet Take 1 tablet (50 mg total) by mouth 2 (two) times daily. 60 tablet 2  . hydrochlorothiazide (HYDRODIURIL) 25 MG tablet Take 1 tablet (25 mg  total) by mouth daily. 90 tablet 3  . hydrOXYzine (ATARAX/VISTARIL) 25 MG tablet Take 1 tablet (25 mg total) by mouth every 6 (six) hours. 30 tablet 0  . PARoxetine (PAXIL) 40 MG tablet TAKE 1 TABLET BY MOUTH EVERY DAY 15 tablet 0  . busPIRone (BUSPAR) 10 MG tablet Take 10 mg by mouth 2 (two) times daily as needed (ANXIETY).     No facility-administered medications prior to visit.      ROS Review of Systems  Constitutional: Negative for activity change, appetite change and fatigue.  HENT: Negative for congestion, sinus pressure and sore throat.   Eyes: Negative for visual disturbance.  Respiratory:  Negative for cough, chest tightness, shortness of breath and wheezing.   Cardiovascular: Negative for chest pain and palpitations.  Gastrointestinal: Positive for abdominal pain. Negative for abdominal distention and constipation.  Endocrine: Negative for polydipsia.  Genitourinary: Negative for dysuria and frequency.  Musculoskeletal:       See HPI  Skin: Positive for rash.  Neurological: Negative for tremors, light-headedness and numbness.  Hematological: Does not bruise/bleed easily.  Psychiatric/Behavioral: Negative for agitation and behavioral problems.    Objective:  BP (!) 170/104   Pulse 63   Temp 98.6 F (37 C) (Temporal)   Resp 17   Ht _0  (1.626 m)   Wt 255 lb (115.7 kg)   SpO2 96%   BMI 43.77 kg/m   BP/Weight 11/23/2018 05/07/2018 16/38/4665  Systolic BP 993 570 177  Diastolic BP 939 88 80  Wt. (Lbs) 255 - 253.6  BMI 43.77 - 43.53      Physical Exam Constitutional:      Appearance: She is well-developed.  Cardiovascular:     Rate and Rhythm: Normal rate.     Heart sounds: Murmur (2/6 Systolic murmur) present.  Pulmonary:     Effort: Pulmonary effort is normal.     Breath sounds: Normal breath sounds. No wheezing or rales.  Chest:     Chest wall: No tenderness.  Abdominal:     General: Bowel sounds are normal. There is no distension.     Palpations: Abdomen is soft. There is no mass.     Tenderness: There is no abdominal tenderness.  Musculoskeletal: Normal range of motion.  Skin:    Comments: Hyperpigmented rash in left under arm and right side of neck.  Neurological:     Mental Status: She is alert and oriented to person, place, and time.     CMP Latest Ref Rng & Units 02/28/2018 12/22/2016 02/24/2016  Glucose 70 - 99 mg/dL 97 101(H) 80  BUN 8 - 23 mg/dL _1 Creatinine 0.44 - 1.00 mg/dL 0.90 0.67 0.74  Sodium 135 - 145 mmol/L 134(L) 140 137  Potassium 3.5 - 5.1 mmol/L 4.4 4.4 4.2  Chloride 98 - 111 mmol/L 97(L) 106 100  CO2 22 - 32  mmol/L - 27 28  Calcium 8.9 - 10.3 mg/dL - 9.4 9.4  Total Protein 6.5 - 8.1 g/dL - 7.4 -  Total Bilirubin 0.3 - 1.2 mg/dL - 0.3 -  Alkaline Phos 38 - 126 U/L - 87 -  AST 15 - 41 U/L - 23 -  ALT 14 - 54 U/L - 19 -    Lipid Panel     Component Value Date/Time   CHOL 162 02/05/2016 0743   TRIG 92 02/05/2016 0743   HDL 39 (L) 02/05/2016 0743   CHOLHDL 4.2 02/05/2016 0743   VLDL 18 02/05/2016 0743  LDLCALC 105 (H) 02/05/2016 0743   LDLDIRECT 113 (H) 11/22/2011 1703    CBC    Component Value Date/Time   WBC 7.1 03/07/2018 1653   WBC 5.4 12/22/2016 1409   RBC 5.09 03/07/2018 1653   RBC 5.22 (H) 12/22/2016 1409   HGB 14.9 03/07/2018 1653   HCT 44.4 03/07/2018 1653   PLT 335 03/07/2018 1653   MCV 87 03/07/2018 1653   MCH 29.3 03/07/2018 1653   MCH 28.2 12/22/2016 1409   MCHC 33.6 03/07/2018 1653   MCHC 33.0 12/22/2016 1409   RDW 12.9 03/07/2018 1653   LYMPHSABS 1.8 03/07/2018 1653   MONOABS 427 02/24/2016 1644   EOSABS 0.1 03/07/2018 1653   BASOSABS 0.1 03/07/2018 1653    Lab Results  Component Value Date   HGBA1C 5.7 (H) 03/07/2018    Assessment & Plan:   1. Abdominal pain, right upper quadrant Urinalysis negative for UTI Advised to use OTC Gas-X as her symptoms could be due to excessive gas versus constipation Also to use MiraLAX  2. Hypothyroidism, unspecified type We will send of thyroid panel and adjust levothyroxine dose accordingly - T4, free - TSH  3. Generalized anxiety disorder She discontinued BuSpar due to intolerance Continue Paxil and hydroxyzine - hydrOXYzine (ATARAX/VISTARIL) 25 MG tablet; Take 1 tablet (25 mg total) by mouth every 8 (eight) hours as needed.  Dispense: 60 tablet; Refill: 6 - PARoxetine (PAXIL) 40 MG tablet; Take 1 tablet (40 mg total) by mouth daily.  Dispense: 30 tablet; Refill: 6  4. Essential hypertension, benign Uncontrolled due to not taking antihypertensive today Reported blood pressures at home have been  controlled Counseled on blood pressure goal of less than 130/80, low-sodium, DASH diet, medication compliance, 150 minutes of moderate intensity exercise per week. Discussed medication compliance, adverse effects. - hydrochlorothiazide (HYDRODIURIL) 25 MG tablet; Take 1 tablet (25 mg total) by mouth daily.  Dispense: 90 tablet; Refill: 6 - CMP14+EGFR - Lipid panel - Hemoglobin A1c - atenolol (TENORMIN) 50 MG tablet; Take 1 tablet (50 mg total) by mouth 2 (two) times daily.  Dispense: 60 tablet; Refill: 6  5. hx of prediabetes We will send of A1c  6. Morbid obesity with BMI of 40.0-44.9, adult ([place]) She is interested in an appetite suppressant If A1c returns in the prediabetic range and will commence metformin which will also help with weight loss; meanwhile advised to use OTC Orlistat. Counseled on reducing portion sizes, increasing physical activity, avoiding late meals  7. Leg cramps Placed on tizanidine.  Rash is not pruritic and is not increasing in size,no suspicious features.  Advised to monitor and if progressively increasing in size I will refer to dermatology.  Meds ordered this encounter  Medications  . hydrochlorothiazide (HYDRODIURIL) 25 MG tablet    Sig: Take 1 tablet (25 mg total) by mouth daily.    Dispense:  90 tablet    Refill:  6  . hydrOXYzine (ATARAX/VISTARIL) 25 MG tablet    Sig: Take 1 tablet (25 mg total) by mouth every 8 (eight) hours as needed.    Dispense:  60 tablet    Refill:  6  . PARoxetine (PAXIL) 40 MG tablet    Sig: Take 1 tablet (40 mg total) by mouth daily.    Dispense:  30 tablet    Refill:  6  . atenolol (TENORMIN) 50 MG tablet    Sig: Take 1 tablet (50 mg total) by mouth 2 (two) times daily.    Dispense:  60 tablet  Refill:  6    Follow-up: Return in about 3 months (around 02/23/2019) for Chronic medical conditions.       Charlott Rakes, MD, FAAFP. [facility] and [place], [place]    11/23/2018, 11:15 AM

## 2018-11-23 NOTE — Progress Notes (Signed)
R side pain off & on x 1 month. Denies urinary symptoms(frequency, pain, urgency).

## 2018-11-24 ENCOUNTER — Other Ambulatory Visit: Payer: Self-pay | Admitting: Family Medicine

## 2018-11-24 LAB — CMP14+EGFR
ALT: 38 IU/L — ABNORMAL HIGH (ref 0–32)
AST: 35 IU/L (ref 0–40)
Albumin/Globulin Ratio: 1.6 (ref 1.2–2.2)
Albumin: 4.8 g/dL (ref 3.8–4.8)
Alkaline Phosphatase: 81 IU/L (ref 39–117)
BUN/Creatinine Ratio: 11 — ABNORMAL LOW (ref 12–28)
BUN: 10 mg/dL (ref 8–27)
Bilirubin Total: 0.3 mg/dL (ref 0.0–1.2)
CO2: 25 mmol/L (ref 20–29)
Calcium: 9.7 mg/dL (ref 8.7–10.3)
Chloride: 96 mmol/L (ref 96–106)
Creatinine, Ser: 0.92 mg/dL (ref 0.57–1.00)
GFR calc Af Amer: 75 mL/min/{1.73_m2} (ref >59)
GFR calc non Af Amer: 65 mL/min/{1.73_m2} (ref >59)
Globulin, Total: 3 g/dL (ref 1.5–4.5)
Glucose: 97 mg/dL (ref 65–99)
Potassium: 4.4 mmol/L (ref 3.5–5.2)
Sodium: 138 mmol/L (ref 134–144)
Total Protein: 7.8 g/dL (ref 6.0–8.5)

## 2018-11-24 LAB — LIPID PANEL
Chol/HDL Ratio: 3.8 ratio (ref 0.0–4.4)
Cholesterol, Total: 229 mg/dL — ABNORMAL HIGH (ref 100–199)
HDL: 61 mg/dL (ref >39)
LDL Calculated: 128 mg/dL — ABNORMAL HIGH (ref 0–99)
Triglycerides: 201 mg/dL — ABNORMAL HIGH (ref 0–149)
VLDL Cholesterol Cal: 40 mg/dL (ref 5–40)

## 2018-11-24 LAB — HEMOGLOBIN A1C
Est. average glucose Bld gHb Est-mCnc: 123 mg/dL
Hgb A1c MFr Bld: 5.9 % — ABNORMAL HIGH (ref 4.8–5.6)

## 2018-11-24 LAB — T4, FREE: Free T4: 0.43 ng/dL — ABNORMAL LOW (ref 0.82–1.77)

## 2018-11-24 LAB — TSH: TSH: 30.18 u[IU]/mL — ABNORMAL HIGH (ref 0.450–4.500)

## 2018-11-24 MED ORDER — ATORVASTATIN CALCIUM 20 MG PO TABS: 20.0000 mg | ORAL_TABLET | Freq: Every day | ORAL | 1 refills | Status: DC

## 2018-11-24 MED ORDER — LEVOTHYROXINE SODIUM 125 MCG PO TABS: 250.0000 ug | ORAL_TABLET | Freq: Every day | ORAL | 3 refills | Status: AC

## 2018-11-28 NOTE — Progress Notes (Signed)
Patient notified of results & recommendations. Expressed understanding. Is agreeable to medication changes. States that she will call back to schedule her 3 month follow up appointment.

## 2018-12-08 ENCOUNTER — Telehealth (HOSPITAL_COMMUNITY): Payer: Self-pay

## 2018-12-08 NOTE — Telephone Encounter (Signed)
Noted, thank you

## 2018-12-08 NOTE — Telephone Encounter (Signed)
New message  Just an FYI. We have made several attempts to contact this patient including sending a letter to schedule or reschedule their echocardiogram. We will be removing the patient from the echo WQ.   8.7.20 @ 9:18am lm on home vm - Ramiz Turpin  6.29.20 @ 11:00am lm on home vm - Jenisha Faison   6.29.20 mail reminder letter Jari Sportsman 6.19.20 patient cancel 6.23.20 no show

## 2018-12-14 DIAGNOSIS — K219 Gastro-esophageal reflux disease without esophagitis: Secondary | ICD-10-CM | POA: Diagnosis not present

## 2018-12-14 DIAGNOSIS — E039 Hypothyroidism, unspecified: Secondary | ICD-10-CM | POA: Diagnosis not present

## 2018-12-14 DIAGNOSIS — I10 Essential (primary) hypertension: Secondary | ICD-10-CM | POA: Diagnosis not present

## 2018-12-28 ENCOUNTER — Other Ambulatory Visit: Payer: Self-pay

## 2018-12-28 DIAGNOSIS — I10 Essential (primary) hypertension: Secondary | ICD-10-CM

## 2018-12-28 MED ORDER — ATENOLOL 50 MG PO TABS: 50.0000 mg | ORAL_TABLET | Freq: Two times a day (BID) | ORAL | 0 refills | Status: AC

## 2018-12-29 DIAGNOSIS — E039 Hypothyroidism, unspecified: Secondary | ICD-10-CM | POA: Diagnosis not present

## 2018-12-29 DIAGNOSIS — Z13220 Encounter for screening for lipoid disorders: Secondary | ICD-10-CM | POA: Diagnosis not present

## 2018-12-29 DIAGNOSIS — L305 Pityriasis alba: Secondary | ICD-10-CM | POA: Diagnosis not present

## 2018-12-29 DIAGNOSIS — Z6841 Body Mass Index (BMI) 40.0 and over, adult: Secondary | ICD-10-CM | POA: Diagnosis not present

## 2018-12-29 DIAGNOSIS — Z0000 Encounter for general adult medical examination without abnormal findings: Secondary | ICD-10-CM | POA: Diagnosis not present

## 2018-12-29 DIAGNOSIS — Z131 Encounter for screening for diabetes mellitus: Secondary | ICD-10-CM | POA: Diagnosis not present

## 2018-12-29 DIAGNOSIS — E559 Vitamin D deficiency, unspecified: Secondary | ICD-10-CM | POA: Diagnosis not present

## 2018-12-29 DIAGNOSIS — I10 Essential (primary) hypertension: Secondary | ICD-10-CM | POA: Diagnosis not present

## 2019-02-04 ENCOUNTER — Encounter: Payer: Self-pay | Admitting: Cardiovascular Disease

## 2019-02-04 NOTE — Progress Notes (Signed)
Error. No show.

## 2019-02-05 ENCOUNTER — Ambulatory Visit (INDEPENDENT_AMBULATORY_CARE_PROVIDER_SITE_OTHER): Payer: Medicare HMO | Admitting: Cardiovascular Disease

## 2019-02-05 DIAGNOSIS — R002 Palpitations: Secondary | ICD-10-CM

## 2019-02-17 ENCOUNTER — Other Ambulatory Visit: Payer: Self-pay | Admitting: Student in an Organized Health Care Education/Training Program

## 2019-02-17 DIAGNOSIS — F411 Generalized anxiety disorder: Secondary | ICD-10-CM

## 2019-02-22 ENCOUNTER — Ambulatory Visit (HOSPITAL_COMMUNITY)
Admission: EM | Admit: 2019-02-22 | Discharge: 2019-02-22 | Disposition: A | Discharge status: Home / Self Care | Payer: Medicare HMO | Attending: Family Medicine | Admitting: Family Medicine

## 2019-02-22 ENCOUNTER — Other Ambulatory Visit: Payer: Self-pay

## 2019-02-22 ENCOUNTER — Encounter (HOSPITAL_COMMUNITY): Payer: Self-pay

## 2019-02-22 DIAGNOSIS — Z20822 Contact with and (suspected) exposure to covid-19: Secondary | ICD-10-CM

## 2019-02-22 DIAGNOSIS — M109 Gout, unspecified: Secondary | ICD-10-CM | POA: Diagnosis not present

## 2019-02-22 MED ORDER — PREDNISONE 10 MG (21) PO TBPK: ORAL_TABLET | ORAL | 0 refills | Status: DC

## 2019-02-22 MED ORDER — TRAMADOL HCL 50 MG PO TABS: 50.0000 mg | ORAL_TABLET | Freq: Two times a day (BID) | ORAL | 0 refills | Status: AC | PRN

## 2019-02-22 NOTE — Discharge Instructions (Signed)
This is a gout flareup. I am giving you some information about gout and the low purine eating plan Prednisone taper over the next 6 days.  Make sure you eat that you take this with food. I will give you a few stronger pain pills until the prednisone kicks in Follow-up with your doctor as needed.

## 2019-02-22 NOTE — ED Provider Notes (Signed)
[facility]    CSN: [id number] Arrival date & time: 02/22/19  0846      History   Chief Complaint Chief Complaint  Patient presents with   Gout    HPI Cheryl Castro is a 68 y.o. female.   Patient is a 68 year old female with past medical history of anxiety, GERD, hernia, hypertension, hypothyroidism, sleep apnea.  She presents today right great toe joint pain, redness, swelling.  This started yesterday.  Describes the pain is severe even with light touch.  Symptoms have been constant and worsening.  She has been taking Tylenol and ibuprofen without any relief.  Never officially been diagnosed with gout.  Was told had elevated uric acid levels before.  Denies any injury to the foot.  Denies any fever, chills.  Unable to ambulate due to the pain.  ROS per HPI      Past Medical History:  Diagnosis Date   Anxiety    GERD (gastroesophageal reflux disease)    Glucose intolerance (impaired glucose tolerance)    Heart murmur    Hiatal hernia    Hypertension    Hypothyroidism    Sleep apnea    no longer using CPAP    Patient Active Problem List   Diagnosis Date Noted   Minimal CAD on Cardiac Catheterization 03/2016 03/15/2016   Numbness and tingling in left arm 02/17/2016   Chest pain with low risk for cardiac etiology 02/05/2016   Gastroesophageal reflux disease    Pain in joint, ankle and foot 05/01/2015   Healthcare maintenance 10/09/2014   Palpitations 10/09/2014   Bipolar II disorder ([place]) 01/30/2013   Generalized anxiety disorder 01/02/2013   Reflux gastritis 11/22/2011   Hypothyroidism 04/10/2010   PELVIC PAIN, CHRONIC 04/02/2010   Obesity 02/25/2010   Depression 12/30/2009   Pruritus of skin 07/28/2009   BACK PAIN, CHRONIC 07/28/2009   Essential hypertension, benign 06/24/2009    Past Surgical History:  Procedure Laterality Date   CARDIAC CATHETERIZATION N/A 02/27/2016   Procedure: Left Heart Cath and  Coronary Angiography;  Surgeon: Jettie Booze, MD;  Location: [facility];  Service: Cardiovascular;  Laterality: N/A;   CATARACT EXTRACTION Bilateral 10/10/2017    OB History   No obstetric history on file.      Home Medications    Prior to Admission medications   Medication Sig Start Date End Date Taking? Authorizing Provider  levothyroxine (SYNTHROID) 125 MCG tablet Take 2 tablets (250 mcg total) by mouth daily before breakfast. 11/24/18  Yes Newlin, Enobong, MD  PARoxetine (PAXIL) 40 MG tablet Take 1 tablet (40 mg total) by mouth daily. 11/23/18  Yes Charlott Rakes, MD  atenolol (TENORMIN) 50 MG tablet Take 1 tablet (50 mg total) by mouth 2 (two) times daily. 12/28/18   Charlott Rakes, MD  atorvastatin (LIPITOR) 20 MG tablet Take 1 tablet (20 mg total) by mouth daily. 11/24/18   Charlott Rakes, MD  hydrochlorothiazide (HYDRODIURIL) 25 MG tablet Take 1 tablet (25 mg total) by mouth daily. 11/23/18 11/18/19  Charlott Rakes, MD  hydrOXYzine (ATARAX/VISTARIL) 25 MG tablet Take 1 tablet (25 mg total) by mouth every 8 (eight) hours as needed. 11/23/18   Charlott Rakes, MD  nitroGLYCERIN (NITROSTAT) 0.4 MG SL tablet Place 1 tablet (0.4 mg total) under the tongue every 5 (five) minutes as needed. 02/24/16   Richardson Dopp T, PA-C  omeprazole (PRILOSEC) 40 MG capsule Take 1 capsule (40 mg total) by mouth daily. 09/23/17   Raylene Everts, MD  predniSONE (STERAPRED UNI-PAK 21 TAB) 10 MG (21) TBPK tablet 6 tabs for 1 day, then 5 tabs for 1 das, then 4 tabs for 1 day, then 3 tabs for 1 day, 2 tabs for 1 day, then 1 tab for 1 day 02/22/19   Dahlia Byes A, NP  tiZANidine (ZANAFLEX) 4 MG tablet Take 1 tablet (4 mg total) by mouth every 8 (eight) hours as needed for muscle spasms. 11/23/18   Hoy Register, MD  traMADol (ULTRAM) 50 MG tablet Take 1 tablet (50 mg total) by mouth every 12 (twelve) hours as needed for up to 3 days. 02/22/19 02/25/19  Janace Aris, NP    Family  History Family History  Problem Relation Age of Onset   Diabetes Other    Hypertension Other    CAD Other    Heart attack Father        died of MI age 27   Coronary artery disease Brother     Social History Social History   Tobacco Use   Smoking status: Former Smoker   Smokeless tobacco: Never Used  Substance Use Topics   Alcohol use: No   Drug use: No     Allergies   Patient has no known allergies.   Review of Systems Review of Systems   Physical Exam Triage Vital Signs ED Triage Vitals  Enc Vitals Group     BP 02/22/19 0909 (!) 148/82     Pulse Rate 02/22/19 0909 82     Resp 02/22/19 0909 16     Temp 02/22/19 0909 98.4 F (36.9 C)     Temp Source 02/22/19 0909 Oral     SpO2 02/22/19 0909 99 %     Weight --      Height --      Head Circumference --      Peak Flow --      Pain Score 02/22/19 0907 10     Pain Loc --      Pain Edu? --      Excl. in GC? --    No data found.  Updated Vital Signs BP (!) 148/82 (BP Location: Right Arm)    Pulse 82    Temp 98.4 F (36.9 C) (Oral)    Resp 16    SpO2 99%   Visual Acuity Right Eye Distance:   Left Eye Distance:   Bilateral Distance:    Right Eye Near:   Left Eye Near:    Bilateral Near:     Physical Exam Vitals signs and nursing note reviewed.  Constitutional:      General: She is not in acute distress.    Appearance: Normal appearance. She is not ill-appearing, toxic-appearing or diaphoretic.  HENT:     Head: Normocephalic.     Nose: Nose normal.     Mouth/Throat:     Pharynx: Oropharynx is clear.  Eyes:     Conjunctiva/sclera: Conjunctivae normal.  Neck:     Musculoskeletal: Normal range of motion.  Pulmonary:     Effort: Pulmonary effort is normal.  Musculoskeletal:     Right foot: Decreased range of motion. Tenderness and swelling present.       Feet:  Skin:    General: Skin is warm and dry.     Findings: Erythema present. No rash.  Neurological:     Mental Status: She is  alert.  Psychiatric:        Mood and Affect: Mood normal.      UC  Treatments / Results  Labs (all labs ordered are listed, but only abnormal results are displayed) Labs Reviewed - No data to display  EKG   Radiology No results found.  Procedures Procedures (including critical care time)  Medications Ordered in UC Medications - No data to display  Initial Impression / Assessment and Plan / UC Course  I have reviewed the triage vital signs and the nursing notes.  Pertinent labs & imaging results that were available during my care of the patient were reviewed by me and considered in my medical decision making (see chart for details).     Gout-symptoms and exam consistent with this.  Treating with prednisone taper over the next 6 days. Tramadol as needed for severe pain and rest. Recommended follow-up with her primary care for any continued or worsening problems Final Clinical Impressions(s) / UC Diagnoses   Final diagnoses:  Acute gout involving toe of right foot, unspecified cause     Discharge Instructions     This is a gout flareup. I am giving you some information about gout and the low purine eating plan Prednisone taper over the next 6 days.  Make sure you eat that you take this with food. I will give you a few stronger pain pills until the prednisone kicks in Follow-up with your doctor as needed.    ED Prescriptions    Medication Sig Dispense Auth. Provider   predniSONE (STERAPRED UNI-PAK 21 TAB) 10 MG (21) TBPK tablet 6 tabs for 1 day, then 5 tabs for 1 das, then 4 tabs for 1 day, then 3 tabs for 1 day, 2 tabs for 1 day, then 1 tab for 1 day 21 tablet Caterina Racine A, NP   traMADol (ULTRAM) 50 MG tablet Take 1 tablet (50 mg total) by mouth every 12 (twelve) hours as needed for up to 3 days. 6 tablet Dahlia ByesBast, Mykel Mohl A, NP     PDMP not reviewed this encounter.   Janace ArisBast, Quantay Zaremba A, NP 02/22/19 [phone]

## 2019-02-22 NOTE — ED Triage Notes (Signed)
Patient presents to Urgent Care with complaints of right toe joint pain since yesterday. Patient reports no injury, has not "officially" been diagnosed with gout but she was told that her uric acid levels were elevated. Pt's foot has red and swollen area at bottom of big toe.

## 2019-02-24 LAB — NOVEL CORONAVIRUS, NAA: SARS-CoV-2, NAA: NOT DETECTED

## 2019-03-28 ENCOUNTER — Other Ambulatory Visit: Payer: Self-pay

## 2019-03-28 ENCOUNTER — Ambulatory Visit: Payer: Medicare Other | Admitting: Podiatry

## 2019-03-28 ENCOUNTER — Ambulatory Visit (HOSPITAL_COMMUNITY)
Admission: EM | Admit: 2019-03-28 | Discharge: 2019-03-28 | Disposition: A | Discharge status: Home / Self Care | Payer: Medicare HMO | Attending: Family Medicine | Admitting: Family Medicine

## 2019-03-28 ENCOUNTER — Encounter (HOSPITAL_COMMUNITY): Payer: Self-pay

## 2019-03-28 DIAGNOSIS — K219 Gastro-esophageal reflux disease without esophagitis: Secondary | ICD-10-CM | POA: Diagnosis not present

## 2019-03-28 DIAGNOSIS — F3181 Bipolar II disorder: Secondary | ICD-10-CM | POA: Insufficient documentation

## 2019-03-28 DIAGNOSIS — J029 Acute pharyngitis, unspecified: Secondary | ICD-10-CM | POA: Insufficient documentation

## 2019-03-28 DIAGNOSIS — Z20828 Contact with and (suspected) exposure to other viral communicable diseases: Secondary | ICD-10-CM | POA: Insufficient documentation

## 2019-03-28 DIAGNOSIS — Z7952 Long term (current) use of systemic steroids: Secondary | ICD-10-CM | POA: Diagnosis not present

## 2019-03-28 DIAGNOSIS — Z79899 Other long term (current) drug therapy: Secondary | ICD-10-CM | POA: Diagnosis not present

## 2019-03-28 DIAGNOSIS — E669 Obesity, unspecified: Secondary | ICD-10-CM | POA: Diagnosis not present

## 2019-03-28 DIAGNOSIS — I10 Essential (primary) hypertension: Secondary | ICD-10-CM | POA: Insufficient documentation

## 2019-03-28 DIAGNOSIS — I2510 Atherosclerotic heart disease of native coronary artery without angina pectoris: Secondary | ICD-10-CM | POA: Diagnosis not present

## 2019-03-28 DIAGNOSIS — M79674 Pain in right toe(s): Secondary | ICD-10-CM | POA: Diagnosis not present

## 2019-03-28 DIAGNOSIS — F411 Generalized anxiety disorder: Secondary | ICD-10-CM | POA: Insufficient documentation

## 2019-03-28 DIAGNOSIS — Z833 Family history of diabetes mellitus: Secondary | ICD-10-CM | POA: Insufficient documentation

## 2019-03-28 DIAGNOSIS — M109 Gout, unspecified: Secondary | ICD-10-CM | POA: Diagnosis not present

## 2019-03-28 DIAGNOSIS — Z79890 Hormone replacement therapy (postmenopausal): Secondary | ICD-10-CM | POA: Insufficient documentation

## 2019-03-28 DIAGNOSIS — G8929 Other chronic pain: Secondary | ICD-10-CM | POA: Diagnosis not present

## 2019-03-28 DIAGNOSIS — E039 Hypothyroidism, unspecified: Secondary | ICD-10-CM | POA: Insufficient documentation

## 2019-03-28 DIAGNOSIS — G4730 Sleep apnea, unspecified: Secondary | ICD-10-CM | POA: Insufficient documentation

## 2019-03-28 DIAGNOSIS — Z87891 Personal history of nicotine dependence: Secondary | ICD-10-CM | POA: Diagnosis not present

## 2019-03-28 DIAGNOSIS — Z8249 Family history of ischemic heart disease and other diseases of the circulatory system: Secondary | ICD-10-CM | POA: Diagnosis not present

## 2019-03-28 DIAGNOSIS — R7302 Impaired glucose tolerance (oral): Secondary | ICD-10-CM | POA: Diagnosis not present

## 2019-03-28 LAB — POC SARS CORONAVIRUS 2 AG: SARS Coronavirus 2 Ag: NEGATIVE

## 2019-03-28 LAB — POCT RAPID STREP A: Streptococcus, Group A Screen (Direct): NEGATIVE

## 2019-03-28 LAB — POC SARS CORONAVIRUS 2 AG -  ED: SARS Coronavirus 2 Ag: NEGATIVE

## 2019-03-28 MED ORDER — METHYLPREDNISOLONE ACETATE 80 MG/ML IJ SUSP
80.0000 mg | Freq: Once | INTRAMUSCULAR | Status: AC
  Administered 2019-03-28: 80 mg via INTRAMUSCULAR

## 2019-03-28 MED ORDER — METHYLPREDNISOLONE ACETATE 80 MG/ML IJ SUSP
INTRAMUSCULAR | Status: AC
  Filled 2019-03-28: qty 1

## 2019-03-28 NOTE — Discharge Instructions (Signed)
Ice and elevate your foot to help with pain and swelling.  I am hopeful that the shot we give you today will help with the pain and swelling.  May use tylenol as needed as well.  Throat lozenges, gargles, chloraseptic spray, warm teas, popsicles etc to help with throat pain.   Your strep testing and rapid covid testing are both negative today.  Self isolate until confirmation covid results are back and negative.  Will notify you by phone of any positive findings. Your negative results will be sent through your MyChart.

## 2019-03-28 NOTE — ED Triage Notes (Signed)
Patient presents to Urgent Care with complaints of sore throat since this morning. Patient reports she was supposed to go see the foot doctor this morning for a gout flare up in her right foot but has a new sore throat so she came here.

## 2019-03-28 NOTE — ED Provider Notes (Signed)
[facility]    CSN: [id number] Arrival date & time: 03/28/19  [id number]      History   Chief Complaint Chief Complaint  Patient presents with  . Sore Throat  . Gout    HPI Cheryl Castro is a 68 y.o. female.   Cheryl Castro presents with complaints of sore throat. Woke this morning with sore throat. Burning sensation. Denies any previous similar. History  Of tonsillectomy as a child. Burning with swallowing. No fevers but noted temp is elevated from her baseline. No cough or congestion. No ear pain in particular. No gi symptoms. Her husband has been coughing, but uncertain if this has been new for him. She works in [employer], home care. Was supposed to see podiatrist today due to foot pain but came her due to sore throat. Was seen here due to gout 10/22, feels like her pain never improved. No worse. No fevers. No foot injury. Red and swollen denies any previous similar foot issues. Was provided with prednisone and tramadol but states that she didn't complete as it upset her stomach, she is uncertain which one, but she stopped them both. History  Of htn, was started on hctz but she doesn't take this because it causes leg cramps for her. Has taken her other BP medications today.     ROS per HPI, negative if not otherwise mentioned.      Past Medical History:  Diagnosis Date  . Anxiety   . GERD (gastroesophageal reflux disease)   . Glucose intolerance (impaired glucose tolerance)   . Heart murmur   . Hiatal hernia   . Hypertension   . Hypothyroidism   . Sleep apnea    no longer using CPAP    Patient Active Problem List   Diagnosis Date Noted  . 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 Numbness and tingling in left arm 02/17/2016  . Chest pain with low risk for cardiac etiology 02/05/2016  . Gastroesophageal reflux disease   . Pain in joint, ankle and foot 05/01/2015  . Healthcare maintenance 10/09/2014  . Palpitations  10/09/2014  . Bipolar II disorder (HCC) 01/30/2013  . Generalized anxiety disorder 01/02/2013  . Reflux gastritis 11/22/2011  . Hypothyroidism 04/10/2010  . PELVIC PAIN, CHRONIC 04/02/2010  . Obesity 02/25/2010  . Depression 12/30/2009  . Pruritus of skin 07/28/2009  . BACK PAIN, CHRONIC 07/28/2009  . Essential hypertension, benign 06/24/2009    Past Surgical History:  Procedure Laterality Date  . CARDIAC CATHETERIZATION N/A 02/27/2016   Procedure: Left Heart Cath and Coronary Angiography;  Surgeon: Corky CraftsJayadeep S Varanasi, MD;  [facility];  Service: Cardiovascular;  Laterality: N/A;  . CATARACT EXTRACTION Bilateral 10/10/2017    OB History   No obstetric history on file.      Home Medications    Prior to Admission medications   Medication Sig Start Date End Date Taking? Authorizing Provider  atenolol (TENORMIN) 50 MG tablet Take 1 tablet (50 mg total) by mouth 2 (two) times daily. 12/28/18   Hoy RegisterNewlin, Enobong, MD  atorvastatin (LIPITOR) 20 MG tablet Take 1 tablet (20 mg total) by mouth daily. 11/24/18   Hoy RegisterNewlin, Enobong, MD  hydrochlorothiazide (HYDRODIURIL) 25 MG tablet Take 1 tablet (25 mg total) by mouth daily. 11/23/18 11/18/19  Hoy RegisterNewlin, Enobong, MD  hydrOXYzine (ATARAX/VISTARIL) 25 MG tablet Take 1 tablet (25 mg total) by mouth every 8 (eight) hours as needed. 11/23/18   Hoy RegisterNewlin, Enobong, MD  levothyroxine (SYNTHROID) 125 MCG tablet  Take 2 tablets (250 mcg total) by mouth daily before breakfast. 11/24/18   Charlott Rakes, MD  nitroGLYCERIN (NITROSTAT) 0.4 MG SL tablet Place 1 tablet (0.4 mg total) under the tongue every 5 (five) minutes as needed. 02/24/16   Richardson Dopp T, PA-C  omeprazole (PRILOSEC) 40 MG capsule Take 1 capsule (40 mg total) by mouth daily. 09/23/17   Raylene Everts, MD  PARoxetine (PAXIL) 40 MG tablet Take 1 tablet (40 mg total) by mouth daily. 11/23/18   Charlott Rakes, MD  predniSONE (STERAPRED UNI-PAK 21 TAB) 10 MG (21) TBPK tablet 6 tabs for  1 day, then 5 tabs for 1 das, then 4 tabs for 1 day, then 3 tabs for 1 day, 2 tabs for 1 day, then 1 tab for 1 day 02/22/19   Loura Halt A, NP  tiZANidine (ZANAFLEX) 4 MG tablet Take 1 tablet (4 mg total) by mouth every 8 (eight) hours as needed for muscle spasms. 11/23/18   Charlott Rakes, MD    Family History Family History  Problem Relation Age of Onset  . Diabetes Other   . Hypertension Other   . CAD Other   . Heart attack Father        died of MI age 81  . Coronary artery disease Brother     Social History Social History   Tobacco Use  . Smoking status: Former Research scientist (life sciences)  . Smokeless tobacco: Never Used  Substance Use Topics  . Alcohol use: Yes    Comment: occ  . Drug use: No     Allergies   Patient has no known allergies.   Review of Systems Review of Systems   Physical Exam Triage Vital Signs ED Triage Vitals  Enc Vitals Group     BP 03/28/19 1033 (!) 173/96     Pulse Rate 03/28/19 1033 70     Resp 03/28/19 1033 16     Temp 03/28/19 1033 99.3 F (37.4 C)     Temp Source 03/28/19 1033 Oral     SpO2 03/28/19 1033 97 %     Weight --      Height --      Head Circumference --      Peak Flow --      Pain Score 03/28/19 1031 9     Pain Loc --      Pain Edu? --      Excl. in [place]? --    No data found.  Updated Vital Signs BP (!) 173/96 (BP Location: Left Arm)   Pulse 70   Temp 99.3 F (37.4 C) (Oral)   Resp 16   SpO2 97%    Physical Exam Constitutional:      General: She is not in acute distress.    Appearance: She is well-developed.  HENT:     Mouth/Throat:     Mouth: Mucous membranes are moist.     Pharynx: No pharyngeal swelling, oropharyngeal exudate or posterior oropharyngeal erythema.     Tonsils: 0 on the right. 0 on the left.  Cardiovascular:     Rate and Rhythm: Normal rate.     Heart sounds: Normal heart sounds.  Pulmonary:     Effort: Pulmonary effort is normal.  Musculoskeletal:     Right foot: Decreased range of motion.  Tenderness, bony tenderness and swelling present.     Comments: Redness pain, swelling, and tenderness at great toe MTP joint; cap refill < 2 seconds ; strong pedal pulse   Skin:  General: Skin is warm and dry.  Neurological:     Mental Status: She is alert and oriented to person, place, and time.      UC Treatments / Results  Labs (all labs ordered are listed, but only abnormal results are displayed) Labs Reviewed  CULTURE, GROUP A STREP ([facility])  NOVEL CORONAVIRUS, NAA (HOSP ORDER, SEND-OUT TO REF LAB; TAT 18-24 HRS)  POC SARS CORONAVIRUS 2 AG -  ED  POCT RAPID STREP A  POC SARS CORONAVIRUS 2 AG    EKG   Radiology No results found.  Procedures Procedures (including critical care time)  Medications Ordered in UC Medications  methylPREDNISolone acetate (DEPO-MEDROL) injection 80 mg (80 mg Intramuscular Given 03/28/19 1131)  methylPREDNISolone acetate (DEPO-MEDROL) 80 MG/ML injection (has no administration in time range)    Initial Impression / Assessment and Plan / UC Course  I have reviewed the triage vital signs and the nursing notes.  Pertinent labs & imaging results that were available during my care of the patient were reviewed by me and considered in my medical decision making (see chart for details).     Throat exam wnl today, negative rapid strep and negative rapid covid. Viral vs allergic discussed. Supportive cares recommended. Gout vs arthritis vs bunion with irritation to right great toe considered, as pain has been persistent for a month now. Unable to tolerate prednisone. Patient with htn and hypertension today, as well as issues with stomach upset with medications in the past deferred nsaids at this time for right foot. No indication of septic joint. im depomedrol provided and encouraged follow up with podiatry as she had previously had scheduled. Return precautions provided. Patient verbalized understanding and agreeable to plan.   Final Clinical  Impressions(s) / UC Diagnoses   Final diagnoses:  Pharyngitis, unspecified etiology  Great toe pain, right     Discharge Instructions     Ice and elevate your foot to help with pain and swelling.  I am hopeful that the shot we give you today will help with the pain and swelling.  May use tylenol as needed as well.  Throat lozenges, gargles, chloraseptic spray, warm teas, popsicles etc to help with throat pain.   Your strep testing and rapid covid testing are both negative today.  Self isolate until confirmation covid results are back and negative.  Will notify you by phone of any positive findings. Your negative results will be sent through your MyChart.        ED Prescriptions    None     PDMP not reviewed this encounter.   Georgetta Haber, NP 03/28/19 1153

## 2019-03-30 LAB — NOVEL CORONAVIRUS, NAA (HOSP ORDER, SEND-OUT TO REF LAB; TAT 18-24 HRS): SARS-CoV-2, NAA: NOT DETECTED

## 2019-03-30 LAB — CULTURE, GROUP A STREP (THRC)

## 2019-04-04 DIAGNOSIS — I10 Essential (primary) hypertension: Secondary | ICD-10-CM | POA: Diagnosis not present

## 2019-04-04 DIAGNOSIS — E039 Hypothyroidism, unspecified: Secondary | ICD-10-CM | POA: Diagnosis not present

## 2019-04-04 DIAGNOSIS — R69 Illness, unspecified: Secondary | ICD-10-CM | POA: Diagnosis not present

## 2019-04-18 DIAGNOSIS — M25579 Pain in unspecified ankle and joints of unspecified foot: Secondary | ICD-10-CM | POA: Diagnosis not present

## 2019-04-18 DIAGNOSIS — E039 Hypothyroidism, unspecified: Secondary | ICD-10-CM | POA: Diagnosis not present

## 2019-04-18 DIAGNOSIS — I10 Essential (primary) hypertension: Secondary | ICD-10-CM | POA: Diagnosis not present

## 2019-04-18 DIAGNOSIS — E7849 Other hyperlipidemia: Secondary | ICD-10-CM | POA: Diagnosis not present

## 2019-04-18 DIAGNOSIS — Z23 Encounter for immunization: Secondary | ICD-10-CM | POA: Diagnosis not present

## 2019-05-21 DIAGNOSIS — E039 Hypothyroidism, unspecified: Secondary | ICD-10-CM | POA: Diagnosis not present

## 2019-05-21 DIAGNOSIS — M10071 Idiopathic gout, right ankle and foot: Secondary | ICD-10-CM | POA: Diagnosis not present

## 2019-05-21 DIAGNOSIS — I10 Essential (primary) hypertension: Secondary | ICD-10-CM | POA: Diagnosis not present

## 2019-05-21 DIAGNOSIS — E2839 Other primary ovarian failure: Secondary | ICD-10-CM | POA: Diagnosis not present

## 2019-05-21 DIAGNOSIS — Z1239 Encounter for other screening for malignant neoplasm of breast: Secondary | ICD-10-CM | POA: Diagnosis not present

## 2019-05-29 ENCOUNTER — Other Ambulatory Visit: Payer: Self-pay | Admitting: Internal Medicine

## 2019-05-29 DIAGNOSIS — E2839 Other primary ovarian failure: Secondary | ICD-10-CM

## 2019-11-13 ENCOUNTER — Ambulatory Visit (HOSPITAL_COMMUNITY)
Admission: EM | Admit: 2019-11-13 | Discharge: 2019-11-13 | Disposition: A | Discharge status: Home / Self Care | Payer: Medicare HMO | Attending: Family Medicine | Admitting: Family Medicine

## 2019-11-13 ENCOUNTER — Encounter (HOSPITAL_COMMUNITY): Payer: Self-pay | Admitting: Emergency Medicine

## 2019-11-13 ENCOUNTER — Other Ambulatory Visit: Payer: Self-pay

## 2019-11-13 DIAGNOSIS — K047 Periapical abscess without sinus: Secondary | ICD-10-CM | POA: Diagnosis not present

## 2019-11-13 MED ORDER — HYDROCODONE-ACETAMINOPHEN 5-325 MG PO TABS: 1.0000 | ORAL_TABLET | Freq: Four times a day (QID) | ORAL | 0 refills | Status: DC | PRN

## 2019-11-13 MED ORDER — AMOXICILLIN-POT CLAVULANATE 875-125 MG PO TABS: 1.0000 | ORAL_TABLET | Freq: Two times a day (BID) | ORAL | 0 refills | Status: DC

## 2019-11-13 MED ORDER — IBUPROFEN 600 MG PO TABS: 600.0000 mg | ORAL_TABLET | Freq: Three times a day (TID) | ORAL | 0 refills | Status: DC | PRN

## 2019-11-13 NOTE — Discharge Instructions (Addendum)
Treating you for dental infection.  Take the antibiotics as prescribed.  Ibuprofen every 8 hours for mild to moderate pain and hydrocodone for more severe pain. Please follow-up with dentist for further management

## 2019-11-13 NOTE — ED Triage Notes (Addendum)
Toothache 2 days ago. Patient thinks it is a bottom, right tooth (second from the back). Now pain into right side of throat, right ear pain, and knot on right side of neck is tender.    Patient questions if she may have covid test since she called in to work, work is requesting one.

## 2019-11-14 NOTE — ED Provider Notes (Signed)
[facility]    CSN: [id number] Arrival date & time: 11/13/19  1232      History   Chief Complaint Chief Complaint  Patient presents with  . Dental Pain    HPI Cheryl Castro is a 69 y.o. female.   Patient is a 69 year old female presents today with dental pain x2 days.  Pain is located to the bottom right of mouth.  Pain is been constant.  Has known dental caries and "bad tooth".  She also has some pain radiating to the jaw and ear area.  Mild facial swelling.  No fevers, chills, trismus  ROS per HPI      Past Medical History:  Diagnosis Date  . Anxiety   . GERD (gastroesophageal reflux disease)   . Glucose intolerance (impaired glucose tolerance)   . Heart murmur   . Hiatal hernia   . Hypertension   . Hypothyroidism   . Sleep apnea    no longer using CPAP    Patient Active Problem List   Diagnosis Date Noted  . 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 Numbness and tingling in left arm 02/17/2016  . Chest pain with low risk for cardiac etiology 02/05/2016  . Gastroesophageal reflux disease   . Pain in joint, ankle and foot 05/01/2015  . Healthcare maintenance 10/09/2014  . Palpitations 10/09/2014  . Bipolar II disorder (HCC) 01/30/2013  . Generalized anxiety disorder 01/02/2013  . Reflux gastritis 11/22/2011  . Hypothyroidism 04/10/2010  . PELVIC PAIN, CHRONIC 04/02/2010  . Obesity 02/25/2010  . Depression 12/30/2009  . Pruritus of skin 07/28/2009  . BACK PAIN, CHRONIC 07/28/2009  . Essential hypertension, benign 06/24/2009    Past Surgical History:  Procedure Laterality Date  . CARDIAC CATHETERIZATION N/A 02/27/2016   Procedure: Left Heart Cath and Coronary Angiography;  Surgeon: Corky Crafts, MD;  Location: [facility];  Service: Cardiovascular;  Laterality: N/A;  . CATARACT EXTRACTION Bilateral 10/10/2017    OB History   No obstetric history on file.      Home Medications    Prior to  Admission medications   Medication Sig Start Date End Date Taking? Authorizing Provider  atenolol (TENORMIN) 50 MG tablet Take 1 tablet (50 mg total) by mouth 2 (two) times daily. 12/28/18  Yes Hoy Register, MD  hydrochlorothiazide (HYDRODIURIL) 25 MG tablet Take 1 tablet (25 mg total) by mouth daily. 11/23/18 11/18/19 Yes Hoy Register, MD  hydrOXYzine (ATARAX/VISTARIL) 25 MG tablet Take 1 tablet (25 mg total) by mouth every 8 (eight) hours as needed. 11/23/18  Yes Hoy Register, MD  levothyroxine (SYNTHROID) 125 MCG tablet Take 2 tablets (250 mcg total) by mouth daily before breakfast. 11/24/18  Yes Newlin, Enobong, MD  omeprazole (PRILOSEC) 40 MG capsule Take 1 capsule (40 mg total) by mouth daily. 09/23/17  Yes Eustace Moore, MD  PARoxetine (PAXIL) 40 MG tablet Take 1 tablet (40 mg total) by mouth daily. 11/23/18  Yes Hoy Register, MD  amoxicillin-clavulanate (AUGMENTIN) 875-125 MG tablet Take 1 tablet by mouth every 12 (twelve) hours. 11/13/19   Dahlia Byes A, NP  HYDROcodone-acetaminophen (NORCO/VICODIN) 5-325 MG tablet Take 1 tablet by mouth every 6 (six) hours as needed. 11/13/19   Dahlia Byes A, NP  ibuprofen (ADVIL) 600 MG tablet Take 1 tablet (600 mg total) by mouth every 8 (eight) hours as needed for moderate pain. 11/13/19   Dahlia Byes A, NP  nitroGLYCERIN (NITROSTAT) 0.4 MG SL tablet Place 1 tablet (0.4 mg  total) under the tongue every 5 (five) minutes as needed. 02/24/16   Tereso Newcomer T, PA-C  tiZANidine (ZANAFLEX) 4 MG tablet Take 1 tablet (4 mg total) by mouth every 8 (eight) hours as needed for muscle spasms. 11/23/18   Hoy Register, MD  atorvastatin (LIPITOR) 20 MG tablet Take 1 tablet (20 mg total) by mouth daily. Patient not taking: Reported on 11/13/2019 11/24/18 11/13/19  Hoy Register, MD    Family History Family History  Problem Relation Age of Onset  . Diabetes Other   . Hypertension Other   . CAD Other   . Heart attack Father        died of MI age 84  .  Coronary artery disease Brother     Social History Social History   Tobacco Use  . Smoking status: Former Games developer  . Smokeless tobacco: Never Used  Vaping Use  . Vaping Use: Never used  Substance Use Topics  . Alcohol use: Yes    Comment: occ  . Drug use: No     Allergies   Patient has no known allergies.   Review of Systems Review of Systems   Physical Exam Triage Vital Signs ED Triage Vitals  Enc Vitals Group     BP 11/13/19 1358 (!) 163/85     Pulse Rate 11/13/19 1358 66     Resp 11/13/19 1358 20     Temp 11/13/19 1358 98.5 F (36.9 C)     Temp Source 11/13/19 1358 Oral     SpO2 11/13/19 1358 98 %     Weight --      Height --      Head Circumference --      Peak Flow --      Pain Score 11/13/19 1352 8     Pain Loc --      Pain Edu? --      Excl. in [place]? --    No data found.  Updated Vital Signs BP (!) 163/85 (BP Location: Right Arm) Comment (BP Location): large cuff  Pulse 66   Temp 98.5 F (36.9 C) (Oral)   Resp 20   SpO2 98%   Visual Acuity Right Eye Distance:   Left Eye Distance:   Bilateral Distance:    Right Eye Near:   Left Eye Near:    Bilateral Near:     Physical Exam Vitals and nursing note reviewed.  Constitutional:      General: She is not in acute distress.    Appearance: Normal appearance. She is not ill-appearing, toxic-appearing or diaphoretic.  HENT:     Head: Normocephalic.     Right Ear: Tympanic membrane, ear canal and external ear normal.     Left Ear: Tympanic membrane, ear canal and external ear normal.     Nose: Nose normal.     Mouth/Throat:     Dentition: Dental tenderness, gingival swelling and dental caries present.     Pharynx: Oropharynx is clear.   Eyes:     Conjunctiva/sclera: Conjunctivae normal.  Pulmonary:     Effort: Pulmonary effort is normal.  Musculoskeletal:        General: Normal range of motion.     Cervical back: Normal range of motion.  Lymphadenopathy:     Cervical: Cervical adenopathy  present.  Skin:    General: Skin is warm and dry.     Findings: No rash.  Neurological:     Mental Status: She is alert.  Psychiatric:  Mood and Affect: Mood normal.      UC Treatments / Results  Labs (all labs ordered are listed, but only abnormal results are displayed) Labs Reviewed - No data to display  EKG   Radiology No results found.  Procedures Procedures (including critical care time)  Medications Ordered in UC Medications - No data to display  Initial Impression / Assessment and Plan / UC Course  I have reviewed the triage vital signs and the nursing notes.  Pertinent labs & imaging results that were available during my care of the patient were reviewed by me and considered in my medical decision making (see chart for details).     Dental infection Treating with amoxicillin.  600 ibuprofen for pain, mild to moderate and hydrocodone for more severe pain. Recommended follow-up with dentist for further management Final Clinical Impressions(s) / UC Diagnoses   Final diagnoses:  Dental infection     Discharge Instructions     Treating you for dental infection.  Take the antibiotics as prescribed.  Ibuprofen every 8 hours for mild to moderate pain and hydrocodone for more severe pain. Please follow-up with dentist for further management    ED Prescriptions    Medication Sig Dispense Auth. Provider   amoxicillin-clavulanate (AUGMENTIN) 875-125 MG tablet Take 1 tablet by mouth every 12 (twelve) hours. 14 tablet Tytan Sandate A, NP   ibuprofen (ADVIL) 600 MG tablet Take 1 tablet (600 mg total) by mouth every 8 (eight) hours as needed for moderate pain. 30 tablet Alianna Wurster A, NP   HYDROcodone-acetaminophen (NORCO/VICODIN) 5-325 MG tablet Take 1 tablet by mouth every 6 (six) hours as needed. 10 tablet Mechel Haggard A, NP     I have reviewed the PDMP during this encounter.   Janace Aris, NP 11/14/19 [phone]

## 2020-01-11 ENCOUNTER — Other Ambulatory Visit: Payer: Self-pay

## 2020-01-11 ENCOUNTER — Ambulatory Visit (HOSPITAL_COMMUNITY)
Admission: EM | Admit: 2020-01-11 | Discharge: 2020-01-11 | Disposition: A | Discharge status: Home / Self Care | Payer: Medicare HMO

## 2020-01-25 DIAGNOSIS — R1012 Left upper quadrant pain: Secondary | ICD-10-CM | POA: Diagnosis not present

## 2020-01-25 DIAGNOSIS — E039 Hypothyroidism, unspecified: Secondary | ICD-10-CM | POA: Diagnosis not present

## 2020-01-25 DIAGNOSIS — I10 Essential (primary) hypertension: Secondary | ICD-10-CM | POA: Diagnosis not present

## 2020-01-25 DIAGNOSIS — R69 Illness, unspecified: Secondary | ICD-10-CM | POA: Diagnosis not present

## 2020-01-29 DIAGNOSIS — N390 Urinary tract infection, site not specified: Secondary | ICD-10-CM | POA: Diagnosis not present

## 2020-02-11 DIAGNOSIS — Z131 Encounter for screening for diabetes mellitus: Secondary | ICD-10-CM | POA: Diagnosis not present

## 2020-02-11 DIAGNOSIS — Z0000 Encounter for general adult medical examination without abnormal findings: Secondary | ICD-10-CM | POA: Diagnosis not present

## 2020-02-11 DIAGNOSIS — I10 Essential (primary) hypertension: Secondary | ICD-10-CM | POA: Diagnosis not present

## 2020-02-11 DIAGNOSIS — E7849 Other hyperlipidemia: Secondary | ICD-10-CM | POA: Diagnosis not present

## 2020-02-11 DIAGNOSIS — L299 Pruritus, unspecified: Secondary | ICD-10-CM | POA: Diagnosis not present

## 2020-02-11 DIAGNOSIS — E559 Vitamin D deficiency, unspecified: Secondary | ICD-10-CM | POA: Diagnosis not present

## 2020-02-11 DIAGNOSIS — M10071 Idiopathic gout, right ankle and foot: Secondary | ICD-10-CM | POA: Diagnosis not present

## 2020-02-11 DIAGNOSIS — E039 Hypothyroidism, unspecified: Secondary | ICD-10-CM | POA: Diagnosis not present

## 2020-04-18 DIAGNOSIS — E7849 Other hyperlipidemia: Secondary | ICD-10-CM | POA: Diagnosis not present

## 2020-04-18 DIAGNOSIS — M169 Osteoarthritis of hip, unspecified: Secondary | ICD-10-CM | POA: Diagnosis not present

## 2020-04-18 DIAGNOSIS — I10 Essential (primary) hypertension: Secondary | ICD-10-CM | POA: Diagnosis not present

## 2020-04-18 DIAGNOSIS — E039 Hypothyroidism, unspecified: Secondary | ICD-10-CM | POA: Diagnosis not present

## 2020-04-18 DIAGNOSIS — Z6841 Body Mass Index (BMI) 40.0 and over, adult: Secondary | ICD-10-CM | POA: Diagnosis not present

## 2020-04-18 DIAGNOSIS — R69 Illness, unspecified: Secondary | ICD-10-CM | POA: Diagnosis not present

## 2021-01-07 ENCOUNTER — Other Ambulatory Visit: Payer: Self-pay | Admitting: Internal Medicine

## 2021-01-08 ENCOUNTER — Other Ambulatory Visit: Payer: Self-pay | Admitting: Internal Medicine

## 2021-01-08 DIAGNOSIS — N6311 Unspecified lump in the right breast, upper outer quadrant: Secondary | ICD-10-CM

## 2021-01-08 LAB — COMPLETE METABOLIC PANEL WITH GFR
AG Ratio: 1.3 (calc) (ref 1.0–2.5)
ALT: 25 U/L (ref 6–29)
AST: 20 U/L (ref 10–35)
Albumin: 4.3 g/dL (ref 3.6–5.1)
Alkaline phosphatase (APISO): 81 U/L (ref 37–153)
BUN: 20 mg/dL (ref 7–25)
CO2: 22 mmol/L (ref 20–32)
Calcium: 10 mg/dL (ref 8.6–10.4)
Chloride: 99 mmol/L (ref 98–110)
Creat: 0.77 mg/dL (ref 0.50–1.05)
Globulin: 3.4 g/dL (calc) (ref 1.9–3.7)
Glucose, Bld: 76 mg/dL (ref 65–99)
Potassium: 4.2 mmol/L (ref 3.5–5.3)
Sodium: 136 mmol/L (ref 135–146)
Total Bilirubin: 0.5 mg/dL (ref 0.2–1.2)
Total Protein: 7.7 g/dL (ref 6.1–8.1)
eGFR: 84 mL/min/{1.73_m2} (ref >=60)

## 2021-01-08 LAB — VITAMIN D 25 HYDROXY (VIT D DEFICIENCY, FRACTURES): Vit D, 25-Hydroxy: 16 ng/mL — ABNORMAL LOW (ref 30–100)

## 2021-01-08 LAB — CBC
HCT: 45.2 % — ABNORMAL HIGH (ref 35.0–45.0)
Hemoglobin: 14.5 g/dL (ref 11.7–15.5)
MCH: 28.4 pg (ref 27.0–33.0)
MCHC: 32.1 g/dL (ref 32.0–36.0)
MCV: 88.6 fL (ref 80.0–100.0)
MPV: 10 fL (ref 7.5–12.5)
Platelets: 349 10*3/uL (ref 140–400)
RBC: 5.1 10*6/uL (ref 3.80–5.10)
RDW: 12.5 % (ref 11.0–15.0)
WBC: 6.6 10*3/uL (ref 3.8–10.8)

## 2021-01-08 LAB — LIPID PANEL
Cholesterol: 208 mg/dL — ABNORMAL HIGH (ref <200)
HDL: 60 mg/dL (ref >=50)
LDL Cholesterol (Calc): 126 mg/dL (calc) — ABNORMAL HIGH
Non-HDL Cholesterol (Calc): 148 mg/dL (calc) — ABNORMAL HIGH (ref <130)
Total CHOL/HDL Ratio: 3.5 (calc) (ref <5.0)
Triglycerides: 116 mg/dL (ref <150)

## 2021-01-08 LAB — T4, FREE: Free T4: 1 ng/dL (ref 0.8–1.8)

## 2021-01-08 LAB — TSH: TSH: 4.36 mIU/L (ref 0.40–4.50)

## 2021-01-08 LAB — URIC ACID: Uric Acid, Serum: 8.3 mg/dL — ABNORMAL HIGH (ref 2.5–7.0)

## 2021-01-24 ENCOUNTER — Other Ambulatory Visit: Payer: Medicare HMO

## 2021-02-09 ENCOUNTER — Ambulatory Visit: Payer: Medicare HMO

## 2021-02-27 ENCOUNTER — Other Ambulatory Visit: Payer: Self-pay

## 2021-02-27 ENCOUNTER — Ambulatory Visit (HOSPITAL_COMMUNITY)
Admission: EM | Admit: 2021-02-27 | Discharge: 2021-02-27 | Disposition: A | Discharge status: Home / Self Care | Payer: Medicare HMO | Attending: Emergency Medicine | Admitting: Emergency Medicine

## 2021-02-27 ENCOUNTER — Encounter (HOSPITAL_COMMUNITY): Payer: Self-pay

## 2021-02-27 DIAGNOSIS — Z20822 Contact with and (suspected) exposure to covid-19: Secondary | ICD-10-CM | POA: Insufficient documentation

## 2021-02-27 DIAGNOSIS — J069 Acute upper respiratory infection, unspecified: Secondary | ICD-10-CM | POA: Insufficient documentation

## 2021-02-27 LAB — SARS CORONAVIRUS 2 (TAT 6-24 HRS): SARS Coronavirus 2: NEGATIVE

## 2021-02-27 LAB — POC INFLUENZA A AND B ANTIGEN (URGENT CARE ONLY)
INFLUENZA A ANTIGEN, POC: NEGATIVE
INFLUENZA B ANTIGEN, POC: NEGATIVE

## 2021-02-27 MED ORDER — FLUTICASONE PROPIONATE 50 MCG/ACT NA SUSP: 2.0000 | Freq: Every day | NASAL | 0 refills | Status: DC

## 2021-02-27 MED ORDER — HYDROCOD POLST-CPM POLST ER 10-8 MG/5ML PO SUER: 5.0000 mL | Freq: Two times a day (BID) | ORAL | 0 refills | Status: DC | PRN

## 2021-02-27 NOTE — Discharge Instructions (Addendum)
I will contact you if your flu is positive and will call in some Tamiflu.  If you do not hear from you by the end of the day, you may assume that your flu is negative.  You can also call here and get your result.  COVID will be back tomorrow.  I will prescribe Molnupiravir if it is positive.f COVID is positive, will prescribe Molnupiravir due to hypertension, coronary disease and BMI above 30.  In the meantime, supportive treatment with Flonase, saline nasal irrigation with a Lloyd Huger Med rinse and distilled water as often as you, Tylenol 1000 mg 3-4 times a day.  Tussionex for the cough and Mucinex for the nasal chest congestion.

## 2021-02-27 NOTE — ED Triage Notes (Signed)
Pt presents with non productive cough, congestion, wheezing, shortness of breath and generalized body aches X 2 days.

## 2021-02-27 NOTE — ED Provider Notes (Signed)
HPI  SUBJECTIVE:  Cheryl Castro is a 70 y.o. female who presents with 2 days of chest/head congestion, body aches, headaches, sinus pain and pressure, postnasal drip, loss of sense of smell and taste, wheezing, chest soreness from the coughing, diarrhea.  States that she is unable to sleep at night secondary to the cough.  No fevers, sore throat, facial swelling, upper dental pain, shortness of breath, nausea, vomiting, abdominal pain.  No known COVID, flu, RSV exposure.  She got the COVID booster and the flu vaccine.  No antipyretic in the past 6 hours.  No antibiotics in the past month.  She has tried 1000 mg of Tylenol, TheraFlu and NyQuil without improvement in her symptoms.  Cough is worse with lying down.  She has a past medical history of hypertension, GERD, coronary artery disease.  GDJ:MEQASTM, Dorma Russell, MD  Past Medical History:  Diagnosis Date   Anxiety    GERD (gastroesophageal reflux disease)    Glucose intolerance (impaired glucose tolerance)    Heart murmur    Hiatal hernia    Hypertension    Hypothyroidism    Sleep apnea    no longer using CPAP    Past Surgical History:  Procedure Laterality Date   CARDIAC CATHETERIZATION N/A 02/27/2016   Procedure: Left Heart Cath and Coronary Angiography;  Surgeon: Corky Crafts, MD;  Location: [facility];  Service: Cardiovascular;  Laterality: N/A;   CATARACT EXTRACTION Bilateral 10/10/2017    Family History  Problem Relation Age of Onset   Diabetes Other    Hypertension Other    CAD Other    Heart attack Father        died of MI age 55   Coronary artery disease Brother     Social History   Tobacco Use   Smoking status: Former   Smokeless tobacco: Never  Building services engineer Use: Never used  Substance Use Topics   Alcohol use: Yes    Comment: occ   Drug use: No    No current facility-administered medications for this encounter.  Current Outpatient Medications:    chlorpheniramine-HYDROcodone  (TUSSIONEX PENNKINETIC ER) 10-8 MG/5ML SUER, Take 5 mLs by mouth every 12 (twelve) hours as needed for cough., Disp: 60 mL, Rfl: 0   fluticasone (FLONASE) 50 MCG/ACT nasal spray, Place 2 sprays into both nostrils daily., Disp: 16 g, Rfl: 0   atenolol (TENORMIN) 50 MG tablet, Take 1 tablet (50 mg total) by mouth 2 (two) times daily., Disp: 180 tablet, Rfl: 0   hydrochlorothiazide (HYDRODIURIL) 25 MG tablet, Take 1 tablet (25 mg total) by mouth daily., Disp: 90 tablet, Rfl: 6   hydrOXYzine (ATARAX/VISTARIL) 25 MG tablet, Take 1 tablet (25 mg total) by mouth every 8 (eight) hours as needed., Disp: 60 tablet, Rfl: 6   levothyroxine (SYNTHROID) 125 MCG tablet, Take 2 tablets (250 mcg total) by mouth daily before breakfast., Disp: 60 tablet, Rfl: 3   nitroGLYCERIN (NITROSTAT) 0.4 MG SL tablet, Place 1 tablet (0.4 mg total) under the tongue every 5 (five) minutes as needed., Disp: 25 tablet, Rfl: 3   omeprazole (PRILOSEC) 40 MG capsule, Take 1 capsule (40 mg total) by mouth daily., Disp: 30 capsule, Rfl: 1   PARoxetine (PAXIL) 40 MG tablet, Take 1 tablet (40 mg total) by mouth daily., Disp: 30 tablet, Rfl: 6   tiZANidine (ZANAFLEX) 4 MG tablet, Take 1 tablet (4 mg total) by mouth every 8 (eight) hours as needed for muscle spasms., Disp: 60  tablet, Rfl: 1  No Known Allergies   ROS  As noted in HPI.   Physical Exam  BP (!) 186/88 (BP Location: Right Arm)   Pulse 61   Temp 98.2 F (36.8 C) (Oral)   Resp 17   SpO2 96%   Constitutional: Well developed, well nourished, no acute distress.  Coughing. Eyes:  EOMI, conjunctiva normal bilaterally HENT: Normocephalic, atraumatic,mucus membranes moist.  Clear nasal congestion.  Erythematous, swollen turbinates.  Very mild maxillary sinus tenderness.  No frontal sinus tenderness.  Normal oropharynx.  No obvious postnasal drip. Neck: No cervical adenopathy Respiratory: Normal inspiratory effort, lungs clear bilaterally.  No anterior lateral chest wall  tenderness Cardiovascular: Normal rate, regular rhythm, no murmurs rubs or gallops. GI: nondistended skin: No rash, skin intact Musculoskeletal: no deformities Neurologic: Alert & oriented x 3, no focal neuro deficits Psychiatric: Speech and behavior appropriate   ED Course   Medications - No data to display  Orders Placed This Encounter  Procedures   SARS CORONAVIRUS 2 (TAT 6-24 HRS) Nasopharyngeal Nasopharyngeal Swab    Standing Status:   Standing    Number of Occurrences:   1   POC Influenza A & B Ag (Urgent Care)    Standing Status:   Standing    Number of Occurrences:   1    Results for orders placed or performed during the hospital encounter of 02/27/21 (from the past 24 hour(s))  SARS CORONAVIRUS 2 (TAT 6-24 HRS) Nasopharyngeal Nasopharyngeal Swab     Status: None   Collection Time: 02/27/21 10:07 AM   Specimen: Nasopharyngeal Swab  Result Value Ref Range   SARS Coronavirus 2 NEGATIVE NEGATIVE  POC Influenza A & B Ag (Urgent Care)     Status: None   Collection Time: 02/27/21 10:17 AM  Result Value Ref Range   INFLUENZA A ANTIGEN, POC NEGATIVE NEGATIVE   INFLUENZA B ANTIGEN, POC NEGATIVE NEGATIVE   No results found.  ED Clinical Impression  1. Upper respiratory infection with cough and congestion   2. Encounter for laboratory testing for COVID-19 virus      ED Assessment/Plan  [place] Narcotic database reviewed for this patient, and feel that the risk/benefit ratio today is favorable for proceeding with a prescription for controlled substance.  No opiate prescriptions since 7/21  COVID sent.  Rapid flu negative.  Discussed this with patient.  If COVID is positive, will prescribe Molnupiravir due to hypertension, coronary disease and BMI above 30.  In the meantime, supportive treatment with Flonase, saline nasal irrigation, Tylenol 1000 mg 3-4 times a day.  Tussionex for the cough and Mucinex for the chest congestion.  Work note.  COVID-negative.  Plan as  above  Discussed labs, MDM, treatment plan, and plan for follow-up with patient.  patient agrees with plan.   Meds ordered this encounter  Medications   fluticasone (FLONASE) 50 MCG/ACT nasal spray    Sig: Place 2 sprays into both nostrils daily.    Dispense:  16 g    Refill:  0   chlorpheniramine-HYDROcodone (TUSSIONEX PENNKINETIC ER) 10-8 MG/5ML SUER    Sig: Take 5 mLs by mouth every 12 (twelve) hours as needed for cough.    Dispense:  60 mL    Refill:  0      *This clinic note was created using Scientist, clinical (histocompatibility and immunogenetics). Therefore, there may be occasional mistakes despite careful proofreading.  ?    Domenick Gong, MD 02/28/21 [phone]

## 2021-03-13 ENCOUNTER — Other Ambulatory Visit: Payer: Medicare HMO

## 2021-04-28 ENCOUNTER — Other Ambulatory Visit: Payer: Medicare HMO

## 2021-06-04 ENCOUNTER — Other Ambulatory Visit: Payer: Self-pay

## 2021-06-04 ENCOUNTER — Ambulatory Visit (HOSPITAL_COMMUNITY)
Admission: EM | Admit: 2021-06-04 | Discharge: 2021-06-04 | Disposition: A | Discharge status: Home / Self Care | Payer: Medicare HMO | Attending: Family Medicine | Admitting: Family Medicine

## 2021-06-04 ENCOUNTER — Encounter (HOSPITAL_COMMUNITY): Payer: Self-pay

## 2021-06-04 DIAGNOSIS — S46811A Strain of other muscles, fascia and tendons at shoulder and upper arm level, right arm, initial encounter: Secondary | ICD-10-CM | POA: Diagnosis not present

## 2021-06-04 DIAGNOSIS — M542 Cervicalgia: Secondary | ICD-10-CM | POA: Diagnosis not present

## 2021-06-04 MED ORDER — CYCLOBENZAPRINE HCL 10 MG PO TABS: 10.0000 mg | ORAL_TABLET | Freq: Two times a day (BID) | ORAL | 0 refills | Status: DC | PRN
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
## 2021-06-04 NOTE — ED Triage Notes (Signed)
Pt presents with c/o R shoulder pain and states it has been going on for 4 days. Pt states it did not start feeling worse until last night.

## 2021-06-23 ENCOUNTER — Other Ambulatory Visit: Payer: Self-pay | Admitting: Internal Medicine

## 2021-06-23 DIAGNOSIS — E2839 Other primary ovarian failure: Secondary | ICD-10-CM

## 2021-07-20 ENCOUNTER — Other Ambulatory Visit: Payer: Self-pay

## 2021-07-20 ENCOUNTER — Ambulatory Visit
Admission: RE | Admit: 2021-07-20 | Discharge: 2021-07-20 | Disposition: A | Discharge status: Home / Self Care | Payer: Medicare HMO | Source: Ambulatory Visit | Attending: Internal Medicine | Admitting: Internal Medicine

## 2021-07-20 DIAGNOSIS — N6311 Unspecified lump in the right breast, upper outer quadrant: Secondary | ICD-10-CM

## 2021-09-01 ENCOUNTER — Ambulatory Visit: Payer: Medicare HMO | Admitting: Obstetrics and Gynecology

## 2021-11-20 ENCOUNTER — Other Ambulatory Visit: Payer: Medicare HMO

## 2022-01-28 ENCOUNTER — Other Ambulatory Visit: Payer: Self-pay | Admitting: Internal Medicine

## 2022-01-29 LAB — LIPID PANEL
Cholesterol: 202 mg/dL — ABNORMAL HIGH (ref <200)
HDL: 53 mg/dL (ref >=50)
LDL Cholesterol (Calc): 119 mg/dL (calc) — ABNORMAL HIGH
Non-HDL Cholesterol (Calc): 149 mg/dL (calc) — ABNORMAL HIGH (ref <130)
Total CHOL/HDL Ratio: 3.8 (calc) (ref <5.0)
Triglycerides: 188 mg/dL — ABNORMAL HIGH (ref <150)

## 2022-01-29 LAB — CBC
HCT: 42.9 % (ref 35.0–45.0)
Hemoglobin: 14.3 g/dL (ref 11.7–15.5)
MCH: 30.8 pg (ref 27.0–33.0)
MCHC: 33.3 g/dL (ref 32.0–36.0)
MCV: 92.3 fL (ref 80.0–100.0)
MPV: 9.8 fL (ref 7.5–12.5)
Platelets: 297 10*3/uL (ref 140–400)
RBC: 4.65 10*6/uL (ref 3.80–5.10)
RDW: 12.8 % (ref 11.0–15.0)
WBC: 4.8 10*3/uL (ref 3.8–10.8)

## 2022-01-29 LAB — COMPLETE METABOLIC PANEL WITH GFR
AG Ratio: 1.4 (calc) (ref 1.0–2.5)
ALT: 25 U/L (ref 6–29)
AST: 22 U/L (ref 10–35)
Albumin: 4.2 g/dL (ref 3.6–5.1)
Alkaline phosphatase (APISO): 62 U/L (ref 37–153)
BUN: 21 mg/dL (ref 7–25)
CO2: 25 mmol/L (ref 20–32)
Calcium: 9.6 mg/dL (ref 8.6–10.4)
Chloride: 104 mmol/L (ref 98–110)
Creat: 0.83 mg/dL (ref 0.50–1.05)
Globulin: 2.9 g/dL (calc) (ref 1.9–3.7)
Glucose, Bld: 110 mg/dL — ABNORMAL HIGH (ref 65–99)
Potassium: 4.5 mmol/L (ref 3.5–5.3)
Sodium: 139 mmol/L (ref 135–146)
Total Bilirubin: 0.3 mg/dL (ref 0.2–1.2)
Total Protein: 7.1 g/dL (ref 6.1–8.1)
eGFR: 76 mL/min/{1.73_m2} (ref >=60)

## 2022-01-29 LAB — T4, FREE: Free T4: 0.9 ng/dL (ref 0.8–1.8)

## 2022-01-29 LAB — TSH: TSH: 2.06 mIU/L (ref 0.40–4.50)

## 2022-01-29 LAB — FOLATE: Folate: 6.7 ng/mL

## 2022-01-29 LAB — VITAMIN D 25 HYDROXY (VIT D DEFICIENCY, FRACTURES): Vit D, 25-Hydroxy: 17 ng/mL — ABNORMAL LOW (ref 30–100)

## 2022-01-29 LAB — URIC ACID: Uric Acid, Serum: 7.9 mg/dL — ABNORMAL HIGH (ref 2.5–7.0)

## 2022-01-29 LAB — VITAMIN B12: Vitamin B-12: 494 pg/mL (ref 200–1100)

## 2022-08-26 ENCOUNTER — Ambulatory Visit (HOSPITAL_COMMUNITY)
Admission: EM | Admit: 2022-08-26 | Discharge: 2022-08-26 | Disposition: A | Discharge status: Home / Self Care | Payer: Medicare HMO | Attending: Nurse Practitioner | Admitting: Nurse Practitioner

## 2022-08-26 ENCOUNTER — Encounter (HOSPITAL_COMMUNITY): Payer: Self-pay

## 2022-08-26 DIAGNOSIS — M109 Gout, unspecified: Secondary | ICD-10-CM

## 2022-08-26 HISTORY — DX: Gout, unspecified: M10.9

## 2022-08-26 MED ORDER — DEXAMETHASONE SODIUM PHOSPHATE 10 MG/ML IJ SOLN
10.0000 mg | Freq: Once | INTRAMUSCULAR | Status: AC
Start: 2022-08-26 — End: 2022-08-26
  Administered 2022-08-26: 10 mg via INTRAMUSCULAR

## 2022-08-26 MED ORDER — KETOROLAC TROMETHAMINE 60 MG/2ML IM SOLN
INTRAMUSCULAR | Status: AC
  Filled 2022-08-26: qty 2

## 2022-08-26 MED ORDER — KETOROLAC TROMETHAMINE 60 MG/2ML IM SOLN
60.0000 mg | Freq: Once | INTRAMUSCULAR | Status: AC
  Administered 2022-08-26: 60 mg via INTRAMUSCULAR

## 2022-08-26 MED ORDER — HYDROCODONE-ACETAMINOPHEN 5-325 MG PO TABS
1.0000 | ORAL_TABLET | Freq: Four times a day (QID) | ORAL | 0 refills | Status: DC | PRN
Start: 2022-08-26 — End: 2023-02-23

## 2022-08-26 MED ORDER — METHYLPREDNISOLONE 4 MG PO TBPK: ORAL_TABLET | ORAL | 0 refills | Status: DC

## 2022-08-26 MED ORDER — NAPROXEN 500 MG PO TABS: 500.0000 mg | ORAL_TABLET | Freq: Two times a day (BID) | ORAL | 0 refills | Status: DC

## 2022-08-26 MED ORDER — ALLOPURINOL 100 MG PO TABS: 100.0000 mg | ORAL_TABLET | Freq: Every day | ORAL | 0 refills | Status: AC

## 2022-08-26 MED ORDER — DEXAMETHASONE SODIUM PHOSPHATE 10 MG/ML IJ SOLN
INTRAMUSCULAR | Status: AC
  Filled 2022-08-26: qty 1

## 2022-08-26 NOTE — ED Triage Notes (Signed)
Patient c/o bilateral foot pain x 2 days R>L. Patient states she was taking colchicine and that was not helping so she took Allopurinol 1 tab only yesterday afternoon. Patient also reports that she took diclofenac this AM with no relief.

## 2022-08-26 NOTE — ED Provider Notes (Signed)
[facility]    CSN: [id number] Arrival date & time: 08/26/22  1523      History   Chief Complaint Chief Complaint  Patient presents with   Foot Pain    HPI Cheryl Castro is a 72 y.o. female.   Subjective:   Cheryl Castro is a 72 y.o. female who presents with possible gout flare.  Patient does have a history of gout. Pain is located to the right great toe and across the anterior foot. Symptoms have been present for 2 days and getting worse. Pain is described as aching, pulsating, and throbbing. Pain is constant and worse with weight bearing. Associated symptoms include edema, erythema, tenderness, and warmth. The patient has tried diclofenac and colchicine without any relief in her symptoms.  She has been prescribed allopurinol but did not know that this was something that she was supposed to take daily; however, she did take 1 tablet yesterday afternoon thinking that it may help with her current flare.   The following portions of the patient's history were reviewed and updated as appropriate: allergies, current medications, past family history, past medical history, past social history, past surgical history, and problem list.        Past Medical History:  Diagnosis Date   Anxiety    GERD (gastroesophageal reflux disease)    Glucose intolerance (impaired glucose tolerance)    Gout    Heart murmur    Hiatal hernia    Hypertension    Hypothyroidism    Sleep apnea    no longer using CPAP    Patient Active Problem List   Diagnosis Date Noted   Minimal CAD on Cardiac Catheterization 03/2016 03/15/2016   Numbness and tingling in left arm 02/17/2016   Chest pain with low risk for cardiac etiology 02/05/2016   Gastroesophageal reflux disease    Pain in joint, ankle and foot 05/01/2015   Healthcare maintenance 10/09/2014   Palpitations 10/09/2014   Bipolar II disorder (HCC) 01/30/2013   Generalized anxiety disorder 01/02/2013   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      Home Medications    Prior to Admission medications   Medication Sig Start Date End Date Taking? Authorizing Provider  allopurinol (ZYLOPRIM) 100 MG tablet Take 1 tablet (100 mg total) by mouth daily. 08/26/22 09/25/22 Yes Lurline Idol, FNP  HYDROcodone-acetaminophen (NORCO/VICODIN) 5-325 MG tablet Take 1-2 tablets by mouth every 6 (six) hours as needed for severe pain. 08/26/22  Yes Lurline Idol, FNP  methylPREDNISolone (MEDROL DOSEPAK) 4 MG TBPK tablet Take as directed. START ON THE MORNING OF 08/27/22 08/26/22  Yes Lurline Idol, FNP  naproxen (NAPROSYN) 500 MG tablet Take 1 tablet (500 mg total) by mouth 2 (two) times daily. 08/26/22  Yes Lurline Idol, FNP  atenolol (TENORMIN) 50 MG tablet Take 1 tablet (50 mg total) by mouth 2 (two) times daily. 12/28/18   Hoy Register, MD  cyclobenzaprine (FLEXERIL) 10 MG tablet  Take 1 tablet (10 mg total) by mouth 2 (two) times daily as needed for muscle spasms. 06/04/21   Piontek, Denny Peon, MD  hydrOXYzine (ATARAX/VISTARIL) 25 MG tablet Take 1 tablet (25 mg total) by mouth every 8 (eight) hours as needed. 11/23/18   Hoy Register, MD  levothyroxine (SYNTHROID) 125 MCG tablet Take 2 tablets (250 mcg total) by mouth daily before breakfast. 11/24/18   Hoy Register, MD  nitroGLYCERIN (NITROSTAT) 0.4 MG SL tablet Place 1 tablet (0.4 mg total) under the tongue every 5 (five)  minutes as needed. 02/24/16   Tereso Newcomer T, PA-C  omeprazole (PRILOSEC) 40 MG capsule Take 1 capsule (40 mg total) by mouth daily. 09/23/17   Eustace Moore, MD  PARoxetine (PAXIL) 40 MG tablet Take 1 tablet (40 mg total) by mouth daily. 11/23/18   Hoy Register, MD  atorvastatin (LIPITOR) 20 MG tablet Take 1 tablet (20 mg total) by mouth daily. Patient not taking: Reported on 11/13/2019 11/24/18 11/13/19  Hoy Register, MD    Family History Family History  Problem Relation Age of Onset   Diabetes Other    Hypertension Other    CAD Other    Heart attack Father        died of MI age 30   Coronary artery disease Brother     Social History Social History   Tobacco Use   Smoking status: Former   Smokeless tobacco: Never  Building services engineer Use: Never used  Substance Use Topics   Alcohol use: Yes    Comment: occ   Drug use: No     Allergies   Patient has no known allergies.   Review of Systems Review of Systems  Constitutional:  Negative for fever.  Musculoskeletal:  Positive for arthralgias, gait problem and joint swelling.  All other systems reviewed and are negative.    Physical Exam Triage Vital Signs ED Triage Vitals  Enc Vitals Group     BP 08/26/22 1542 (!) 162/97     Pulse Rate 08/26/22 1542 63     Resp 08/26/22 1542 16     Temp 08/26/22 1542 (!) 97.5 F (36.4 C)     Temp Source 08/26/22 1542 Oral     SpO2 08/26/22 1542 94 %     Weight --      Height --      Head Circumference --      Peak Flow --      Pain Score 08/26/22 1544 10     Pain Loc --      Pain Edu? --      Excl. in GC? --    No data found.  Updated Vital Signs BP (!) 162/97 (BP Location: Right Arm)   Pulse 63   Temp (!) 97.5 F (36.4 C) (Oral)   Resp 16   SpO2 94%   Visual Acuity Right Eye Distance:   Left Eye Distance:   Bilateral Distance:    Right Eye Near:   Left Eye Near:    Bilateral Near:     Physical Exam Vitals reviewed.  Constitutional:       Appearance: Normal appearance.  HENT:     Head: Normocephalic.     Mouth/Throat:     Mouth: Mucous membranes are moist.  Pulmonary:     Effort: Pulmonary effort is normal.  Musculoskeletal:        General: Normal range of motion.     Cervical back: Normal range of motion and neck  supple.     Right foot: Normal range of motion and normal capillary refill. Swelling and bony tenderness present. No deformity, bunion, Charcot foot, foot drop or prominent metatarsal heads. Normal pulse.     Left foot: Normal.  Feet:     Right foot:     Skin integrity: Erythema and warmth present. No ulcer, blister or skin breakdown.     Toenail Condition: Right toenails are normal.  Skin:    General: Skin is warm and dry.  Neurological:     General: No focal deficit present.     Mental Status: She is alert and oriented to person, place, and time.      UC Treatments / Results  Labs (all labs ordered are listed, but only abnormal results are displayed) Labs Reviewed - No data to display  EKG   Radiology No results found.  Procedures Procedures (including critical care time)  Medications Ordered in UC Medications  dexamethasone (DECADRON) injection 10 mg (10 mg Intramuscular Given 08/26/22 1725)  ketorolac (TORADOL) injection 60 mg (60 mg Intramuscular Given 08/26/22 1724)    Initial Impression / Assessment and Plan / UC Course  I have reviewed the triage vital signs and the nursing notes.  Pertinent labs & imaging results that were available during my care of the patient were reviewed by me and considered in my medical decision making (see chart for details).    72 year old female with history of gout presenting with an acute flare to the right foot.  Patient is afebrile.  Nontoxic but very uncomfortable.  Toradol and Decadron injections given in the clinic.  Patient discharged with steroid Dosepak, allopurinol, naproxen and short course of Norco. Education about gout causes and treatment  discussed. Follow up with primary in 1 week if symptoms fail to improve.  Today's evaluation has revealed no signs of a dangerous process. Discussed diagnosis with patient and/or guardian. Patient and/or guardian aware of their diagnosis, possible red flag symptoms to watch out for and need for close follow up. Patient and/or guardian understands verbal and written discharge instructions. Patient and/or guardian comfortable with plan and disposition.  Patient and/or guardian has a clear mental status at this time, good insight into illness (after discussion and teaching) and has clear judgment to make decisions regarding their care  Documentation was completed with the aid of voice recognition software. Transcription may contain typographical errors. Final Clinical Impressions(s) / UC Diagnoses   Final diagnoses:  Acute gout of right foot, unspecified cause   Discharge Instructions   None    ED Prescriptions     Medication Sig Dispense Auth. Provider   allopurinol (ZYLOPRIM) 100 MG tablet Take 1 tablet (100 mg total) by mouth daily. 30 tablet Iley Deignan, Lelon Mast, FNP   methylPREDNISolone (MEDROL DOSEPAK) 4 MG TBPK tablet Take as directed. START ON THE MORNING OF 08/27/22 21 tablet Lurline Idol, FNP   naproxen (NAPROSYN) 500 MG tablet Take 1 tablet (500 mg total) by mouth 2 (two) times daily. 20 tablet Lurline Idol, FNP   HYDROcodone-acetaminophen (NORCO/VICODIN) 5-325 MG tablet Take 1-2 tablets by mouth every 6 (six) hours as needed for severe pain. 10 tablet Lurline Idol, FNP      I have reviewed the PDMP during this encounter.   Lurline Idol, [place] 08/26/22 1742

## 2022-11-16 ENCOUNTER — Other Ambulatory Visit: Payer: Self-pay | Admitting: Internal Medicine

## 2022-11-17 LAB — CBC
HCT: 44.2 % (ref 35.0–45.0)
Hemoglobin: 14.5 g/dL (ref 11.7–15.5)
MCH: 30.6 pg (ref 27.0–33.0)
MCHC: 32.8 g/dL (ref 32.0–36.0)
MCV: 93.2 fL (ref 80.0–100.0)
MPV: 9.5 fL (ref 7.5–12.5)
Platelets: 341 10*3/uL (ref 140–400)
RBC: 4.74 10*6/uL (ref 3.80–5.10)
RDW: 11.9 % (ref 11.0–15.0)
WBC: 4.7 10*3/uL (ref 3.8–10.8)

## 2022-11-17 LAB — LIPID PANEL
Cholesterol: 251 mg/dL — ABNORMAL HIGH (ref <200)
HDL: 60 mg/dL (ref >=50)
LDL Cholesterol (Calc): 154 mg/dL (calc) — ABNORMAL HIGH
Non-HDL Cholesterol (Calc): 191 mg/dL (calc) — ABNORMAL HIGH (ref <130)
Total CHOL/HDL Ratio: 4.2 (calc) (ref <5.0)
Triglycerides: 201 mg/dL — ABNORMAL HIGH (ref <150)

## 2022-11-17 LAB — COMPLETE METABOLIC PANEL WITH GFR
AG Ratio: 1.5 (calc) (ref 1.0–2.5)
ALT: 25 U/L (ref 6–29)
AST: 23 U/L (ref 10–35)
Albumin: 4.5 g/dL (ref 3.6–5.1)
Alkaline phosphatase (APISO): 65 U/L (ref 37–153)
BUN: 18 mg/dL (ref 7–25)
CO2: 25 mmol/L (ref 20–32)
Calcium: 9.6 mg/dL (ref 8.6–10.4)
Chloride: 97 mmol/L — ABNORMAL LOW (ref 98–110)
Creat: 0.82 mg/dL (ref 0.60–1.00)
Globulin: 3 g/dL (calc) (ref 1.9–3.7)
Glucose, Bld: 108 mg/dL — ABNORMAL HIGH (ref 65–99)
Potassium: 4.1 mmol/L (ref 3.5–5.3)
Sodium: 136 mmol/L (ref 135–146)
Total Bilirubin: 0.5 mg/dL (ref 0.2–1.2)
Total Protein: 7.5 g/dL (ref 6.1–8.1)
eGFR: 77 mL/min/{1.73_m2} (ref >=60)

## 2022-11-17 LAB — T4, FREE: Free T4: 1 ng/dL (ref 0.8–1.8)

## 2022-11-17 LAB — VITAMIN D 25 HYDROXY (VIT D DEFICIENCY, FRACTURES): Vit D, 25-Hydroxy: 16 ng/mL — ABNORMAL LOW (ref 30–100)

## 2022-11-17 LAB — TSH: TSH: 10.44 mIU/L — ABNORMAL HIGH (ref 0.40–4.50)

## 2023-02-23 ENCOUNTER — Ambulatory Visit (HOSPITAL_COMMUNITY)
Admission: EM | Admit: 2023-02-23 | Discharge: 2023-02-23 | Disposition: A | Discharge status: Home / Self Care | Payer: Medicare HMO

## 2023-02-23 ENCOUNTER — Encounter (HOSPITAL_COMMUNITY): Payer: Self-pay

## 2023-02-23 DIAGNOSIS — M109 Gout, unspecified: Secondary | ICD-10-CM

## 2023-02-23 MED ORDER — KETOROLAC TROMETHAMINE 60 MG/2ML IM SOLN
30.0000 mg | Freq: Once | INTRAMUSCULAR | Status: AC
  Administered 2023-02-23: 30 mg via INTRAMUSCULAR

## 2023-02-23 MED ORDER — HYDROCODONE-ACETAMINOPHEN 5-325 MG PO TABS: 2.0000 | ORAL_TABLET | ORAL | 0 refills | Status: DC | PRN

## 2023-02-23 MED ORDER — KETOROLAC TROMETHAMINE 30 MG/ML IJ SOLN
INTRAMUSCULAR | Status: AC
  Filled 2023-02-23: qty 1

## 2023-02-23 MED ORDER — PREDNISONE 20 MG PO TABS: 40.0000 mg | ORAL_TABLET | Freq: Every day | ORAL | 0 refills | Status: AC

## 2023-02-23 NOTE — ED Triage Notes (Signed)
Patient here today with c/o left foot pain since Sunday. Patient has a h/o gout. She has tried taking Tylenol and Mitigare with no relief.

## 2023-02-23 NOTE — ED Provider Notes (Signed)
[facility]    CSN: [id number] Arrival date & time: 02/23/23  1555      History   Chief Complaint Chief Complaint  Patient presents with   Foot Pain    HPI Cheryl Castro is a 72 y.o. female.   Patient presents to clinic for left great toe pain that started on Sunday.  She denies any injury, traumas, falls or twisting of the foot.  Reports the sheet is painful to the top of her foot and she has had trouble sleeping.  She has tried Mitigare, Tylenol and diclofenac for pain management, no relief.   She does drink a little beer sometimes, eats red meat and fish.  Reports she was trying to take the allopurinol, but that medication makes her nervous.  No fevers.   The history is provided by the patient and medical records.  Foot Pain    Past Medical History:  Diagnosis Date   Anxiety    GERD (gastroesophageal reflux disease)    Glucose intolerance (impaired glucose tolerance)    Gout    Heart murmur    Hiatal hernia    Hypertension    Hypothyroidism    Sleep apnea    no longer using CPAP    Patient Active Problem List   Diagnosis Date Noted   Minimal CAD on Cardiac Catheterization 03/2016 03/15/2016   Numbness and tingling in left arm 02/17/2016   Chest pain with low risk for cardiac etiology 02/05/2016   Gastroesophageal reflux disease    Pain in joint, ankle and foot 05/01/2015   Healthcare maintenance 10/09/2014   Palpitations 10/09/2014   Bipolar II disorder (HCC) 01/30/2013   Generalized anxiety disorder 01/02/2013   Reflux gastritis 11/22/2011   Hypothyroidism 04/10/2010   PELVIC PAIN, CHRONIC 04/02/2010   Obesity 02/25/2010   Depression 12/30/2009   Pruritus of skin 07/28/2009   BACK PAIN, CHRONIC 07/28/2009   Essential hypertension, benign 06/24/2009    Past Surgical History:  Procedure Laterality Date   CARDIAC CATHETERIZATION N/A 02/27/2016   Procedure: Left Heart Cath and Coronary Angiography;  Surgeon: Corky Crafts, MD;   Location: [facility] INVASIVE CV LAB;  Service: Cardiovascular;  Laterality: N/A;   CATARACT EXTRACTION Bilateral 10/10/2017    OB History   No obstetric history on file.      Home Medications    Prior to Admission medications   Medication Sig Start Date End Date Taking? Authorizing Provider  amLODipine (NORVASC) 5 MG tablet Take 5 mg by mouth daily. 11/16/22  Yes [provider]  HYDROcodone-acetaminophen (NORCO/VICODIN) 5-325 MG tablet Take 2 tablets by mouth every 4 (four) hours as needed. 02/23/23  Yes Rinaldo Ratel, [place] N, FNP  predniSONE (DELTASONE) 20 MG tablet Take 2 tablets (40 mg total) by mouth daily with breakfast for 5 days. 02/23/23 02/28/23 Yes Rinaldo Ratel, [place] N, FNP  allopurinol (ZYLOPRIM) 100 MG tablet Take 1 tablet (100 mg total) by mouth daily. 08/26/22 09/25/22  Lurline Idol, FNP  atenolol (TENORMIN) 50 MG tablet Take 1 tablet (50 mg total) by mouth 2 (two) times daily. 12/28/18   Hoy Register, MD  hydrOXYzine (ATARAX/VISTARIL) 25 MG tablet Take 1 tablet (25 mg total) by mouth every 8 (eight) hours as needed. 11/23/18   Hoy Register, MD  levothyroxine (SYNTHROID) 125 MCG tablet Take 2 tablets (250 mcg total) by mouth daily before breakfast. 11/24/18   Hoy Register, MD  nitroGLYCERIN (NITROSTAT) 0.4 MG SL tablet Place 1 tablet (0.4 mg total) under the tongue every 5 (  five) minutes as needed. 02/24/16   Tereso Newcomer T, PA-C  omeprazole (PRILOSEC) 40 MG capsule Take 1 capsule (40 mg total) by mouth daily. 09/23/17   Eustace Moore, MD  PARoxetine (PAXIL) 40 MG tablet Take 1 tablet (40 mg total) by mouth daily. 11/23/18   Hoy Register, MD  atorvastatin (LIPITOR) 20 MG tablet Take 1 tablet (20 mg total) by mouth daily. Patient not taking: Reported on 11/13/2019 11/24/18 11/13/19  Hoy Register, MD    Family History Family History  Problem Relation Age of Onset   Diabetes Other    Hypertension Other    CAD Other    Heart attack Father        died of MI  age 52   Coronary artery disease Brother     Social History Social History   Tobacco Use   Smoking status: Former   Smokeless tobacco: Never  Advertising account planner   Vaping status: Never Used  Substance Use Topics   Alcohol use: Yes    Comment: occ   Drug use: No     Allergies   Patient has no known allergies.   Review of Systems Review of Systems  Per HPI  Physical Exam Triage Vital Signs ED Triage Vitals  Encounter Vitals Group     BP 02/23/23 1654 (!) 170/100     Systolic BP Percentile --      Diastolic BP Percentile --      Pulse Rate 02/23/23 1654 70     Resp 02/23/23 1654 16     Temp 02/23/23 1654 98.4 F (36.9 C)     Temp Source 02/23/23 1654 Oral     SpO2 02/23/23 1654 95 %     Weight 02/23/23 1650 240 lb (108.9 kg)     Height 02/23/23 1650 5\' 5"  (1.651 m)     Head Circumference --      Peak Flow --      Pain Score 02/23/23 1650 9     Pain Loc --      Pain Education --      Exclude from Growth Chart --    No data found.  Updated Vital Signs BP (!) 170/100 (BP Location: Right Arm)   Pulse 70   Temp 98.4 F (36.9 C) (Oral)   Resp 16   Ht 5\' 5"  (1.651 m)   Wt 240 lb (108.9 kg)   SpO2 95%   BMI 39.94 kg/m   Visual Acuity Right Eye Distance:   Left Eye Distance:   Bilateral Distance:    Right Eye Near:   Left Eye Near:    Bilateral Near:     Physical Exam Vitals and nursing note reviewed.  Constitutional:      Appearance: Normal appearance.  HENT:     Head: Normocephalic and atraumatic.     Right Ear: External ear normal.     Left Ear: External ear normal.     Nose: Nose normal.     Mouth/Throat:     Mouth: Mucous membranes are moist.  Eyes:     Conjunctiva/sclera: Conjunctivae normal.  Cardiovascular:     Rate and Rhythm: Normal rate.     Pulses: Normal pulses.  Pulmonary:     Effort: Pulmonary effort is normal. No respiratory distress.  Musculoskeletal:        General: Normal range of motion.  Skin:    General: Skin is warm and  dry.     Findings: Erythema present.  Neurological:  General: No focal deficit present.     Mental Status: She is alert and oriented to person, place, and time.  Psychiatric:        Mood and Affect: Mood normal.        Behavior: Behavior normal. Behavior is cooperative.      UC Treatments / Results  Labs (all labs ordered are listed, but only abnormal results are displayed) Labs Reviewed - No data to display  EKG   Radiology No results found.  Procedures Procedures (including critical care time)  Medications Ordered in UC Medications  ketorolac (TORADOL) injection 30 mg (has no administration in time range)    Initial Impression / Assessment and Plan / UC Course  I have reviewed the triage vital signs and the nursing notes.  Pertinent labs & imaging results that were available during my care of the patient were reviewed by me and considered in my medical decision making (see chart for details).  Vitals and triage reviewed, patient is hemodynamically stable.  Left great toe with erythema, warmth and tenderness to palpation.  Brisk capillary refill, pedal pulses are 2+.  Atraumatic.  Imaging deferred.  Suspect gout flareup, treated with IM Toradol, steroid burst and hydrocodone as needed for breakthrough pain.  Encouraged to follow-up with PCP regarding allopurinol use.  Low purine diet discussed.  Plan of care, follow-up care return precautions given, no questions at this time.     Final Clinical Impressions(s) / UC Diagnoses   Final diagnoses:  Acute gout of left foot, unspecified cause     Discharge Instructions      You appear to be having a flareup of your gout.  We have given you Toradol in clinic to help with your pain and inflammation.  You can start the prednisone tomorrow morning with breakfast for the next 5 days.  Take the Norco sparingly for breakthrough pain, do not drink or drive on this medication as it may cause drowsiness.  It also may cause  constipation, ensure you are staying physically active and walking frequently.   I have attached information for a low purine eating plan.  Please follow-up with your primary care provider to discuss if you are a good candidate for allopurinol.  Return to clinic for any new or urgent symptoms.      ED Prescriptions     Medication Sig Dispense Auth. Provider   HYDROcodone-acetaminophen (NORCO/VICODIN) 5-325 MG tablet Take 2 tablets by mouth every 4 (four) hours as needed. 10 tablet Rinaldo Ratel, [place] N, [place]   predniSONE (DELTASONE) 20 MG tablet Take 2 tablets (40 mg total) by mouth daily with breakfast for 5 days. 10 tablet Mirabel Ahlgren, [place], [place]      I have reviewed the PDMP during this encounter.   Yessenia Maillet, [place] N, [place] 02/23/23 [phone]

## 2023-02-23 NOTE — Discharge Instructions (Signed)
You appear to be having a flareup of your gout.  We have given you Toradol in clinic to help with your pain and inflammation.  You can start the prednisone tomorrow morning with breakfast for the next 5 days.  Take the Norco sparingly for breakthrough pain, do not drink or drive on this medication as it may cause drowsiness.  It also may cause constipation, ensure you are staying physically active and walking frequently.   I have attached information for a low purine eating plan.  Please follow-up with your primary care provider to discuss if you are a good candidate for allopurinol.  Return to clinic for any new or urgent symptoms.

## 2023-05-19 ENCOUNTER — Emergency Department (HOSPITAL_COMMUNITY)
Admission: EM | Admit: 2023-05-19 | Discharge: 2023-05-20 | Disposition: A | Discharge status: Home / Self Care | Payer: Medicare HMO | Attending: Emergency Medicine | Admitting: Emergency Medicine

## 2023-05-19 ENCOUNTER — Ambulatory Visit (HOSPITAL_COMMUNITY)
Admission: EM | Admit: 2023-05-19 | Discharge: 2023-05-19 | Disposition: A | Discharge status: Home / Self Care | Payer: Medicare HMO | Attending: Family Medicine | Admitting: Family Medicine

## 2023-05-19 ENCOUNTER — Other Ambulatory Visit: Payer: Self-pay

## 2023-05-19 ENCOUNTER — Encounter (HOSPITAL_COMMUNITY): Payer: Self-pay

## 2023-05-19 ENCOUNTER — Emergency Department (HOSPITAL_COMMUNITY): Payer: Medicare HMO

## 2023-05-19 DIAGNOSIS — Z87891 Personal history of nicotine dependence: Secondary | ICD-10-CM | POA: Insufficient documentation

## 2023-05-19 DIAGNOSIS — R079 Chest pain, unspecified: Secondary | ICD-10-CM | POA: Insufficient documentation

## 2023-05-19 DIAGNOSIS — R42 Dizziness and giddiness: Secondary | ICD-10-CM | POA: Diagnosis not present

## 2023-05-19 DIAGNOSIS — R112 Nausea with vomiting, unspecified: Secondary | ICD-10-CM | POA: Diagnosis not present

## 2023-05-19 DIAGNOSIS — E039 Hypothyroidism, unspecified: Secondary | ICD-10-CM | POA: Diagnosis not present

## 2023-05-19 DIAGNOSIS — I10 Essential (primary) hypertension: Secondary | ICD-10-CM | POA: Diagnosis not present

## 2023-05-19 LAB — BASIC METABOLIC PANEL
Anion gap: 12 (ref 5–15)
BUN: 15 mg/dL (ref 8–23)
CO2: 23 mmol/L (ref 22–32)
Calcium: 9.7 mg/dL (ref 8.9–10.3)
Chloride: 97 mmol/L — ABNORMAL LOW (ref 98–111)
Creatinine, Ser: 0.84 mg/dL (ref 0.44–1.00)
GFR, Estimated: >60 mL/min (ref >60)
Glucose, Bld: 100 mg/dL — ABNORMAL HIGH (ref 70–99)
Potassium: 3.9 mmol/L (ref 3.5–5.1)
Sodium: 132 mmol/L — ABNORMAL LOW (ref 135–145)

## 2023-05-19 LAB — TROPONIN I (HIGH SENSITIVITY)
Troponin I (High Sensitivity): 4 ng/L (ref <18)
Troponin I (High Sensitivity): 4 ng/L (ref <18)

## 2023-05-19 LAB — CBC
HCT: 42.3 % (ref 36.0–46.0)
Hemoglobin: 14.3 g/dL (ref 12.0–15.0)
MCH: 31.4 pg (ref 26.0–34.0)
MCHC: 33.8 g/dL (ref 30.0–36.0)
MCV: 92.8 fL (ref 80.0–100.0)
Platelets: 329 10*3/uL (ref 150–400)
RBC: 4.56 MIL/uL (ref 3.87–5.11)
RDW: 12.5 % (ref 11.5–15.5)
WBC: 5.7 10*3/uL (ref 4.0–10.5)
nRBC: 0 % (ref 0.0–0.2)

## 2023-05-19 NOTE — ED Notes (Signed)
Patient is being discharged from the Urgent Care and sent to the Emergency Department via personal opperated vehicle . Per Dr Haynes Bast, patient is in need of higher level of care due to Chest pain. Patient is aware and verbalizes understanding of plan of care.  Vitals:   05/19/23 1455  BP: (!) 189/95  Pulse: 66  Resp: 20  Temp: 98.5 F (36.9 C)  SpO2: 98%

## 2023-05-19 NOTE — ED Provider Triage Note (Signed)
Emergency Medicine Provider Triage Evaluation Note  Cheryl Castro , a 73 y.o. female  was evaluated in triage.  Pt complains of complains of chest pain.  Sent in from urgent care.  She is have been having intermittent chest pain, worsened last night.  She developed vertigo symptoms with nausea and diaphoresis.  Patient states that she has vertigo in the past.  Urgent care sent her here for cardiac evaluation.  She denies any vertigo or neurosymptoms at this time.  Review of Systems  Positive: Chest pain Negative: Fever  Physical Exam  BP (!) 185/105 (BP Location: Right Arm)   Pulse 69   Temp 98.7 F (37.1 C)   Resp 18   SpO2 96%  Gen:   Awake, no distress   Resp:  Normal effort  MSK:   Moves extremities without difficulty  Other:    Medical Decision Making  Medically screening exam initiated at 3:51 PM.  Appropriate orders placed.  Cheryl Castro was informed that the remainder of the evaluation will be completed by another provider, this initial triage assessment does not replace that evaluation, and the importance of remaining in the ED until their evaluation is complete.     Arthor Captain, PA-C 05/19/23 1552

## 2023-05-19 NOTE — ED Notes (Signed)
Provider at bedside

## 2023-05-19 NOTE — ED Triage Notes (Signed)
Pt c.o left sided chest pain that radiates to her neck and shoulder. Pt also c.o dizzy spell last night with left sided headache and nausea. Pt took a meclizine and it helped some but pt still having chest pain.

## 2023-05-19 NOTE — ED Provider Notes (Signed)
Patient is here today for left sided chest pain x 1 week that comes and goes in intensity,  along with pain that radiates to the neck and down the arm.   Last night she had episodes of dizziness with nausea and sweating.  She does have vertigo and may have been similar.  She has some sob at times.  She has h/o HTN, last cath done 2017.   EKG appears normal today.  Patient is talking in full sentences.  VSS.   Given her history I have recommended she go to the ER for further evaluation.  Her husband will take her now.    Jannifer Franklin, MD 05/19/23 [phone]

## 2023-05-19 NOTE — ED Triage Notes (Signed)
Pt states has had lt sided chest pain  radiating up to lt shoulder and lt arm x1 wk. States last night developed vomiting, dizziness, and pounding feeling in her chest.

## 2023-05-20 NOTE — Discharge Instructions (Signed)
You were evaluated in the Emergency Department and after careful evaluation, we did not find any emergent condition requiring admission or further testing in the hospital.  Your exam/testing today was overall reassuring.  Symptoms may be related to a shoulder issue.  Can use Tylenol at home for the pain.  Recommend follow-up with your primary care doctor or cardiology to further discuss your symptoms.  Please return to the Emergency Department if you experience any worsening of your condition.  Thank you for allowing [place] to be a part of your care.

## 2023-05-20 NOTE — ED Provider Notes (Addendum)
[facility] DEPT [facility] Emergency Department Provider Note MRN:  [id number]  Arrival date & time: 05/20/23     Chief Complaint   Chest Pain   History of Present Illness   Cheryl Castro is a 73 y.o. year-old female with a history of hypertension presenting to the ED with chief complaint of chest pain.  Intermittently for the past 2 weeks patient has been having pain to the left lateral neck, left upper chest, left shoulder.  Happens more when standing up or changing positions.  Has also been having intermittent dizziness that she describes as vertigo.  The dizziness responds to meclizine at home.  No shortness of breath, no leg pain or swelling.  Review of Systems  A thorough review of systems was obtained and all systems are negative except as noted in the HPI and PMH.   Patient's Health History    Past Medical History:  Diagnosis Date   Anxiety    GERD (gastroesophageal reflux disease)    Glucose intolerance (impaired glucose tolerance)    Gout    Heart murmur    Hiatal hernia    Hypertension    Hypothyroidism    Sleep apnea    no longer using CPAP    Past Surgical History:  Procedure Laterality Date   CARDIAC CATHETERIZATION N/A 02/27/2016   Procedure: Left Heart Cath and Coronary Angiography;  Surgeon: Corky Crafts, MD;  Location: [facility] INVASIVE CV LAB;  Service: Cardiovascular;  Laterality: N/A;   CATARACT EXTRACTION Bilateral 10/10/2017    Family History  Problem Relation Age of Onset   Diabetes Other    Hypertension Other    CAD Other    Heart attack Father        died of MI age 15   Coronary artery disease Brother     Social History   Socioeconomic History   Marital status: Married    Spouse name: Not on file   Number of children: Not on file   Years of education: Not on file   Highest education level: Not on file  Occupational History   Not on file  Tobacco Use   Smoking status: Former   Smokeless tobacco: Never  Vaping Use    Vaping status: Never Used  Substance and Sexual Activity   Alcohol use: Yes    Comment: occ   Drug use: No   Sexual activity: Not on file  Other Topics Concern   Not on file  Social History Narrative   Married   3 sons   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   Social Drivers of Corporate investment banker Strain: Not on file  Food Insecurity: Not on file  Transportation Needs: Not on file  Physical Activity: Not on file  Stress: Not on file  Social Connections: Not on file  Intimate Partner Violence: Not on file     Physical Exam   Vitals:   05/19/23 1841 05/19/23 2223  BP: (!) 180/86 (!) 172/100  Pulse: 64 67  Resp: 18 16  Temp: 98.2 F (36.8 C) 98.7 F (37.1 C)  SpO2: 98% 93%    CONSTITUTIONAL: Well-appearing, NAD NEURO/PSYCH:  Alert and oriented x 3, no focal deficits EYES:  eyes equal and reactive ENT/NECK:  no LAD, no JVD CARDIO: Regular rate, well-perfused, normal S1 and S2 PULM:  CTAB no wheezing or rhonchi GI/GU:  non-distended, non-tender MSK/SPINE:  No gross deformities, no edema SKIN:  no rash, atraumatic   *Additional  and/or pertinent findings included in MDM below  Diagnostic and Interventional Summary    EKG Interpretation Date/Time:  Thursday May 19 2023 15:34:04 EST Ventricular Rate:  65 PR Interval:  194 QRS Duration:  84 QT Interval:  430 QTC Calculation: 447 R Axis:   13  Text Interpretation: Normal sinus rhythm Minimal voltage criteria for LVH, may be normal variant ( R in aVL ) Borderline ECG When compared with ECG of 19-May-2023 15:05, PREVIOUS ECG IS PRESENT Confirmed by Kennis Carina [phone]) on 05/20/2023 12:42:08 AM       Labs Reviewed  BASIC METABOLIC PANEL - Abnormal; Notable for the following components:      Result Value   Sodium 132 (*)    Chloride 97 (*)    Glucose, Bld 100 (*)    All other components within normal limits  CBC  TROPONIN I (HIGH SENSITIVITY)  TROPONIN I (HIGH SENSITIVITY)     DG Chest 1 View  Final Result      Medications - No data to display   Procedures  /  Critical Care Procedures  ED Course and Medical Decision Making  Initial Impression and Ddx Atypical chest pain favored MSK.  Has had shoulder issues in the past.  Currently without pain.  Not having any shortness of breath, no leg pain or swelling, doubt PE.  ACS is considered.  History of hypertension but otherwise no significant cardiovascular risk factors.  Had a reassuring catheterization of the heart back in 2017.  Past medical/surgical history that increases complexity of ED encounter: Hypertension  Interpretation of Diagnostics I personally reviewed the EKG and my interpretation is as follows: Sinus rhythm without concerning ischemic features  Labs reassuring with no significant blood count or electrolyte disturbance, troponin negative x 2  Patient Reassessment and Ultimate Disposition/Management     Discharge.  Patient management required discussion with the following services or consulting groups:  None  Complexity of Problems Addressed Acute illness or injury that poses threat of life of bodily function  Additional Data Reviewed and Analyzed Further history obtained from: Further history from spouse/family member  Additional Factors Impacting ED Encounter Risk Consideration of hospitalization  Elmer Sow. Pilar Plate, MD [facility] Emergency Medicine [facility] [email or web address]  Final Clinical Impressions(s) / ED Diagnoses     ICD-10-CM   1. Chest pain, unspecified type  R07.9       ED Discharge Orders     None        Discharge Instructions Discussed with and Provided to Patient:   Discharge Instructions      You were evaluated in the Emergency Department and after careful evaluation, we did not find any emergent condition requiring admission or further testing in the hospital.  Your exam/testing today was overall reassuring.  Symptoms may be  related to a shoulder issue.  Can use Tylenol at home for the pain.  Recommend follow-up with your primary care doctor or cardiology to further discuss your symptoms.  Please return to the Emergency Department if you experience any worsening of your condition.  Thank you for allowing [place] to be a part of your care.       Sabas Sous, MD 05/20/23 1324    Sabas Sous, MD 05/20/23 [phone]

## 2023-12-01 IMAGING — MG DIGITAL DIAGNOSTIC BILAT W/ TOMO W/ CAD
6 of 12 series · 6 of 36 positions shown · non-contrast
Comparison: Previous exam(s).

ACR Breast Density Category a: The breast tissue is almost entirely
fatty.

CLINICAL DATA: Palpable abnormality in the UPPER-OUTER QUADRANT of
the RIGHT breast. Patient denies any known trauma to the breast.

EXAM:
DIGITAL DIAGNOSTIC BILATERAL MAMMOGRAM WITH TOMOSYNTHESIS AND CAD;
ULTRASOUND RIGHT BREAST LIMITED
TECHNIQUE: Bilateral digital diagnostic mammography and breast tomosynthesis
was performed. The images were evaluated with computer-aided
detection.; Targeted ultrasound examination of the right breast was
performed

[R MLO synth-2D (1 of 2)]
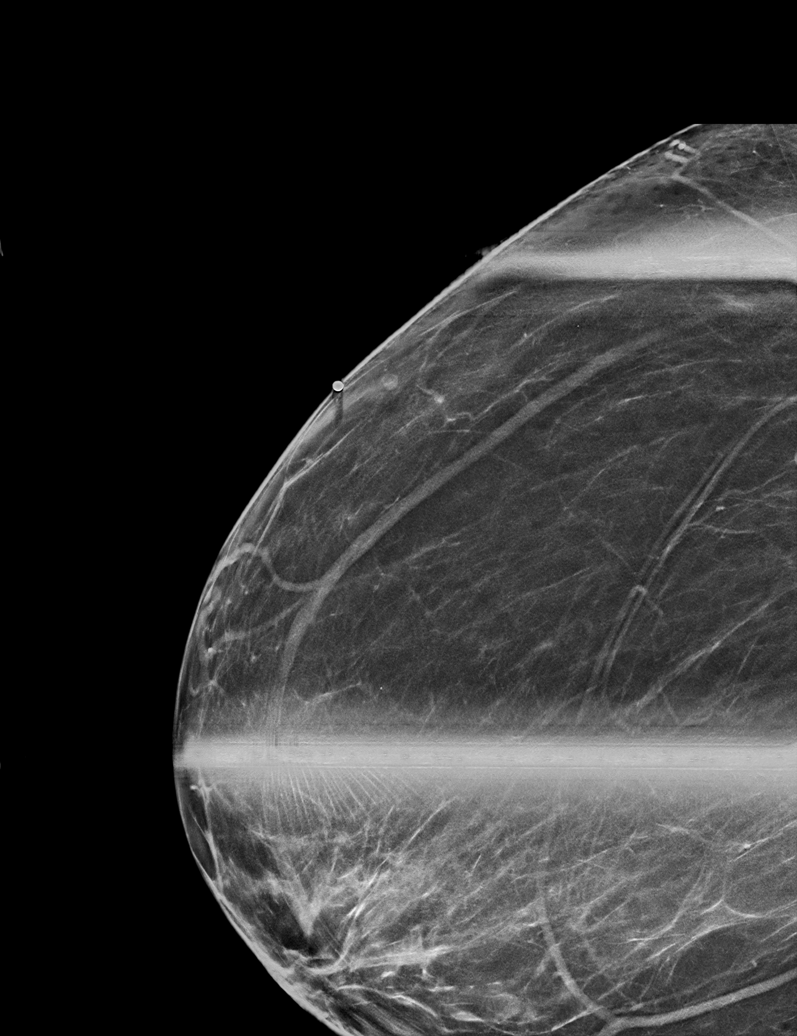

[L MLO synth-2D]
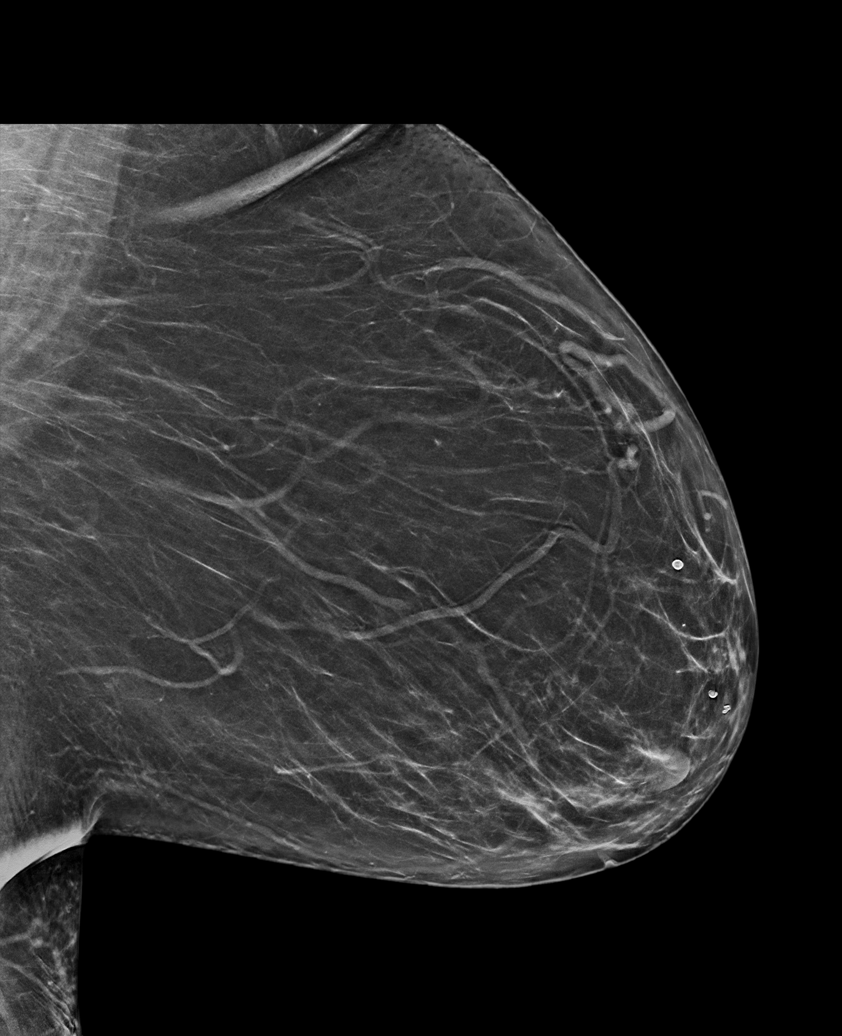

[R MLO synth-2D (2 of 2)]
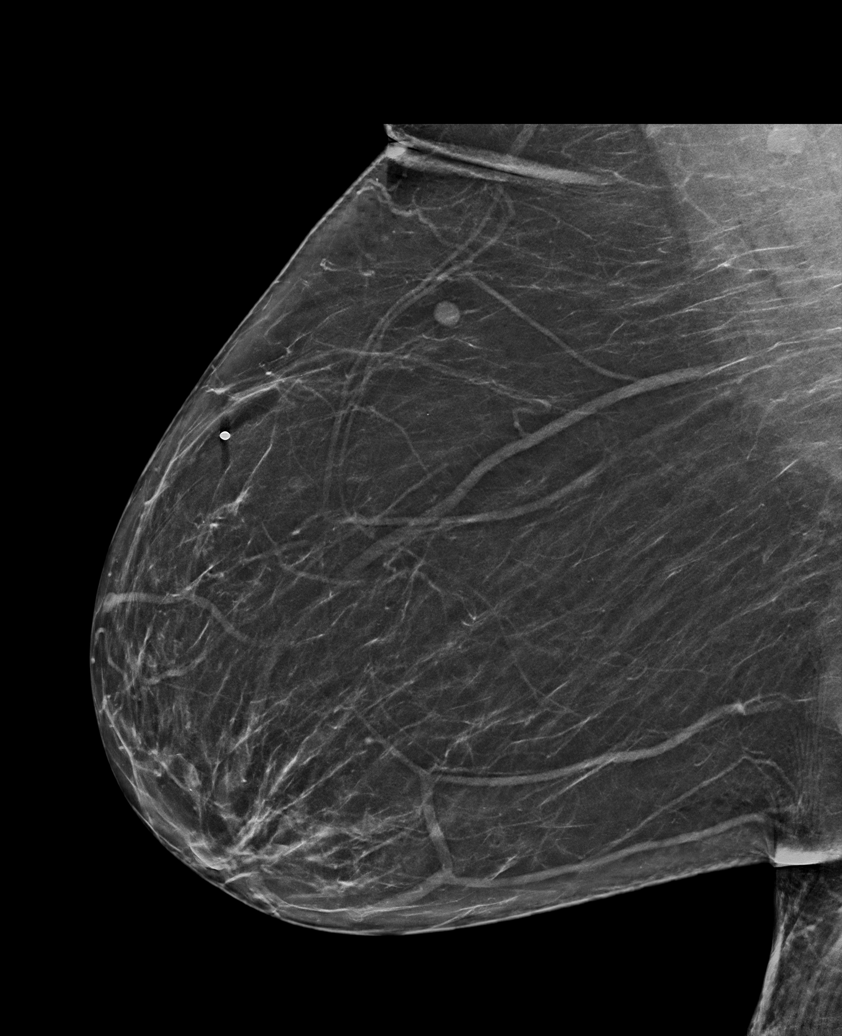

[L CC synth-2D]
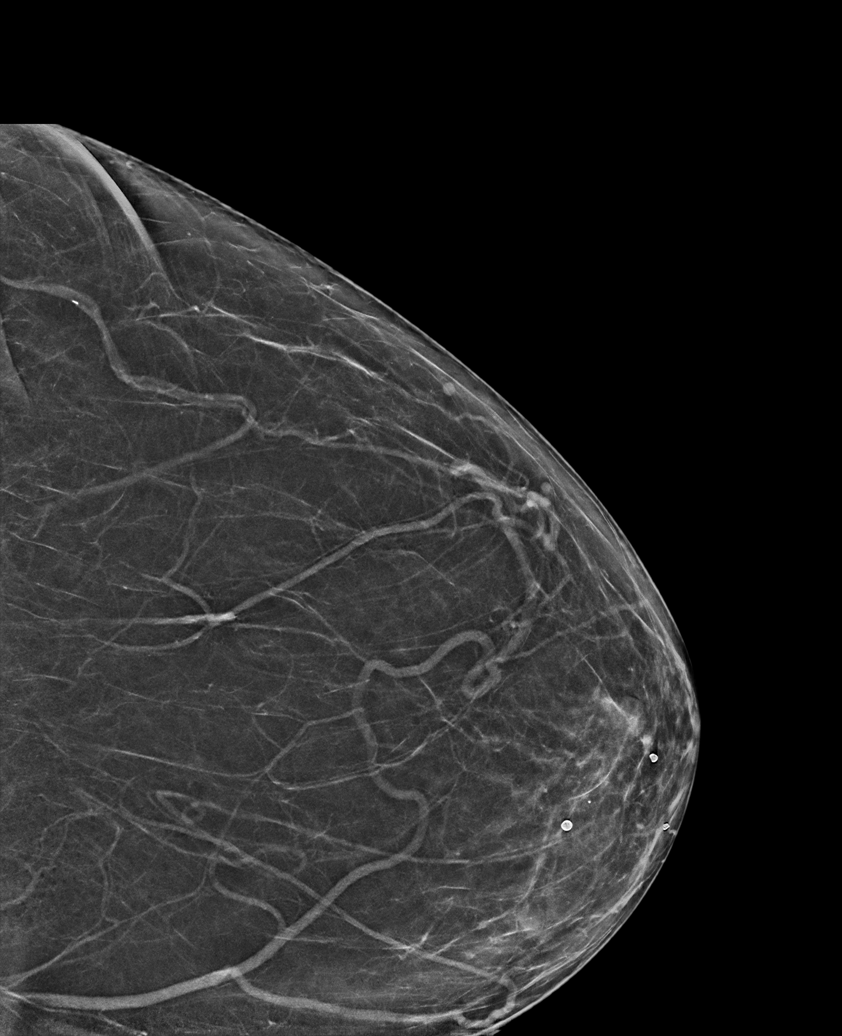

[R CC synth-2D]
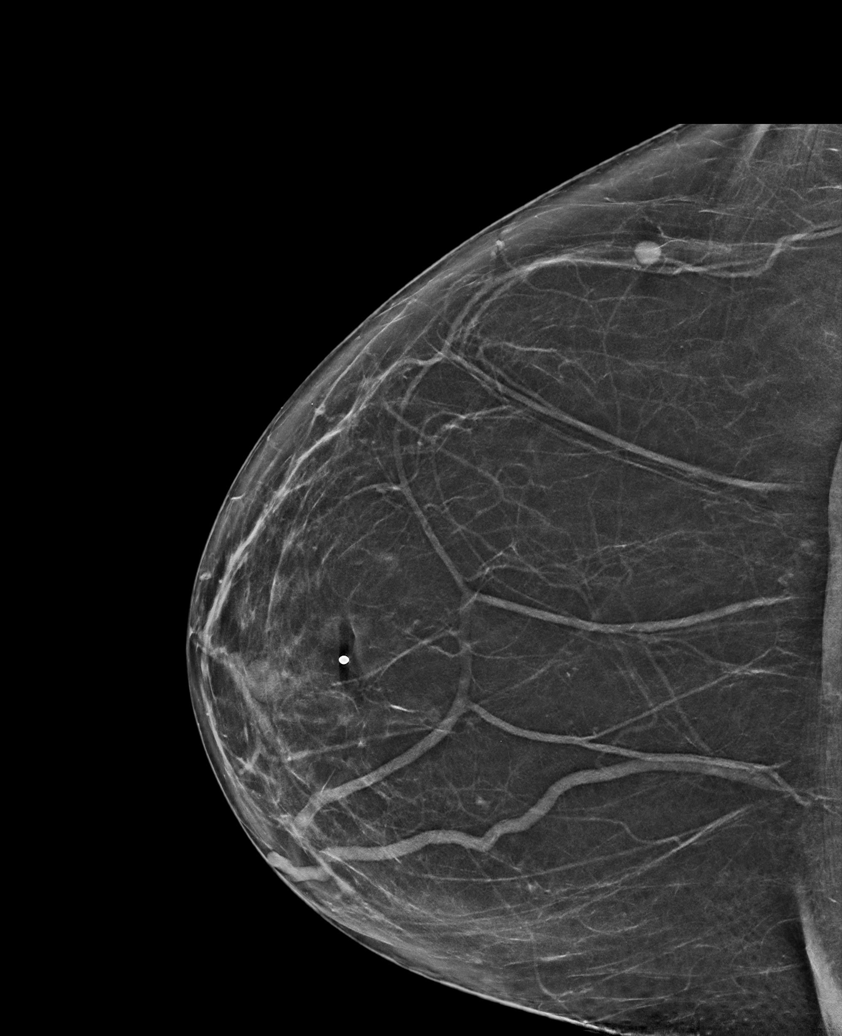

[R CV synth-2D]
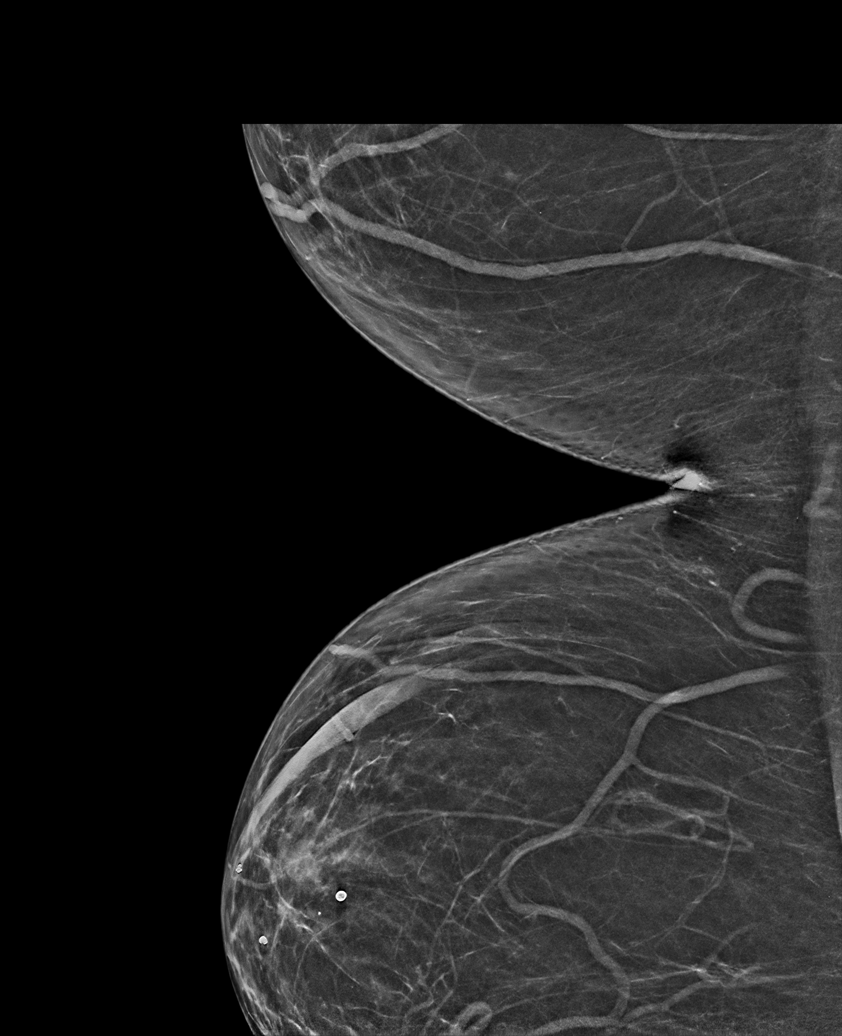

[6 of 36 positions shown; findings below may reference images not displayed]

FINDINGS: LEFT breast is negative.

Within the superior portion of the RIGHT breast, there is a
superficial oil cyst measuring 3 millimeters in diameter and marked
with a BB as palpable. No suspicious findings in the RIGHT breast.

On physical exam, I palpate a discrete superficial BB-sized mass in
the area of concern, 12:30 o'clock location.

Targeted ultrasound is performed, showing a superficial hypoechoic
mass measuring 0.3 x 0.3 x 0.3 centimeters in the 12:30 o'clock
location of the RIGHT breast 9 centimeters from the nipple. Findings
are consistent with benign oil cyst.
IMPRESSION: No mammographic or ultrasound evidence for malignancy. Palpable
abnormality is a benign oil cyst.

RECOMMENDATION:
Screening mammogram in one year.(Code:[id number]-[id number])

I have discussed the findings and recommendations with the patient.
If applicable, a reminder letter will be sent to the patient
regarding the next appointment.

BI-RADS CATEGORY  2: Benign.

## 2024-06-05 ENCOUNTER — Encounter (HOSPITAL_COMMUNITY): Payer: Self-pay

## 2024-06-05 ENCOUNTER — Ambulatory Visit (HOSPITAL_COMMUNITY)
Admission: EM | Admit: 2024-06-05 | Discharge: 2024-06-05 | Disposition: A | Discharge status: Home / Self Care | Source: Home / Self Care

## 2024-06-05 DIAGNOSIS — M79672 Pain in left foot: Secondary | ICD-10-CM | POA: Diagnosis not present

## 2024-06-05 MED ORDER — TRAMADOL HCL 50 MG PO TABS: 50.0000 mg | ORAL_TABLET | Freq: Two times a day (BID) | ORAL | 0 refills | Status: AC | PRN

## 2024-06-05 MED ORDER — ACETAMINOPHEN 325 MG PO TABS
ORAL_TABLET | ORAL | Status: AC
  Filled 2024-06-05: qty 3

## 2024-06-05 MED ORDER — ACETAMINOPHEN 325 MG PO TABS
975.0000 mg | ORAL_TABLET | Freq: Once | ORAL | Status: AC
  Administered 2024-06-05: 975 mg via ORAL

## 2024-06-05 MED ORDER — CEPHALEXIN 500 MG PO CAPS: 500.0000 mg | ORAL_CAPSULE | Freq: Four times a day (QID) | ORAL | 0 refills | Status: AC

## 2024-06-05 MED ORDER — METHYLPREDNISOLONE 4 MG PO TBPK: ORAL_TABLET | ORAL | 0 refills | Status: AC

## 2024-06-05 NOTE — ED Triage Notes (Signed)
 Patient reports that she began having pain and swelling on the side of her left foot 3 days ago. Patient states the entire foot is now hurting and swollen. Patient reports a history of gout. Patient denies any injury to the left foot.  Patient states she has been taking Allopurinol , ibuprofen , and a gout medication.

## 2024-06-05 NOTE — Discharge Instructions (Signed)
 I am going to treat you with antibiotics to cover infection and prednisone  for inflammation. Please call your primary care doctor today to follow up with them on Friday.
